# Patient Record
Sex: Male | Born: 1950 | Hispanic: No | Marital: Single | Smoking: Former smoker
Health system: Southern US, Community
[De-identification: ages and names within clinical notes are randomized; demographics above are authoritative.]

## PROBLEM LIST (undated history)

## (undated) DIAGNOSIS — R112 Nausea with vomiting, unspecified: Secondary | ICD-10-CM

## (undated) DIAGNOSIS — K219 Gastro-esophageal reflux disease without esophagitis: Secondary | ICD-10-CM

## (undated) DIAGNOSIS — K759 Inflammatory liver disease, unspecified: Secondary | ICD-10-CM

## (undated) DIAGNOSIS — R55 Syncope and collapse: Secondary | ICD-10-CM

## (undated) DIAGNOSIS — E785 Hyperlipidemia, unspecified: Secondary | ICD-10-CM

## (undated) DIAGNOSIS — R0602 Shortness of breath: Secondary | ICD-10-CM

## (undated) DIAGNOSIS — Z9889 Other specified postprocedural states: Secondary | ICD-10-CM

## (undated) DIAGNOSIS — J189 Pneumonia, unspecified organism: Secondary | ICD-10-CM

## (undated) DIAGNOSIS — T4145XA Adverse effect of unspecified anesthetic, initial encounter: Secondary | ICD-10-CM

## (undated) DIAGNOSIS — Z950 Presence of cardiac pacemaker: Secondary | ICD-10-CM

## (undated) DIAGNOSIS — I442 Atrioventricular block, complete: Secondary | ICD-10-CM

## (undated) DIAGNOSIS — I10 Essential (primary) hypertension: Secondary | ICD-10-CM

## (undated) DIAGNOSIS — IMO0002 Reserved for concepts with insufficient information to code with codable children: Secondary | ICD-10-CM

## (undated) DIAGNOSIS — F419 Anxiety disorder, unspecified: Secondary | ICD-10-CM

## (undated) HISTORY — PX: PACEMAKER INSERTION: SHX728

## (undated) HISTORY — DX: Atrioventricular block, complete: I44.2

## (undated) HISTORY — PX: BACK SURGERY: SHX140

## (undated) HISTORY — PX: OTHER SURGICAL HISTORY: SHX169

## (undated) HISTORY — DX: Anxiety disorder, unspecified: F41.9

## (undated) HISTORY — DX: Hyperlipidemia, unspecified: E78.5

## (undated) HISTORY — PX: ULNAR TUNNEL RELEASE: SHX820

## (undated) HISTORY — DX: Syncope and collapse: R55

## (undated) HISTORY — DX: Essential (primary) hypertension: I10

## (undated) HISTORY — DX: Reserved for concepts with insufficient information to code with codable children: IMO0002

## (undated) HISTORY — DX: Presence of cardiac pacemaker: Z95.0

---

## 1977-07-01 HISTORY — PX: CARPAL TUNNEL RELEASE: SHX101

## 1999-07-02 HISTORY — PX: CARDIAC CATHETERIZATION: SHX172

## 2006-01-08 ENCOUNTER — Ambulatory Visit: Payer: Self-pay | Admitting: Cardiology

## 2006-04-17 ENCOUNTER — Ambulatory Visit: Payer: Self-pay | Admitting: Cardiology

## 2006-05-15 ENCOUNTER — Ambulatory Visit: Payer: Self-pay | Admitting: Cardiology

## 2006-08-03 ENCOUNTER — Encounter: Payer: Self-pay | Admitting: Cardiology

## 2007-01-20 ENCOUNTER — Ambulatory Visit: Payer: Self-pay | Admitting: Cardiology

## 2007-05-22 ENCOUNTER — Ambulatory Visit: Payer: Self-pay | Admitting: Internal Medicine

## 2007-07-02 HISTORY — PX: SHOULDER ARTHROSCOPY DISTAL CLAVICLE EXCISION AND OPEN ROTATOR CUFF REPAIR: SHX2396

## 2007-11-04 ENCOUNTER — Ambulatory Visit: Payer: Self-pay

## 2007-12-11 ENCOUNTER — Encounter: Payer: Self-pay | Admitting: Cardiology

## 2007-12-11 ENCOUNTER — Ambulatory Visit: Payer: Self-pay | Admitting: Cardiology

## 2007-12-14 ENCOUNTER — Encounter: Admission: RE | Admit: 2007-12-14 | Discharge: 2007-12-14 | Payer: Self-pay | Admitting: Orthopedic Surgery

## 2008-02-24 ENCOUNTER — Encounter: Payer: Self-pay | Admitting: Cardiology

## 2008-03-03 ENCOUNTER — Ambulatory Visit: Payer: Self-pay | Admitting: Cardiology

## 2008-04-29 ENCOUNTER — Ambulatory Visit: Payer: Self-pay | Admitting: Internal Medicine

## 2008-07-01 HISTORY — PX: SHOULDER ARTHROSCOPY DISTAL CLAVICLE EXCISION AND OPEN ROTATOR CUFF REPAIR: SHX2396

## 2008-07-13 DIAGNOSIS — R55 Syncope and collapse: Secondary | ICD-10-CM

## 2008-07-13 DIAGNOSIS — Z950 Presence of cardiac pacemaker: Secondary | ICD-10-CM

## 2008-07-13 DIAGNOSIS — I447 Left bundle-branch block, unspecified: Secondary | ICD-10-CM

## 2008-07-13 DIAGNOSIS — E781 Pure hyperglyceridemia: Secondary | ICD-10-CM

## 2008-07-13 DIAGNOSIS — I10 Essential (primary) hypertension: Secondary | ICD-10-CM | POA: Insufficient documentation

## 2008-10-14 ENCOUNTER — Encounter (INDEPENDENT_AMBULATORY_CARE_PROVIDER_SITE_OTHER): Payer: Self-pay

## 2008-12-13 ENCOUNTER — Encounter: Payer: Self-pay | Admitting: Cardiology

## 2009-02-20 ENCOUNTER — Encounter (INDEPENDENT_AMBULATORY_CARE_PROVIDER_SITE_OTHER): Payer: Self-pay | Admitting: *Deleted

## 2009-02-24 ENCOUNTER — Ambulatory Visit: Payer: Self-pay | Admitting: Internal Medicine

## 2009-03-24 ENCOUNTER — Ambulatory Visit (HOSPITAL_COMMUNITY): Admission: RE | Admit: 2009-03-24 | Discharge: 2009-03-24 | Payer: Self-pay | Admitting: Neurosurgery

## 2009-05-02 ENCOUNTER — Inpatient Hospital Stay (HOSPITAL_COMMUNITY): Admission: RE | Admit: 2009-05-02 | Discharge: 2009-05-06 | Payer: Self-pay | Admitting: Neurosurgery

## 2009-06-29 ENCOUNTER — Encounter: Admission: RE | Admit: 2009-06-29 | Discharge: 2009-06-29 | Payer: Self-pay | Admitting: Orthopedic Surgery

## 2009-07-12 ENCOUNTER — Telehealth (INDEPENDENT_AMBULATORY_CARE_PROVIDER_SITE_OTHER): Payer: Self-pay | Admitting: *Deleted

## 2009-07-19 ENCOUNTER — Emergency Department (HOSPITAL_COMMUNITY): Admission: EM | Admit: 2009-07-19 | Discharge: 2009-07-19 | Payer: Self-pay | Admitting: Emergency Medicine

## 2009-09-22 ENCOUNTER — Ambulatory Visit: Payer: Self-pay | Admitting: Internal Medicine

## 2009-10-03 ENCOUNTER — Telehealth: Payer: Self-pay | Admitting: Internal Medicine

## 2010-03-23 ENCOUNTER — Ambulatory Visit: Payer: Self-pay | Admitting: Internal Medicine

## 2010-04-27 ENCOUNTER — Encounter (INDEPENDENT_AMBULATORY_CARE_PROVIDER_SITE_OTHER): Payer: Self-pay | Admitting: *Deleted

## 2010-07-21 ENCOUNTER — Encounter: Payer: Self-pay | Admitting: Cardiology

## 2010-07-22 ENCOUNTER — Encounter: Payer: Self-pay | Admitting: Orthopedic Surgery

## 2010-07-22 ENCOUNTER — Encounter: Payer: Self-pay | Admitting: Cardiology

## 2010-07-25 ENCOUNTER — Telehealth (INDEPENDENT_AMBULATORY_CARE_PROVIDER_SITE_OTHER): Payer: Self-pay | Admitting: *Deleted

## 2010-07-31 NOTE — Miscellaneous (Signed)
Summary: dx correction  Clinical Lists Changes  Problems: Changed problem from PACEMAKER (ICD-V45.Marland Kitchen01) to PACEMAKER, PERMANENT (ICD-V45.01) changed the incorrect dx code to correct dx code Genella Mech  April 27, 2010 12:27 PM

## 2010-07-31 NOTE — Cardiovascular Report (Signed)
Summary: [facility]/ INTERROGATION REPORT  Card Device Clinic/ INTERROGATION REPORT   Imported By: Dorise Hiss 09/25/2009 12:17:00  _____________________________________________________________________  External Attachment:    Type:   Image     Comment:   External Document

## 2010-07-31 NOTE — Progress Notes (Signed)
  Phone Note Other Incoming   Caller: Rosalita Chessman Reason for Call: Get patient information Action Taken: Information Sent Initial call taken by: Denny Peon    Faxed all Recent Cardiac Records [facility] fax [phone] [facility]  July 12, 2009 11:44 AM

## 2010-07-31 NOTE — Progress Notes (Signed)
Summary: problem with bp med  Phone Note Call from Patient   Summary of Call: BP reading after increase of Metoprolol done on 3/25 was 78/41 after one week of increased dose.  Did make him feel lightheaded when running this low.  Stopped taking extra half and BP reading running 133/90 to 115/75.  Currently BP 131/90.  HR 87    Initial call taken by: Hoover Brunette, LPN,  October 03, 2009 12:31 PM  Follow-up for Phone Call        Pt should resume 25mg  daily and follow-up with PCP. Follow-up by: Hillis Range, MD,  October 06, 2009 9:16 AM     Appended Document: problem with bp med Left message to return call.   Appended Document: problem with bp med Patient notified.   Pt. now states that the tablet was 100mg  tablet, thought is was a 50mg  tablet.  Now states his bp is 120/88.  HR 96.  Advised to cont. as currently doing and follow up with PMD as stated before.

## 2010-07-31 NOTE — Assessment & Plan Note (Signed)
Summary: PC2   Visit Type:  Pacemaker check Primary Provider:  Ernestine Conrad  CC:  pacer check.  History of Present Illness: The patient presents today for routine electrophysiology followup. He reports doing very well since last being seen in our clinic. His blood pressure has been elevated recently.  The patient denies symptoms of palpitations, chest pain, shortness of breath, orthopnea, PND, lower extremity edema, dizziness, presyncope, syncope, or neurologic sequela. The patient is tolerating medications without difficulties and is otherwise without complaint today.   Preventive Screening-Counseling & Management  Alcohol-Tobacco     Smoking Status: quit     Year Quit: 1979  Current Medications (verified): 1)  Lovaza 1 Gm Caps (Omega-3-Acid Ethyl Esters) .... Take 1 Capsule By Mouth Twice A Day 2)  Centrum Silver  Tabs (Multiple Vitamins-Minerals) .... Once Daily 3)  Welchol 625 Mg Tabs (Colesevelam Hcl) .... Three Tablets Two Times A Day 4)  Diazepam 10 Mg Tabs (Diazepam) .... Two Times A Day 5)  Prilosec 20 Mg Cpdr (Omeprazole) .... Once Daily 6)  Simvastatin 80 Mg Tabs (Simvastatin) .... Take One Tablet By Mouth Daily At Bedtime 7)  Lisinopril-Hydrochlorothiazide 20-25 Mg Tabs (Lisinopril-Hydrochlorothiazide) .... Once Daily 8)  Metoprolol Succinate 50 Mg Xr24h-Tab (Metoprolol Succinate) .... Take One-Half Tablet By Mouth Daily 9)  Albuterol Sulfate (2.5 Mg/78ml) 0.083% Nebu (Albuterol Sulfate) .... As Needed 10)  Ibuprofen 200 Mg Tabs (Ibuprofen) .... As Needed 11)  Paxil 20 Mg Tabs (Paroxetine Hcl) .... Take 1/2 Tablet By Mouth Once A Day 12)  Vitamin C 1000 Mg Tabs (Ascorbic Acid) .... Take 1 Tablet By Mouth Once A Day  Allergies (verified): 1)  ! Prednisone 2)  ! Codeine  Comments:  Nurse/Medical Assistant: The patient's medications and allergies were reviewed with the patient and were updated in the Medication and Allergy Lists. List reviewed.  Past History:  Past  Medical History: Complete heart block with prior syncope HYPERTENSION, BENIGN (ICD-401.1) PACEMAKER (ICD-V45.Marland Kitchen01)Medtronic Kappa 7/04 [place] Normal cath 2001 Anxiety Hyperlipidemia Remote tobacco DDD with multiple prior back surgeries  Past Surgical History: Reviewed history from 07/13/2008 and no changes required. cervical neck surgery  Social History: Reviewed history from 07/13/2008 and no changes required. Regular Exercise - yes Divorced  Tobacco Use - Former.  Alcohol Use - no  Review of Systems       All systems are reviewed and negative except as listed in the HPI.   Vital Signs:  Patient profile:   60 year old male Height:      65 inches Weight:      176 pounds BMI:     29.39 Pulse rate:   80 / minute BP sitting:   178 / 104  (left arm) Cuff size:   regular  Vitals Entered By: Carlye Grippe (September 22, 2009 2:25 PM)  Nutrition Counseling: Patient's BMI is greater than 25 and therefore counseled on weight management options. CC: pacer check   Physical Exam  General:  Well developed, well nourished, in no acute distress. Head:  normocephalic and atraumatic Mouth:  Teeth, gums and palate normal. Oral mucosa normal. Neck:  supple Chest Wall:  pacemaker site is well healed Lungs:  clear Heart:  RRR (paced) Abdomen:  Bowel sounds positive; abdomen soft and non-tender without masses, organomegaly, or hernias noted. No hepatosplenomegaly. Pulses:  pulses normal in all 4 extremities Extremities:  No clubbing or cyanosis. Neurologic:  nonfocal   PPM Specifications Following MD:  Sherryl Manges, MD     PPM Vendor:  Medtronic     PPM Model Number:  [id number]     PPM Serial Number:  [id number] PPM DOI:  01/07/2003     PPM Implanting MD:  Sherryl Manges, MD  Lead 1    Location: RA     DOI: 01/07/2003     Model #: [id number]     Serial #: [id number]     Status: active Lead 2    Location: RV     DOI: 01/07/2003     Model #: [id number]     Serial #: [id number]     Status:  active   Indications:  Syncope   PPM Follow Up Remote Check?  No Battery Voltage:  2.69 V     Battery Est. Longevity:  2 years     Pacer Dependent:  Yes       PPM Device Measurements Atrium  Amplitude: 2.0 mV, Impedance: 473 ohms, Threshold: 0.5 V at 0.4 msec Right Ventricle  Impedance: 580 ohms, Threshold: 1.0 V at 0.4 msec  Episodes MS Episodes:  0     Percent Mode Switch:  0     Coumadin:  No Ventricular High Rate:  0     Atrial Pacing:  6.4     Ventricular Pacing:  100%  Parameters Mode:  DDD     Lower Rate Limit:  60     Upper Rate Limit:  150 Paced AV Delay:  250     Sensed AV Delay:  140 Next Cardiology Appt Due:  03/01/2010 Tech Comments:  No parameter changes.  ROV 6 months in the [facility]. Altha Harm, LPN  September 22, 2009 3:02 PM  MD Comments:  agree  Impression & Recommendations:  Problem # 1:  AV BLOCK, COMPLETE (ICD-426.0) normal pacemaker function no changes  Problem # 2:  HYPERTENSION, BENIGN (ICD-401.1) above goal increase metoprolol to 50mg  daily follow-up with PCP  Problem # 3:  HYPERTRIGLYCERIDEMIA (ICD-272.1) stable His updated medication list for this problem includes:    Lovaza 1 Gm Caps (Omega-3-acid ethyl esters) .Marland Kitchen... Take 1 capsule by mouth twice a day    Welchol 625 Mg Tabs (Colesevelam hcl) .Marland Kitchen... Three tablets two times a day    Simvastatin 80 Mg Tabs (Simvastatin) .Marland Kitchen... Take one tablet by mouth daily at bedtime  Patient Instructions: 1)  return in 6 months

## 2010-07-31 NOTE — Cardiovascular Report (Signed)
Summary: Office Visit   Office Visit   Imported By: Roderic Ovens 03/30/2010 12:40:13  _____________________________________________________________________  External Attachment:    Type:   Image     Comment:   External Document

## 2010-07-31 NOTE — Assessment & Plan Note (Signed)
Summary: pacer check   Current Medications (verified): 1)  Centrum Silver  Tabs (Multiple Vitamins-Minerals) .... Once Daily 2)  Welchol 625 Mg Tabs (Colesevelam Hcl) .... Three Tablets Two Times A Day 3)  Diazepam 10 Mg Tabs (Diazepam) .... Two Times A Day 4)  Prilosec 20 Mg Cpdr (Omeprazole) .... Once Daily 5)  Simvastatin 80 Mg Tabs (Simvastatin) .... Take One Tablet By Mouth Daily At Bedtime 6)  Metoprolol Succinate 50 Mg Xr24h-Tab (Metoprolol Succinate) .... Take One-Half Tablet By Mouth Two Times A Day 7)  Albuterol Sulfate (2.5 Mg/71ml) 0.083% Nebu (Albuterol Sulfate) .... As Needed 8)  Paxil 20 Mg Tabs (Paroxetine Hcl) .... Take 1/2 Tablet By Mouth Once A Day 9)  Vitamin C 1000 Mg Tabs (Ascorbic Acid) .... Take 1 Tablet By Mouth Once A Day 10)  Clonidine Hcl 0.2 Mg Tabs (Clonidine Hcl) .... Take 1 Tablet By Mouth Three Times A Day (Or As Needed) 11)  Glucosamine Forte  Caps (Nutritional Supplements) .... Take 1 Capsule By Mouth Once Daily 12)  Fish Oil 1000 Mg Caps (Omega-3 Fatty Acids) .... Take 3 Capsules By Mouth Once Daily  Allergies (verified): 1)  ! Prednisone 2)  ! Codeine   PPM Specifications Following MD:  Sherryl Manges, MD     PPM Vendor:  Medtronic     PPM Model Number:  [id number]     PPM Serial Number:  [id number] PPM DOI:  01/07/2003     PPM Implanting MD:  Sherryl Manges, MD  Lead 1    Location: RA     DOI: 01/07/2003     Model #: [id number]     Serial #: [id number]     Status: active Lead 2    Location: RV     DOI: 01/07/2003     Model #: [id number]     Serial #: [id number]     Status: active   Indications:  Syncope   PPM Follow Up Battery Voltage:  2.69 V     Battery Est. Longevity:  22 MTHS     Pacer Dependent:  Yes       PPM Device Measurements Atrium  Amplitude: 4.00 mV, Impedance: 520 ohms, Threshold: 0.50 V at 0.40 msec Right Ventricle  Amplitude: PACED mV, Impedance: 576 ohms, Threshold: 0.750 V at 0.40 msec  Episodes MS Episodes:  1     Percent Mode Switch:  0%      Coumadin:  No Ventricular High Rate:  0     Atrial Pacing:  20.2%     Ventricular Pacing:  99.9%  Parameters Mode:  DDD     Lower Rate Limit:  60     Upper Rate Limit:  150 Paced AV Delay:  250     Sensed AV Delay:  140 Next Cardiology Appt Due:  09/03/2010 Tech Comments:  1 AHR EPISODE LASTING 9 SECONDS.  NORMAL DEVICE FUNCTION.  PACER DEPENDENT ON TODAYS CHECK.  NO CHANGES MADE.  ROV IN 6 MTHS W/JA IN [place]. Vella Kohler  March 23, 2010 3:55 PM

## 2010-08-02 NOTE — Progress Notes (Signed)
Summary: elevated bp  Phone Note Call from Patient   Summary of Call: Wanted to be seen by MD - elevated blood pressure.  States he saw his PMD and he wanted f/u by [place].  Advised pt that he would have to be seen as we have not seen him since 2009.  OV scheduled for 08/29/2010 at 9:15.  Advised him that if PMD feels that he needs to be seen sooner that this, he will need to contact office himself to make request.  Patient verbalized understanding.  Initial call taken by: Hoover Brunette, LPN,  July 25, 2010 4:41 PM

## 2010-08-29 ENCOUNTER — Ambulatory Visit (INDEPENDENT_AMBULATORY_CARE_PROVIDER_SITE_OTHER): Payer: Medicaid Other | Admitting: Cardiology

## 2010-08-29 ENCOUNTER — Encounter: Payer: Self-pay | Admitting: Cardiology

## 2010-08-29 ENCOUNTER — Encounter (INDEPENDENT_AMBULATORY_CARE_PROVIDER_SITE_OTHER): Payer: Self-pay | Admitting: *Deleted

## 2010-08-29 DIAGNOSIS — R0609 Other forms of dyspnea: Secondary | ICD-10-CM | POA: Insufficient documentation

## 2010-08-29 DIAGNOSIS — E782 Mixed hyperlipidemia: Secondary | ICD-10-CM

## 2010-08-29 DIAGNOSIS — R072 Precordial pain: Secondary | ICD-10-CM

## 2010-08-29 DIAGNOSIS — R0989 Other specified symptoms and signs involving the circulatory and respiratory systems: Secondary | ICD-10-CM | POA: Insufficient documentation

## 2010-09-03 ENCOUNTER — Encounter: Payer: Self-pay | Admitting: Cardiology

## 2010-09-03 DIAGNOSIS — R072 Precordial pain: Secondary | ICD-10-CM

## 2010-09-06 NOTE — Letter (Signed)
Summary: Lexiscan or Dobutamine Pharmacist, community at [facility]  [address]   [place], [place] [address]   Phone: [phone]  Fax: [phone]      [facility] Cardiovascular Services  Lexiscan or Dobutamine Cardiolite Strss Test    [facility] [place]  Appointment Date:_  Appointment Time:_  Your doctor has ordered a CARDIOLITE STRESS TEST using a medication to stimulate exercise so that you will not have to walk on the treadmill to determine the condition of your heart during stress. If you take blood pressure medication, ask your doctor if you should take it the day of your test. You should not have anything to eat or drink at least 4 hours before your test is scheduled, and no caffeine, including decaffeinated tea and coffee, chocolate, and soft drinks for 24 hours before your test.  You will need to register at the Outpatient/Main Entrance at the hospital 15 minutes before your appointment time. It is a good idea to bring a copy of your order with you. They will direct you to the Diagnostic Imaging (Radiology) Department.  You will be asked to undress from the waist up and given a hospital gown to wear, so dress comfortably from the waist down for example: Sweat pants, shorts, or skirt Rubber soled lace up shoes (tennis shoes)  Plan on about three hours from registration to release from the hospital

## 2010-09-06 NOTE — Assessment & Plan Note (Signed)
Summary: elevated bp, not seen since 2009  --agh   Primary Provider:  Ernestine Conrad   History of Present Illness: The patient is seen for an elevated blood pressure.  He did initially see his primary care physician.  We however not seen the patient since 2009 but the patient requested an office visit. Recent blood work demonstrates a creatinine of .84 and a potassium of 4.2.  Other electrolytes electrolytes were within normal limits.  CBC was also within normal limits.  Blood work was done approximate month ago.  The patient also had a chest x-ray done which showed no active disease.  A BNP level was done which was 110 and within normal range. The patient does have a pacemaker and has been followed by Dr. Johney Frame.  He has a dual-chamber Medtronic pacemaker.  The patient has a prior history of complete heart block with syncope. He catheterization in 2001 which was normal.  The patient does have a history of hypertension and dyslipidemia.  He also has very elevated triglycerides noted in the past. The patient had several recordings of his blood pressure at home.  It has now been running within normal limits.  He appears to have white coat hypertension.  He stated he took clonidine for awhile but is making very dizzy and nauseous significant drop his blood pressure to the point where he had to take his blood pressure 3 times a day.  He reports occasional sharp chest pains particularly when under stress they last only a few seconds to minutes.  There is no shortness of breath or diaphoresis.  However the patient has developed significant decrease in exercise tolerance shortness of breath on minimal exertion associated with some chest tightness. EKG with and without magnet was reviewed.  Normal AV sequential pacing with a heart rate of 85 beats/min with magnet.  Without magnet there is normal sinus activity with ventricular pacing at 64 beats/min.  Preventive Screening-Counseling &  Management  Alcohol-Tobacco     Smoking Status: quit     Year Quit: June 1979  Comments: Does state that he smokes marijuanna every evening.    Current Medications (verified): 1)  Welchol 625 Mg Tabs (Colesevelam Hcl) .... Three Tablets Two Times A Day 2)  Diazepam 10 Mg Tabs (Diazepam) .... Two Times A Day As Needed 3)  Prilosec 20 Mg Cpdr (Omeprazole) .... Once Daily 4)  Metoprolol Succinate 100 Mg Xr24h-Tab (Metoprolol Succinate) .... 1/2 Tab Every Morning 5)  Albuterol Sulfate (2.5 Mg/33ml) 0.083% Nebu (Albuterol Sulfate) .... As Needed 6)  Paxil 20 Mg Tabs (Paroxetine Hcl) .... Take 1/2 Tablet By Mouth Once A Day 7)  Vitamin C 1000 Mg Tabs (Ascorbic Acid) .... Take 1 Tablet By Mouth Once A Day 8)  Glucosamine Forte  Caps (Nutritional Supplements) .... Take 2 Tabs Every Morning & 1 Tab At Bedtime 9)  Fish Oil 1000 Mg Caps (Omega-3 Fatty Acids) .... Take 3 Capsules By Mouth Once Daily 10)  Multivitamins  Caps (Multiple Vitamin) .... Take 1 Tablet By Mouth Once A Day 11)  Flax Seed Oil 1000 Mg Caps (Flaxseed (Linseed)) .... Take 1 Tablet By Mouth Once A Day 12)  Ibuprofen 200 Mg Tabs (Ibuprofen) .... As Needed  Allergies: 1)  ! Prednisone 2)  ! Codeine 3)  ! * Nicotinic Acid  Past History:  Past Medical History: Last updated: 09/22/2009 Complete heart block with prior syncope HYPERTENSION, BENIGN (ICD-401.1) PACEMAKER (ICD-V45.Marland Kitchen01)Medtronic Kappa 7/04 [place] Normal cath 2001 Anxiety Hyperlipidemia Remote tobacco DDD with  multiple prior back surgeries  Past Surgical History: Last updated: 07/13/2008 cervical neck surgery  Family History: Last updated: 07/13/2008 Negative FH of Diabetes, Hypertension, or Coronary Artery Disease  Social History: Last updated: 07/13/2008 Regular Exercise - yes Divorced  Tobacco Use - Former.  Alcohol Use - no  Risk Factors: Smoking Status: quit (08/29/2010)  Review of Systems       The patient complains of shortness of  breath.  The patient denies fatigue, malaise, fever, weight gain/loss, vision loss, decreased hearing, hoarseness, chest pain, palpitations, prolonged cough, wheezing, sleep apnea, coughing up blood, abdominal pain, blood in stool, nausea, vomiting, diarrhea, heartburn, incontinence, blood in urine, muscle weakness, joint pain, leg swelling, rash, skin lesions, headache, fainting, dizziness, depression, anxiety, enlarged lymph nodes, easy bruising or bleeding, and environmental allergies.    Vital Signs:  Patient profile:   60 year old male Height:      65 inches Weight:      188.75 pounds BMI:     31.52 Pulse rate:   68 / minute BP sitting:   160 / 90  (left arm) Cuff size:   regular  Vitals Entered By: Hoover Brunette, LPN (August 29, 2010 9:36 AM) Is Patient Diabetic? No   Physical Exam  Additional Exam:  General: Well-developed, well-nourished in no distress head: Normocephalic and atraumatic eyes PERRLA/EOMI intact, conjunctiva and lids normal nose: No deformity or lesions mouth normal dentition, normal posterior pharynx neck: Supple, no JVD.  No masses, thyromegaly or abnormal cervical nodes lungs: Normal breath sounds bilaterally without wheezing.  Normal percussion heart: regular rate and rhythm with normal S1 and S2, no S3 or S4.  PMI is normal.  No pathological murmurs abdomen: Normal bowel sounds, abdomen is soft and nontender without masses, organomegaly or hernias noted.  No hepatosplenomegaly musculoskeletal: Back normal, normal gait muscle strength and tone normal pulsus: Pulse is normal in all 4 extremities Extremities: No peripheral pitting edema neurologic: Alert and oriented x 3 skin: Intact without lesions or rashes cervical nodes: No significant adenopathy psychologic: Normal affect    EKG  Procedure date:  08/29/2010  Findings:      EKG with magnet was reviewed.Normal AV sequential pacing with a heart rate of 85 beats/min with magnet.  Without magnet there  is normal sinus activity with ventricular pacing at 64 beats/min.  PPM Specifications Following MD:  Sherryl Manges, MD     PPM Vendor:  Medtronic     PPM Model Number:  [id number]     PPM Serial Number:  [id number] PPM DOI:  01/07/2003     PPM Implanting MD:  Sherryl Manges, MD  Lead 1    Location: RA     DOI: 01/07/2003     Model #: [id number]     Serial #: [id number]     Status: active Lead 2    Location: RV     DOI: 01/07/2003     Model #: [id number]     Serial #: [id number]     Status: active   Indications:  Syncope   PPM Follow Up Pacer Dependent:  Yes      Episodes Coumadin:  No  Parameters Mode:  DDD     Lower Rate Limit:  60     Upper Rate Limit:  150 Paced AV Delay:  250     Sensed AV Delay:  140  Impression & Recommendations:  Problem # 1:  AV BLOCK, COMPLETE (ICD-426.0) complete heart block status post pacemaker implantation.  The patient  needs an electrocardiogram today. His updated medication list for this problem includes:    Metoprolol Succinate 100 Mg Xr24h-tab (Metoprolol succinate) .Marland Kitchen... 1/2 tab every morning  Problem # 2:  HYPERTENSION, BENIGN (ICD-401.1) hypertension: The patient requested an office visit for further evaluation of his blood pressure.  The patient blood pressure however is now controlled.  It appears to have whitecoat hypertension.  His blood pressure stable on beta-blocker.  The following medications were removed from the medication list:    Clonidine Hcl 0.2 Mg Tabs (Clonidine hcl) .Marland Kitchen... Take 1 tablet by mouth three times a day (or as needed) His updated medication list for this problem includes:    Metoprolol Succinate 100 Mg Xr24h-tab (Metoprolol succinate) .Marland Kitchen... 1/2 tab every morning  Problem # 3:  HYPERTRIGLYCERIDEMIA (ICD-272.1) hyperlipidemia hypertriglyceridemia: Severe, will check a lipid panel and liver function tests. normal cardiac catheterization 2001. The following medications were removed from the medication list:    Simvastatin 80 Mg Tabs  (Simvastatin) .Marland Kitchen... Take one tablet by mouth daily at bedtime His updated medication list for this problem includes:    Welchol 625 Mg Tabs (Colesevelam hcl) .Marland Kitchen... Three tablets two times a day  Orders: T-Lipid Profile (16109-60454) T-Hepatic Function (09811-91478)  Problem # 4:  DYSPNEA ON EXERTION (ICD-786.09) Dyspnea and exertion: The patient has multiple cardiac risk factors.  Although his catheterization was normal in 2001.  He will be referred for Lexiscan. Marijuana use, no tobacco use. His updated medication list for this problem includes:    Metoprolol Succinate 100 Mg Xr24h-tab (Metoprolol succinate) .Marland Kitchen... 1/2 tab every morning  Other Orders: EKG w/ Interpretation (93000) Nuclear Med (Nuc Med)  Patient Instructions: 1)  Lexiscan on meds 2)  Labs:  fasing lipid panel & liver function 3)  Follow up in  6 months

## 2010-09-07 ENCOUNTER — Encounter (INDEPENDENT_AMBULATORY_CARE_PROVIDER_SITE_OTHER): Payer: Self-pay | Admitting: *Deleted

## 2010-09-11 NOTE — Letter (Signed)
Summary: Engineer, materials at [facility]  [address]   [place], [place] [address]   Phone: [phone]  Fax: [phone]        September 07, 2010 MRN: [id number]   Jason Yu [address] [place], [place]  [address]   Dear Mr. [name],  Your test ordered by Selena Batten has been reviewed by your physician (or physician assistant) and was found to be normal or stable. Your physician (or physician assistant) felt no changes were needed at this time.  ____ Echocardiogram  __X__ Cardiac Stress Test  ____ Lab Work  ____ Peripheral vascular study of arms, legs or neck  ____ CT scan or X-ray  ____ Lung or Breathing test  ____ Other:   Thank you.   Hoover Brunette, LPN    Duane Boston, M.D., F.A.C.C. Thressa Sheller, M.D., F.A.C.C. Oneal Grout, M.D., F.A.C.C. Cheree Ditto, M.D., F.A.C.C. Daiva Nakayama, M.D., F.A.C.C. Kenney Houseman, M.D., F.A.C.C. Jeanne Ivan, PA-C

## 2010-09-16 LAB — CBC
HCT: 40.2 % (ref 39.0–52.0)
MCV: 94.2 fL (ref 78.0–100.0)
Platelets: 223 10*3/uL (ref 150–400)
RBC: 4.26 MIL/uL (ref 4.22–5.81)
WBC: 11.6 10*3/uL — ABNORMAL HIGH (ref 4.0–10.5)

## 2010-09-16 LAB — BASIC METABOLIC PANEL
CO2: 28 mEq/L (ref 19–32)
Chloride: 101 mEq/L (ref 96–112)
Creatinine, Ser: 0.74 mg/dL (ref 0.4–1.5)
Glucose, Bld: 130 mg/dL — ABNORMAL HIGH (ref 70–99)

## 2010-09-16 LAB — DIFFERENTIAL
Eosinophils Absolute: 0 10*3/uL (ref 0.0–0.7)
Eosinophils Relative: 0 % (ref 0–5)
Lymphs Abs: 1.2 10*3/uL (ref 0.7–4.0)
Monocytes Relative: 5 % (ref 3–12)

## 2010-10-04 LAB — BASIC METABOLIC PANEL
BUN: 11 mg/dL (ref 6–23)
Calcium: 10.1 mg/dL (ref 8.4–10.5)
GFR calc non Af Amer: >60 mL/min (ref >60)
Glucose, Bld: 103 mg/dL — ABNORMAL HIGH (ref 70–99)

## 2010-10-04 LAB — CBC
MCHC: 34.7 g/dL (ref 30.0–36.0)
RDW: 13.4 % (ref 11.5–15.5)

## 2010-10-04 LAB — TYPE AND SCREEN
ABO/RH(D): O POS
Antibody Screen: NEGATIVE

## 2010-11-01 ENCOUNTER — Other Ambulatory Visit (HOSPITAL_COMMUNITY): Payer: Self-pay | Admitting: Neurosurgery

## 2010-11-01 DIAGNOSIS — M549 Dorsalgia, unspecified: Secondary | ICD-10-CM

## 2010-11-13 NOTE — Assessment & Plan Note (Signed)
Jason Yu                          [facility]   Jason Yu, Jason Yu                    MRN:          [id number]  DATE:12/11/2007                            DOB:          1951-06-09    PRIMARY CARE PHYSICIAN:  Dierdre Highman. Loney Hering, MD   PRIMARY CARDIOLOGIST:  Learta Codding, MD, [facility]   REASON FOR VISIT:  Followup of hypertriglyceridemia.   HISTORY OF PRESENT ILLNESS:  Jason Yu had recent blood work done by  Dr. Loney Hering, demonstrating a total cholesterol of 237, triglycerides up to  975 from July 2005 and an HDL of 29.  He has been on reasonable doses of  both WelChol and simvastatin.  He was concerned about the results and  referred in to discuss this with [place].  We talked about diet today and  avoiding simple carbohydrates, preferably focusing more on fiber and  complex carbohydrates.  He states that he has been trying to do this  anyway.  He reports compliance with his medications including taking  WelChol 6 tablets daily.  He has done this for the last 2 months.  He  has never tried omega-3 supplements and we talked about this today.  Otherwise, he has been stable.  His electrocardiogram shows an AV  sequential paced rhythm.  He saw Dr. Graciela Husbands for pacemaker followup in  November 2008.   ALLERGIES:  CODEINE, PREDNISONE, and NICOTINIC ACID.   PRESENT MEDICATIONS:  1. Vitamin C 1000 mg p.o. daily.  2. Centrum Silver daily.  3. Ibuprofen.  4. WelChol 625 mg 3 tablets p.o. b.i.d.  5. Diazepam 10 mg p.o. b.i.d.  6. Felodipine 5 mg p.o. daily.  7. Omeprazole 20 mg p.o. daily.  8. Diovan HCT 320/25 mg p.o. daily.  9. Simvastatin 80 mg p.o. nightly.  10.Metoprolol 100 mg p.o. daily.  11.Tramadol 50 mg p.o. b.i.d. to t.i.d.  12.Albuterol p.r.n.   REVIEW OF SYSTEMS:  As described in the history of present illness.  No  tendon pain or other eruptions.  No abdominal pain.  Otherwise negative.   PHYSICAL EXAMINATION:  Blood pressure is  126/81, heart rate is 100, and  weight is 190 pounds.  The patient is in no acute distress.  HEENT:  Conjunctivae normal.  Oropharynx is clear.  NECK:  Supple.  No elevated jugular venous pressure.  No audible bruits.  LUNGS:  Clear without labored breathing.  CARDIAC:  Regular rate and rhythm.  No S3 or gallop.  Pacer pocket site  in the left upper thorax is stable.  ABDOMEN:  Soft and nontender.  No guarding.  No rebound.  Bowel sounds  present.  EXTREMITIES:  No frank pitting edema.  Distal pulses are 1-2+.  SKIN:  Warm and dry.  MUSCULOSKELETAL:  No kyphosis noted.  NEUROPSYCHIATRIC:  The patient is alert and oriented x3.  Affect is  appropriate.   IMPRESSION AND RECOMMENDATIONS:  Progressive hypertriglyceridemia  despite adequate therapy with WelChol and simvastatin.  The patient  reports compliance with his medications.  I reminded him about  appropriate dietary measures and avoiding simple carbohydrates.  We  talked about adding an omega-3 supplement to his regimen, and he  preferred prescription Lovaza as a trial.  We will begin this at 1 g  twice daily with a goal of increasing to a total of 4 g a day.  He will  have followup liver function and lipids over the next 8 weeks and then  see Dr. Andee Lineman back at that time.     Jason Sidle, MD  Electronically Signed    SGM/MedQ  DD: 12/11/2007  DT: 12/11/2007  Job #: [id number]   cc:   Learta Codding, MD,FACC  Dierdre Highman. Loney Hering, MD

## 2010-11-13 NOTE — Letter (Signed)
May 22, 2007    Xaje Hasanaj  [address]  [place], [place] [address]   RE:  Jason, Yu  MRN:  [id number]  /  DOB:  12-Aug-1950   Dear Dr. Olena Leatherwood,   Jason Yu is new to me.  He is a Administrator, [employer].  He has a history of left bundle  branch block and intermittent syncope.  He underwent pacemaker  implantation in 2004 in [place] receiving a Medtronic Kappa 701 pulse  generator.  He has had no recurrent syncope although he has had some  presyncope and for some reason rate drop response was activated to  counter that.   The patient's history is also  notable for some cervical neck surgery  which has resulted in bilateral hand dysfunction, thenar wasting, etc.   The patient is still really quite active.  He goes ATVing and he goes  off-roading, etc.   His blood pressure has been an issue.  His metoprolol was discontinued  the other day by Dr. Olena Leatherwood with abrupt decrease from 100 mg to 0.  His  blood pressure since then has been elevated at 164/101.  His lungs were  clear.  Heart sounds were regular.  Extremities were without edema.  His  neurological exam was grossly abnormal in his upper extremities, as  noted.  There is also upper extremity swelling bilaterally.   On interrogation of his Medtronic Kappa 701 pulse generator he has P  waves of 5.6 with impedance of 556, threshold of 0.5 at 0.4.  He was  ventricularly paced at 30.  The impedance was 616 and threshold was 1  volt at 0.4.  He has no intrinsic rhythm.  At this point his battery  voltage is 2.75.   IMPRESSION:  1. 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  2. Status post pacer for the above.  3. Syncope secondary to the above, now without recurrence.  4. Hypertension with recent abrupt discontinuation of his beta blocker      and the initiation of Diovan.   Jason Yu, Dr. Olena Leatherwood, is doing well from an arrhythmia point of  view.  His pacemaker is functioning normally.   I wonder whether his hypertensive  response over the last couple of days,  and some of his other somatic complaints, are related to the abrupt  discontinuation of his metoprolol and I have taken the liberty of  putting him back on metoprolol at 50 mg a day.   He is to follow up with you about his blood pressure and we will see him  again in six month's time for his pacemaker followup.    Sincerely,      Duke Salvia, MD, [facility]  Electronically Signed    SCK/MedQ  DD: 05/22/2007  DT: 05/23/2007  Job #: [id number]

## 2010-11-13 NOTE — Assessment & Plan Note (Signed)
[facility]                          [facility]ICE NOTE   SHONDALE, QUINLEY                    MRN:          [id number]  DATE:03/03/2008                            DOB:          1950/09/25    CARDIOLOGIST:  Learta Codding, MD, [facility]   PRIMARY CARE PHYSICIAN:  Ernestine Conrad, MD   REASON FOR VISIT:  Three-month followup.   HISTORY OF PRESENT ILLNESS:  Mr. Kronenberger is a 60 year old male patient  with a history of syncope in the setting complete heart block, status  post permanent pacemaker implantation, who has a history of a normal  cardiac catheterization in 2001 in [place].  He also has a history of  hypertension and dyslipidemia.  The patient saw Dr. Diona Browner in Dr.  Margarita Mail absence back in June 2009.  His triglycerides were noted to be  quite elevated and Dr. Diona Browner placed him on Lovaza, in addition to his  WelChol and simvastatin.  He had follow up blood work done recently that  revealed improved triglycerides at 200, down from 975, total cholesterol  179, HDL 33, and LDL 106.  He notes in the office that he is doing well.  He denies chest pain, shortness of breath, syncope, or near syncope.  He  has been tracking his blood pressure well.  He noted that his blood  pressure was somewhat low recently.  He recorded some readings in the 80  range systolically.  He was feeling near syncopal with this.  He stopped  metoprolol on his own and his blood pressure recovered and his symptoms  abated.   MEDICATIONS:  Vitamin C, Centrum Silver, WelChol 625 mg 3 tablets  b.i.d., diazepam 10 mg 1 tablet b.i.d., omeprazole 20 mg daily,  simvastatin 80 mg nightly, Lovaza 1 gram 2 tablets daily, lisinopril and  hydrochlorothiazide 10/?  mg half tablet daily.   PHYSICAL EXAMINATION:  GENERAL:  He is a well-nourished well-developed  male, in no acute distress.  VITAL SIGNS:  Blood pressure 144/98, repeat blood pressure by me is  140/90 on the left,  pulse 103, recheck on his pulse by me was 64 by  manual palpation, weight 182.2 pounds.  HEENT:  Normal.  NECK:  Without JVD.  Lymph node without lymphadenopathy.  CARDIAC:  Normal S1 and S2.  Regular rate and rhythm.  LUNGS:  Clear to auscultation bilaterally soft and nontender.  EXTREMITIES:  No edema.  NEUROLOGIC:  He is alert and oriented x3.  Cranial nerves II through XII  are grossly intact.  VASCULAR:  Sounds of her carotid bruits bilaterally.   ASSESSMENT AND PLAN:  1. Hypertriglyceridemia on triple therapy with WelChol, simvastatin,      and Lovaza.  His recent blood work shows marked improvement in his      numbers.  We had a long discussion today regarding further      treatment.  Initially, we were going to increase his Lovaza, but      instead I have decided to keep him on his current dose and he will      work on changing his diet.  I have recommended the [employer]      to him today.  We talked about good carbs versus bad carbs and also      increasing his activity.  He denies any heavy alcohol use.  2. Hypertension.  He recently noted some hypotension and stopped his      metoprolol on his own.  He took some ibuprofen today before coming      to our office.  This probably would explain his borderline high      blood pressure today.  I have asked him to keep a close eye on his      blood pressures over the next several weeks, and he should contact      [place] if his pressures are remaining above 140/90.  3. Status post pacemaker, in the setting of syncope and complete heart      block.  He will continue to follow up with Dr. Graciela Husbands as indicated.   Disposition:  The patient will be brought back in followup in the next 6  months or sooner p.r.n.  We will obtain lipids and LFTs at 3 months from  now and review those as followup visit.      Tereso Newcomer, PA-C  Electronically Signed      Learta Codding, MD,FACC  Electronically Signed   SW/MedQ  DD: 03/03/2008   DT: 03/04/2008  Job #: [id number]   cc:   Ernestine Conrad, MD

## 2010-11-13 NOTE — Cardiovascular Report (Signed)
[facility]                   [facility]   MARKAS, ALDREDGE                    MRN:          [id number]  DATE:05/22/2007                            DOB:          [birth year]    Jason Yu is new to me.  He is a Administrator, [employer].  He has a history of left bundle  branch block and intermittent syncope.  He underwent pacemaker  implantation in 2004 in [place] receiving a Medtronic Kappa 701 pulse  generator.  He has had no recurrent syncope although he has had some  presyncope and for some reason rate drop response was activated to  counter that.   The patient's history is also notable for some cervical neck surgery  which has resulted in bilateral hand dysfunction, thenar wasting, etc.   The patient is still really quite active.  He goes  ATVing.  He goes off-  roading, etc.   His blood pressure has been an issue.  His metoprolol was discontinued  the other day by Dr. Olena Leatherwood with abrupt decrease from 100 mg to 0.  His  blood pressure since then has been elevated at 164/101.  His lungs were  clear.  Heart sounds were regular. Extremities were without edema.  His  neurological exam was grossly abnormal in his upper extremities, as  noted.  There is also upper extremity swelling bilaterally.   On interrogation of is Medtronic Kappa 701 pulse generator this  demonstrated P waves of 5.6 with impedance of 556 with threshold of 0.5  at 0.4.  He was ventricularly paced at 30.  The impedance was 616 and  threshold was 1 volt at 0.4.  He has no intrinsic rhythm.  At this point  his battery voltage is 2.75.   IMPRESSION:  1. Left bundle branch block, now complete heart block.  2. Status post pacer for the above.  3. Syncope secondary to the above, now without recurrence.  4. Hypertension with recent abrupt discontinuation of his beta blocker      and the initiation of Diovan.   Mr. Mandley, Dr. Olena Leatherwood, is doing well from an  arrhythmia point of  view.  His pacemaker is functioning normally.   I wonder whether his hypertensive response over the last couple of days  and some of his other somatic complaints are related to the abrupt  discontinuation of his metoprolol and I have taken the liberty of  putting him back on metoprolol at 50 mg a day.   He is to follow up with you about his blood pressure and we will see him  again in six month's time for his pacemaker followup.     Duke Salvia, MD, [facility]  Electronically Signed    SCK/MedQ  DD: 05/22/2007  DT: 05/23/2007  Job #: [id number]   cc:   Lia Hopping

## 2010-11-13 NOTE — Assessment & Plan Note (Signed)
[facility]                          [facility]   Jason Yu, Jason Yu                    MRN:          [id number]  DATE:01/20/2007                            DOB:          22-Feb-1951    REFERRING PHYSICIAN:  Annette Stable Hasanaj   HISTORY OF PRESENT ILLNESS:  Patient is a 60 year old male with a  history of Medtronic dual-chamber pacemaker implantation.  Patient had a  normal catheterization in February, 2001.  The patient presented  previously with syncope.  He has now been doing well.  He said he had an  adjustment to his pacemaker done, and when he was riding his four-wheel  drive, he became presyncopal.  He then went back to our [place] office  in [place], and pacemaker resettings were brought back to the  original settings.  He has had no recurrent problems.   The patient states he is under a significant amount of stress, as his ex-  wife took off with the children after he thought they were getting back  together.  The patient denies any other cardiovascular complications.  He does report smoking pot for the last 38 years.   MEDICATIONS:  Vitamin C 1000 mg daily, Centrum Silver, ibuprofen 600 mg  p.o. daily, Welchol 625 b.i.d., diazepam 10 mg p.o. b.i.d., felodipine 5  mg p.o. daily, metoprolol 100 mg p.o. daily, tramadol 25 mg p.o. b.i.d.,  omeprazole 20 mg p.o. daily, simvastatin 80 mg p.o. nightly.   PHYSICAL EXAMINATION:  VITAL SIGNS:  Blood pressure 156/99, heart rate  85, weight 174 pounds.  NECK:  Normal carotid upstrokes.  No carotid bruits.  LUNGS:  Clear breath sounds bilaterally.  HEART:  Regular rate and rhythm.  Normal S1 and S2.  ABDOMEN:  Soft.  EXTREMITIES:  No clubbing, cyanosis or edema.  NEURO:  Patient is alert and oriented, grossly nonfocal.   PROBLEM LIST:  1. History of syncope.      a.     Status post Medtronic dual-chamber pacemaker implantation.      b.     Normal sinus rhythm, ventricular  pacing.      c.     History of normal catheterization in 2001.      d.     History of left bundle branch block.  2. Hypertension.  3. Dyslipidemia.  4. Remote tobacco use.  5. Anxiety disorder.   PLAN:  1. Patient is doing quite well from a cardiovascular perspective.  No      further adjustments are needed in his medication, although I am      concerned that he might be hypertensive.  2. Patient will have home readings done a couple of times a week and      will bring those to our practice in the next couple of weeks.  If      his blood pressure is elevated, we will start him on      hydrochlorothiazide 25 mg p.o. daily in addition to his felodipine,      which can be further up-titrate as needed.  3. The patient can follow up with  [place] in one year.     Learta Codding, MD,FACC  Electronically Signed    GED/MedQ  DD: 01/20/2007  DT: 01/21/2007  Job #: [id number]   cc:   Lia Hopping

## 2010-11-15 ENCOUNTER — Ambulatory Visit (HOSPITAL_COMMUNITY)
Admission: RE | Admit: 2010-11-15 | Discharge: 2010-11-15 | Disposition: A | Discharge status: Home / Self Care | Payer: Medicaid Other | Source: Ambulatory Visit | Attending: Neurosurgery | Admitting: Neurosurgery

## 2010-11-15 DIAGNOSIS — M542 Cervicalgia: Secondary | ICD-10-CM | POA: Insufficient documentation

## 2010-11-15 DIAGNOSIS — M549 Dorsalgia, unspecified: Secondary | ICD-10-CM

## 2010-11-15 DIAGNOSIS — M47814 Spondylosis without myelopathy or radiculopathy, thoracic region: Secondary | ICD-10-CM | POA: Insufficient documentation

## 2010-11-15 DIAGNOSIS — M546 Pain in thoracic spine: Secondary | ICD-10-CM | POA: Insufficient documentation

## 2010-11-15 DIAGNOSIS — M545 Low back pain, unspecified: Secondary | ICD-10-CM | POA: Insufficient documentation

## 2010-11-15 DIAGNOSIS — Z981 Arthrodesis status: Secondary | ICD-10-CM | POA: Insufficient documentation

## 2010-11-15 DIAGNOSIS — M4120 Other idiopathic scoliosis, site unspecified: Secondary | ICD-10-CM | POA: Insufficient documentation

## 2010-11-15 MED ORDER — IOHEXOL 300 MG/ML  SOLN
10.0000 mL | Freq: Once | INTRAMUSCULAR | Status: AC | PRN
  Administered 2010-11-15: 10 mL via INTRATHECAL

## 2010-11-16 NOTE — Assessment & Plan Note (Signed)
[facility]                            [facility] NOTE   Jason, Yu                        MRN:          [id number]  DATE:01/08/2006                            DOB:          1950-12-16    Jason Yu is a 60 year old male, prior resident of [place], who has now  relocated back here in [place] and is now presenting to establish with a  cardiologist.   The patient's cardiac history is notable for placement of a Medtronic dual  chamber pacemaker in July 2004 (records are currently pending) for apparent  treatment of recurrent syncope and finding of a new left bundle branch  block.   The patient has undergone extensive workup and review of his records  indicate an echocardiogram in 2001, revealing moderate concentric left  ventricular hypertrophy with mild mitral regurgitation, negative carotid  Doppler studies, and a cardiac catheterization in February 2001, revealing  normal coronary arteries with preserved left ventricular function (55%) with  mild inferior hypokinesis.   The patient also has hypertension and hyperlipidemia, currently on  medication for both, and followed closely by Dr. Olena Leatherwood.   The patient reports to me today that he has not had any recurrent syncope  since placement of his pacemaker in July 2004.  It was last interrogated a  little over a year ago in [place].   In summary, [name] presents to establish with a new cardiologist here  at our [facility] and is reporting no complaints of exertional chest  discomfort, dyspnea, or tachy palpations.  He has no presyncope/syncope, and  is able to lead a very active life with all sorts of various physical  activities.   Electrocardiogram today reveals atrial pacing with ventricular sensing at 60  BMP.   CURRENT MEDICATIONS:  1.  Ibuprofen 600 every day.  2.  Welchol 625 b.i.d.  3.  Diazepam 10 b.i.d.  4.  Felodipine 5 every day.  5.  Metoprolol 100 every  day.  6.  Tramadol 25 b.i.d.  7.  Omeprazole 20 every day.  8.  Simvastatin 80 every day.   PAST MEDICAL HISTORY:  1.  Cardiac history as outlined above.  2.  Status post cervical neck fusion (C5-C7).  3.  Bilateral carpal tunnel syndrome.  4.  Anxiety disorder/panic attacks.  5.  Asthma.  6.  Surgery of the left hand and elbow.  7.  The patient has also been on permanent disability for approximately the      last 2 years.   ALLERGIES:  1.  CODEINE.  2.  PREDNISONE.   SOCIAL HISTORY:  The patient has relocated back here to [facility], since December  2006.  He lives alone.  He was previously married and had subsequently been  living with a girlfriend for approximately 8 years before she died secondary  to heart complications, approximately 2 years ago.  He has no children.  He  has not smoked tobacco since 1979.  He denies alcohol use.   FAMILY HISTORY:  Noncontributory.   REVIEW OF SYSTEMS:  As noted per HPI, otherwise negative.  PHYSICAL EXAMINATION:  VITAL SIGNS:  Blood pressure 140/100, pulse 60  regular, weight 173.  GENERAL:  This is a 60 year old male in no apparent distress.  HEENT:  Normocephalic atraumatic.  NECK:  Palpable carotid pulses without bruits.  No JVD.  LUNGS:  Clear to auscultation all fields.  HEART:  Regular rate and rhythm (S1 S2).  No murmurs, rubs, or gallops.  ABDOMEN:  Soft, nontender with intact bowel sounds.  EXTREMITIES:  Palpable femoral pulses without bruits.  Intact distal pulses  with no significant edema.  NEUROLOGIC:  No focal deficit.   IMPRESSION:  1.  History of recurrent syncope.      1.  Status post Medtronic dual chamber pacemaker implantation, July          2004.  2.  History of normal cardiac catheterization, February 2001.  3.  Left bundle branch block.  4.  Hypertension.  5.  Hyperlipidemia.  6.  Remote tobacco.  7.  Anxiety disorder.  8.  Disability.   PLAN:  The patient is stable from a cardiac perspective with no  current  signs of symptoms suggestive of unstable angina pectoris or congestive heart  failure.  He has had no further syncopal episodes since undergoing  successful placement of a permanent pacemaker in 2004.  He does, however,  need to establish with one of our electrophysiologists for continued  monitoring of his pacemaker.  We will, therefore, schedule him to return to  our [facility] to  establish with Dr. Sherryl Manges for future monitoring of his pacemaker.  From a general cardiology perspective, he will establish with Dr. Andee Lineman and  will follow up with him in 1 year.                                   Gene Serpe, PA-C                                Learta Codding, MD, [facility]   GS/MedQ  DD:  01/08/2006  DT:  01/08/2006  Job #:  [id number]   cc:   Lia Hopping

## 2010-11-16 NOTE — Assessment & Plan Note (Signed)
[facility]                           ELECTROPHYSIOLOGY OFFICE NOTE   LANDY, DUNNAVANT                    MRN:          [id number]  DATE:04/17/2006                            DOB:          [birth year]    Mr. Longsworth is seen in device clinic today, April 17, 2006, in the [place]  office, to establish followup of his Medtronic Kappa 700 dual-chamber  pacemaker implanted July 2004 for syncope in [place].  His device has not  been checked for approximately a year and a half.  Upon interrogation,  battery voltage is 2.76 volts.  He paces approximately 5% of the time in the  ventricle.  In the atrium, amplitude is 2.6 mV, with a threshold of 0.75 V  at 0.4 msec, and in the right ventricle intrinsic amplitude is greater than  31.36 mV, with a threshold of 1 V at 0.4 msec.  Adaptic capture was turned  on in the ventricle, and amplitudes were changed, decreased to 2 in the  atrium and 2.5 in the ventricle per protocol.  Sensed AV delay was also  increased to 250 msec to allow intrinsic conduction maximally.  He will be  seen in 6 months' time for followup and subsequently every 6 months in the  office after that, as he does not have phone service at this time.      ______________________________  Cleatrice Burke, RN    ______________________________  Duke Salvia, MD, [facility]    CF/MedQ  DD:  04/17/2006  DT:  04/18/2006  Job #:  [id number]

## 2010-12-28 ENCOUNTER — Encounter: Payer: Self-pay | Admitting: *Deleted

## 2011-01-07 ENCOUNTER — Encounter: Payer: Self-pay | Admitting: Cardiology

## 2011-01-24 ENCOUNTER — Encounter: Payer: Self-pay | Admitting: Internal Medicine

## 2011-01-24 ENCOUNTER — Ambulatory Visit (INDEPENDENT_AMBULATORY_CARE_PROVIDER_SITE_OTHER): Payer: Medicaid Other | Admitting: Internal Medicine

## 2011-01-24 DIAGNOSIS — I442 Atrioventricular block, complete: Secondary | ICD-10-CM

## 2011-01-24 DIAGNOSIS — I10 Essential (primary) hypertension: Secondary | ICD-10-CM

## 2011-01-24 NOTE — Assessment & Plan Note (Signed)
Normal pacemaker function See Arita Miss Art report No changes today  He is approaching ERI He has a Kappa device and therefore needs close follow-up He will return to the device clinic in 3 months.

## 2011-01-24 NOTE — Assessment & Plan Note (Signed)
He reports good BP control at home He is reluctant to make changes today.

## 2011-01-24 NOTE — Progress Notes (Signed)
The patient presents today for routine electrophysiology followup.  Since last being seen in our clinic, the patient reports doing very well.  Today, he denies symptoms of palpitations, chest pain, shortness of breath, orthopnea, PND, lower extremity edema, dizziness, presyncope, syncope, or neurologic sequela.  The patient feels that he is tolerating medications without difficulties and is otherwise without complaint today.   Past Medical History  Diagnosis Date  . Complete heart block     with prior syncope s/p PPM 2004 in [place]  . HTN (hypertension), benign   . Pacemaker     medtronic kappa. 7/04 [place]   . Hx of cardiac catheterization     normal. 2001  . Anxiety   . HLD (hyperlipidemia)   . DDD (degenerative disc disease)     with multiple prior back surgeries    Past Surgical History  Procedure Date  . Cervical neck surgeries   . Pacemaker insertion     Implanted 2004 in [place]    Current Outpatient Prescriptions  Medication Sig Dispense Refill  . albuterol (PROVENTIL) (2.5 MG/3ML) 0.083% nebulizer solution Take 2.5 mg by nebulization every 6 (six) hours as needed.        . Ascorbic Acid (VITAMIN C) 1000 MG tablet Take 1,000 mg by mouth daily.        . colesevelam (WELCHOL) 625 MG tablet Take 1,875 mg by mouth 2 (two) times daily.        . diazepam (VALIUM) 10 MG tablet Take 10 mg by mouth 2 (two) times daily.        . Flaxseed, Linseed, (FLAX SEED OIL) 1000 MG CAPS Take 1 capsule by mouth daily.        Marland Kitchen ibuprofen (ADVIL,MOTRIN) 200 MG tablet Take 200 mg by mouth every 6 (six) hours as needed.        . metoprolol (TOPROL-XL) 100 MG 24 hr tablet Take 50 mg by mouth every morning.        . Multiple Vitamin (MULTIVITAMIN) tablet Take 1 tablet by mouth daily.        . Nutritional Supplements (GLUCOSAMINE FORTE) CAPS Take by mouth 2 (two) times daily. Take 2 capsules in the am and 1 at bedtime       . Omega-3 Fatty Acids (FISH OIL) 1000 MG CAPS Take 3 capsules by mouth daily.         Marland Kitchen omeprazole (PRILOSEC) 20 MG capsule Take 20 mg by mouth daily.        Marland Kitchen PARoxetine (PAXIL) 20 MG tablet Take 10 mg by mouth daily.          Allergies  Allergen Reactions  . Codeine     REACTION: rash,itch  . Prednisone     REACTION: destroy bones per patient    History   Social History  . Marital Status: Divorced    Spouse Name: N/A    Number of Children: N/A  . Years of Education: N/A   Occupational History  . Not on file.   Social History Main Topics  . Smoking status: Former Games developer  . Smokeless tobacco: Not on file  . Alcohol Use: No  . Drug Use: Yes     smokes occasional marijuana, no ready to quit  . Sexually Active: Not on file   Other Topics Concern  . Not on file   Social History Narrative   Lives alone    Family History  Problem Relation Age of Onset  . Diabetes Neg Hx   .  Hypertension Neg Hx   . Coronary artery disease Neg Hx     ROS-  All systems are reviewed and are negative except as outlined in the HPI above    Physical Exam: Filed Vitals:   01/24/11 1041  BP: 163/89  Pulse: 81  Resp: 18  Height: 5\' 5"  (1.651 m)  Weight: 173 lb 6.4 oz (78.654 kg)  SpO2: 94%    GEN- The patient is well appearing, alert and oriented x 3 today.   Head- normocephalic, atraumatic Eyes-  Sclera clear, conjunctiva pink Ears- hearing intact Oropharynx- clear Neck- supple, no JVP Lymph- no cervical lymphadenopathy Lungs- Clear to ausculation bilaterally, normal work of breathing Chest- pacemaker pocket is well healed Heart- Regular rate and rhythm, no murmurs, rubs or gallops, PMI not laterally displaced GI- soft, NT, ND, + BS Extremities- no clubbing, cyanosis, or edema MS- no significant deformity or atrophy Skin- no rash or lesion Psych- euthymic mood, full affect Neuro- strength and sensation are intact  Pacemaker interrogation- reviewed in detail today,  See PACEART report  Assessment and Plan:

## 2011-02-26 ENCOUNTER — Encounter: Payer: Self-pay | Admitting: *Deleted

## 2011-02-26 ENCOUNTER — Ambulatory Visit (INDEPENDENT_AMBULATORY_CARE_PROVIDER_SITE_OTHER): Payer: Medicaid Other | Admitting: Cardiology

## 2011-02-26 VITALS — BP 166/90 | HR 69 | Ht 65.0 in | Wt 170.0 lb

## 2011-02-26 DIAGNOSIS — E785 Hyperlipidemia, unspecified: Secondary | ICD-10-CM

## 2011-02-26 DIAGNOSIS — Z950 Presence of cardiac pacemaker: Secondary | ICD-10-CM

## 2011-02-26 DIAGNOSIS — R072 Precordial pain: Secondary | ICD-10-CM

## 2011-02-26 DIAGNOSIS — I10 Essential (primary) hypertension: Secondary | ICD-10-CM

## 2011-02-26 MED ORDER — CHLORTHALIDONE 25 MG PO TABS: 25.0000 mg | ORAL_TABLET | Freq: Every day | ORAL | Status: DC

## 2011-02-26 MED ORDER — ATORVASTATIN CALCIUM 20 MG PO TABS: 20.0000 mg | ORAL_TABLET | Freq: Every day | ORAL | Status: DC

## 2011-02-26 NOTE — Assessment & Plan Note (Signed)
We will stop WelChol and add Lipitor 20 mg by mouth daily to his medical regimen. I asked him to followup with his primary care physician regarding lipid panels and liver function test.

## 2011-02-26 NOTE — Progress Notes (Signed)
HPI The patient is a 60 year old male with history of pacemaker implantation for complete heart block, multiple cardiac risk factors but no significant coronary artery disease by catheterization in 2001. Earlier this year the patient had a stress test done which showed normal perfusion and ejection fraction of 58%. The patient reports chronic back pain. He is getting steroid injections. He has dyslipidemia Is currently ony on WelChol and multiple statinl. His LDL has been markedly elevated. The patient was somewhat upset today because he struck with crank and was rushing to get over here. The patient denies any substernal chest pain shortness of breath orthopnea PND he has no palpitations or syncope.    Allergies  Allergen Reactions  . Codeine     REACTION: rash,itch  . Prednisone     REACTION: destroy bones per patient    Current Outpatient Prescriptions on File Prior to Visit  Medication Sig Dispense Refill  . albuterol (PROVENTIL) (2.5 MG/3ML) 0.083% nebulizer solution Take 2.5 mg by nebulization every 6 (six) hours as needed.        . Ascorbic Acid (VITAMIN C) 1000 MG tablet Take 1,000 mg by mouth daily.        . diazepam (VALIUM) 10 MG tablet Take 10 mg by mouth every 8 (eight) hours as needed.       . Flaxseed, Linseed, (FLAX SEED OIL) 1000 MG CAPS Take 1 capsule by mouth daily. 1300  mg      . ibuprofen (ADVIL,MOTRIN) 200 MG tablet Take 200 mg by mouth every 6 (six) hours as needed.        . metoprolol (TOPROL-XL) 100 MG 24 hr tablet Take 50 mg by mouth every morning.       . Multiple Vitamin (MULTIVITAMIN) tablet Take 1 tablet by mouth daily.        . Omega-3 Fatty Acids (FISH OIL) 1000 MG CAPS Take 3 capsules by mouth daily.        Marland Kitchen omeprazole (PRILOSEC) 20 MG capsule Take 20 mg by mouth daily.        Marland Kitchen PARoxetine (PAXIL) 20 MG tablet Take 10 mg by mouth daily.          Past Medical History  Diagnosis Date  . Complete heart block     with prior syncope s/p PPM 2004 in [place]    . HTN (hypertension), benign   . Pacemaker     medtronic kappa. 7/04 [place]   . Hx of cardiac catheterization     normal. 2001  . Anxiety   . HLD (hyperlipidemia)   . DDD (degenerative disc disease)     with multiple prior back surgeries     Past Surgical History  Procedure Date  . Cervical neck surgeries   . Pacemaker insertion     Implanted 2004 in [place]    Family History  Problem Relation Age of Onset  . Diabetes Neg Hx   . Hypertension Neg Hx   . Coronary artery disease Neg Hx     History   Social History  . Marital Status: Divorced    Spouse Name: N/A    Number of Children: N/A  . Years of Education: N/A   Occupational History  . Not on file.   Social History Main Topics  . Smoking status: Former Smoker -- 2.0 packs/day for 3 years    Types: Cigarettes    Quit date: 07/01/1977  . Smokeless tobacco: Never Used  . Alcohol Use: No  . Drug Use: Yes  smokes occasional marijuana, no ready to quit  . Sexually Active: Not on file   Other Topics Concern  . Not on file   Social History Narrative   Lives alone   EXB:[id number] positives as outlined above. The remainder of the 18  point review of systems is negative  PHYSICAL EXAM BP 166/90  Pulse 69  Ht 5\' 5"  (1.651 m)  Wt 170 lb (77.111 kg)  BMI 28.29 kg/m2  General: Well-developed, well-nourished in no distress Head: Normocephalic and atraumatic Eyes:PERRLA/EOMI intact, conjunctiva and lids normal Ears: No deformity or lesions Mouth:normal dentition, normal posterior pharynx Neck: Supple, no JVD.  No masses, thyromegaly or abnormal cervical nodes Lungs: Normal breath sounds bilaterally without wheezing.  Normal percussion Cardiac: regular rate and rhythm with normal S1 and S2, no S3 or S4.  PMI is normal.  No pathological murmurs Abdomen: Normal bowel sounds, abdomen is soft and nontender without masses, organomegaly or hernias noted.  No hepatosplenomegaly MSK: Back normal, normal gait muscle  strength and tone normal Vascular: Pulse is normal in all 4 extremities Extremities: No peripheral pitting edema Neurologic: Alert and oriented x 3 Skin: Intact without lesions or rashes Lymphatics: No significant adenopathy Psychologic: Normal affect not available   ECG:  ASSESSMENT AND PLAN

## 2011-02-26 NOTE — Assessment & Plan Note (Signed)
Followed by Dr. Johney Frame in the pacemaker clinic

## 2011-02-26 NOTE — Assessment & Plan Note (Signed)
Blood pressure is poorly controlled and we will add chlorthalidone 25 mg by mouth daily to his medical regimen

## 2011-02-26 NOTE — Assessment & Plan Note (Signed)
The patient reports no recurrent chest pain. No further ischemia workup needed

## 2011-02-26 NOTE — Patient Instructions (Addendum)
Your physician you to follow up in 1 year. You will receive a reminder letter in the mail one-two months in advance. If you don't receive a letter, please call our office to schedule the follow-up appointment. Start Chlorthalidone 25 mg daily. Stop Welchol. Start Lipitor (atorvastatin) 20 mg every night. Have Dr. Loney Hering check your cholesterol and liver function lab work at next office visit. Have him fax a copy of this lab work to our office.

## 2011-03-20 ENCOUNTER — Telehealth: Payer: Self-pay | Admitting: *Deleted

## 2011-03-20 NOTE — Telephone Encounter (Signed)
Spoke with patient r/e statin change. Medicaid denied request for lipitor. Nurse called and explained it to patient that lipitor changed to pravastatin 40mg  per MD. Patient very upset that insurance wouldn't cover atorvastatin and didn't understand this saying it was "that damn Obama law that caused this". Nurse explained to patient that he would have to discuss this with his insurance. Patient told nurse that he already got his atorvastatin filled and didn't know why he couldn't get it again. Nurse called [facility] to inquire status of lipitor refill. Diane informed nurse that patient had already picked up atorvastatin and it didn't need authorization any longer. Nurse informed patient that medication was taken care of and he could continue his current regimen. No changes made to his statin drug. MD informed.

## 2011-04-26 ENCOUNTER — Encounter: Payer: Self-pay | Admitting: Internal Medicine

## 2011-04-26 ENCOUNTER — Ambulatory Visit (INDEPENDENT_AMBULATORY_CARE_PROVIDER_SITE_OTHER): Payer: Medicaid Other | Admitting: *Deleted

## 2011-04-26 DIAGNOSIS — I442 Atrioventricular block, complete: Secondary | ICD-10-CM

## 2011-04-26 DIAGNOSIS — Z950 Presence of cardiac pacemaker: Secondary | ICD-10-CM

## 2011-04-26 LAB — PACEMAKER DEVICE OBSERVATION
AL AMPLITUDE: 2.8 mv
BATTERY VOLTAGE: 2.64 V
BRDY-0002RV: 60 {beats}/min
BRDY-0003RV: 150 {beats}/min
RV LEAD THRESHOLD: 1.125 V
VENTRICULAR PACING PM: 100

## 2011-04-26 NOTE — Progress Notes (Signed)
Pacer check in clinic  

## 2011-05-02 DIAGNOSIS — T8859XA Other complications of anesthesia, initial encounter: Secondary | ICD-10-CM

## 2011-05-02 HISTORY — DX: Other complications of anesthesia, initial encounter: T88.59XA

## 2011-06-05 ENCOUNTER — Other Ambulatory Visit (HOSPITAL_COMMUNITY): Payer: Self-pay | Admitting: Neurosurgery

## 2011-06-05 ENCOUNTER — Other Ambulatory Visit: Payer: Self-pay | Admitting: Neurosurgery

## 2011-06-05 DIAGNOSIS — M545 Low back pain: Secondary | ICD-10-CM

## 2011-06-20 ENCOUNTER — Encounter: Payer: Self-pay | Admitting: Internal Medicine

## 2011-06-20 ENCOUNTER — Ambulatory Visit (INDEPENDENT_AMBULATORY_CARE_PROVIDER_SITE_OTHER): Payer: Medicaid Other | Admitting: *Deleted

## 2011-06-20 DIAGNOSIS — I442 Atrioventricular block, complete: Secondary | ICD-10-CM

## 2011-06-20 LAB — PACEMAKER DEVICE OBSERVATION
AL IMPEDENCE PM: 510 Ohm
ATRIAL PACING PM: 18
BATTERY VOLTAGE: 2.62 V
RV LEAD IMPEDENCE PM: 531 Ohm

## 2011-06-20 NOTE — Progress Notes (Signed)
Battery check only 

## 2011-07-04 ENCOUNTER — Other Ambulatory Visit: Payer: Self-pay | Admitting: Neurosurgery

## 2011-07-05 ENCOUNTER — Ambulatory Visit (HOSPITAL_COMMUNITY)
Admission: RE | Admit: 2011-07-05 | Discharge: 2011-07-05 | Disposition: A | Discharge status: Home / Self Care | Payer: Medicaid Other | Source: Ambulatory Visit | Attending: Neurosurgery | Admitting: Neurosurgery

## 2011-07-05 DIAGNOSIS — M545 Low back pain: Secondary | ICD-10-CM

## 2011-07-05 DIAGNOSIS — M5126 Other intervertebral disc displacement, lumbar region: Secondary | ICD-10-CM | POA: Insufficient documentation

## 2011-07-05 MED ORDER — IOHEXOL 180 MG/ML  SOLN
20.0000 mL | Freq: Once | INTRAMUSCULAR | Status: AC | PRN
  Administered 2011-07-05: 20 mL via INTRAVENOUS

## 2011-07-05 MED ORDER — ONDANSETRON HCL 4 MG/2ML IJ SOLN: 4.0000 mg | Freq: Four times a day (QID) | INTRAMUSCULAR | Status: DC | PRN

## 2011-07-05 MED ORDER — DIAZEPAM 5 MG PO TABS: 10.0000 mg | ORAL_TABLET | Freq: Once | ORAL | Status: DC

## 2011-07-05 NOTE — Progress Notes (Signed)
Notified Dr. Venetia Maxon and informed that pt refused IV start. No new orders received.

## 2011-07-05 NOTE — Progress Notes (Addendum)
Dr Venetia Maxon phoned by this RN. Discharge order obtained for 1400, but per Dr. Venetia Maxon stated pt may stay until 1600 if needed depending on condition. Dr. Venetia Maxon informed that pt has requested that he drive himself home due to having not received any sedation during his procedure or any premed. Per Dr. Venetia Maxon, pt may drive himself home.

## 2011-07-05 NOTE — Procedures (Signed)
L 23 puncture; Omnipaque 180 

## 2011-07-05 NOTE — Progress Notes (Signed)
Pt informed that MD has ordered an IV to be started. Pt refused to allow IV to be started.

## 2011-07-19 ENCOUNTER — Encounter: Payer: Self-pay | Admitting: Internal Medicine

## 2011-07-19 ENCOUNTER — Ambulatory Visit (INDEPENDENT_AMBULATORY_CARE_PROVIDER_SITE_OTHER): Payer: Medicaid Other | Admitting: *Deleted

## 2011-07-19 DIAGNOSIS — Z950 Presence of cardiac pacemaker: Secondary | ICD-10-CM

## 2011-07-19 DIAGNOSIS — I442 Atrioventricular block, complete: Secondary | ICD-10-CM

## 2011-07-19 LAB — PACEMAKER DEVICE OBSERVATION
AL AMPLITUDE: 2.8 mv
BRDY-0002RV: 60 {beats}/min
BRDY-0003RV: 150 {beats}/min
RV LEAD THRESHOLD: 1.125 V

## 2011-07-19 NOTE — Progress Notes (Signed)
Pacer interrogation only for battery check  

## 2011-07-23 ENCOUNTER — Other Ambulatory Visit: Payer: Self-pay | Admitting: Neurosurgery

## 2011-07-23 ENCOUNTER — Telehealth: Payer: Self-pay | Admitting: *Deleted

## 2011-07-23 NOTE — Telephone Encounter (Signed)
Pt left a message on voicemail stating Dr. Venetia Maxon has scheduled a lumbar fusion surgery. He states at his last device check he was told his PTVP only has 1-4 months left on the battery. He states Dr. Venetia Maxon would like to know if he can proceed w/surgery now or if he needs change out before surgery done.

## 2011-07-23 NOTE — Telephone Encounter (Signed)
Spoke with pt who confirms message left. He states surgery is scheduled for 2/5, if this is okay with cardiologist. Pt notified note will be sent to Dr. Johney Frame for further review.

## 2011-07-23 NOTE — Telephone Encounter (Signed)
Spoke with pt in regards to pacemaker battery. Longevity 1-9 mths per check on 07-19-11. Pt aware does not need gen change until reaches ERI. Pt voices understanding about pacemaker battery.

## 2011-07-26 ENCOUNTER — Encounter (HOSPITAL_COMMUNITY): Payer: Self-pay | Admitting: Respiratory Therapy

## 2011-07-30 ENCOUNTER — Telehealth: Payer: Self-pay | Admitting: *Deleted

## 2011-07-30 NOTE — Telephone Encounter (Signed)
Left message on voice mail scheduled for lumbar fusion on 2/5.  Having fluctuation in blood pressure.  98/70, 100/65, 89/65.  Decided to cut pill in half on his own.    Returned call to patient.  Scheduled for back surgery with Dr. Venetia Maxon on 2/5.  States he cut his Chlorthalidone in half on his own.  States he does have dizziness with low BP.  Stated that Baxter Hire said his device was okay for surgery.  Informed him that did not give him clearance from the MD.  His device may be okay, but from cardiac standpoint would still need to be evaluated (including recent issues with blood pressure).  Scheduled OV with Dr. Kirke Corin for Friday, 2/8.

## 2011-07-31 ENCOUNTER — Inpatient Hospital Stay (HOSPITAL_COMMUNITY): Admission: RE | Admit: 2011-07-31 | Discharge: 2011-07-31 | Payer: Medicaid Other | Source: Ambulatory Visit

## 2011-07-31 NOTE — Pre-Procedure Instructions (Signed)
20 Jason Yu  07/31/2011   Your procedure is scheduled on: 2.5.13  Report to [facility] at 530* AM.  Call this number if you have problems the morning of surgery: [phone]   Remember:   Do not eat food:After Midnight.  May have clear liquids: up to 4 Hours before arrival.  Clear liquids include soda, tea, black coffee, apple or grape juice, broth.  Take these medicines the morning of surgery with A SIP OF WATER: albuterol inhaler ,amlodipine,valium,hydrocodone,metoprolol,omeprazole,paxil               STOP flaxseed,glucosomine,omega 3, multi vit TODAY   Do not wear jewelry, make-up or nail polish.  Do not wear lotions, powders, or perfumes. You may wear deodorant.  Do not shave 48 hours prior to surgery.  Do not bring valuables to the hospital.  Contacts, dentures or bridgework may not be worn into surgery.  Leave suitcase in the car. After surgery it may be brought to your room.  For patients admitted to the hospital, checkout time is 11:00 AM the day of discharge.   Patients discharged the day of surgery will not be allowed to drive home.  Name and phone number of your driver:   Special Instructions: CHG Shower Use Special Wash: 1/2 bottle night before surgery and 1/2 bottle morning of surgery.   Please read over the following fact sheets that you were given: Pain Booklet, Coughing and Deep Breathing, Blood Transfusion Information, MRSA Information and Surgical Site Infection Prevention

## 2011-08-02 HISTORY — PX: INSERT / REPLACE / REMOVE PACEMAKER: SUR710

## 2011-08-06 ENCOUNTER — Ambulatory Visit (HOSPITAL_COMMUNITY): Admission: RE | Admit: 2011-08-06 | Payer: Medicaid Other | Source: Ambulatory Visit | Admitting: Neurosurgery

## 2011-08-06 ENCOUNTER — Encounter (HOSPITAL_COMMUNITY): Admission: RE | Payer: Self-pay | Source: Ambulatory Visit

## 2011-08-06 SURGERY — POSTERIOR LUMBAR FUSION 2 LEVEL
Anesthesia: General | Site: Back

## 2011-08-09 ENCOUNTER — Encounter: Payer: Self-pay | Admitting: *Deleted

## 2011-08-09 ENCOUNTER — Ambulatory Visit (INDEPENDENT_AMBULATORY_CARE_PROVIDER_SITE_OTHER): Payer: Medicaid Other | Admitting: Cardiovascular Disease

## 2011-08-09 ENCOUNTER — Encounter: Payer: Self-pay | Admitting: Internal Medicine

## 2011-08-09 ENCOUNTER — Encounter: Payer: Self-pay | Admitting: Cardiovascular Disease

## 2011-08-09 ENCOUNTER — Ambulatory Visit (INDEPENDENT_AMBULATORY_CARE_PROVIDER_SITE_OTHER): Payer: Medicaid Other | Admitting: *Deleted

## 2011-08-09 ENCOUNTER — Telehealth: Payer: Self-pay | Admitting: *Deleted

## 2011-08-09 VITALS — BP 114/80 | HR 65 | Ht 65.0 in | Wt 165.0 lb

## 2011-08-09 DIAGNOSIS — R0602 Shortness of breath: Secondary | ICD-10-CM

## 2011-08-09 DIAGNOSIS — Z950 Presence of cardiac pacemaker: Secondary | ICD-10-CM

## 2011-08-09 DIAGNOSIS — I442 Atrioventricular block, complete: Secondary | ICD-10-CM

## 2011-08-09 DIAGNOSIS — I10 Essential (primary) hypertension: Secondary | ICD-10-CM

## 2011-08-09 DIAGNOSIS — Z01810 Encounter for preprocedural cardiovascular examination: Secondary | ICD-10-CM

## 2011-08-09 LAB — PROTIME-INR

## 2011-08-09 LAB — PACEMAKER DEVICE OBSERVATION

## 2011-08-09 NOTE — Patient Instructions (Signed)
Your physician wants you to follow-up in: 6 months. You will receive a reminder letter in the mail one-two months in advance. If you don't receive a letter, please call our office to schedule the follow-up appointment. Stop Chlorthalidone.

## 2011-08-09 NOTE — Progress Notes (Signed)
Pacemaker check in clinic.  Device at elective replacement indicator and has reverted to VVI 65 which has resulted in fatigue and hypotension.  Discussed with Dr Johney Frame, plan gen change for 08-16-2011.  Dr Johney Frame will discuss with patient in hospital.    Patient also seen by Dr Kirke Corin today.   Gypsy Balsam, RN, BSN 08/09/2011 4:31 PM

## 2011-08-09 NOTE — Telephone Encounter (Signed)
Change out 2-15- Dr. [facility] -short stay

## 2011-08-12 ENCOUNTER — Encounter (HOSPITAL_COMMUNITY): Payer: Self-pay | Admitting: Pharmacy Technician

## 2011-08-12 ENCOUNTER — Encounter: Payer: Self-pay | Admitting: Cardiovascular Disease

## 2011-08-12 DIAGNOSIS — Z01810 Encounter for preprocedural cardiovascular examination: Secondary | ICD-10-CM

## 2011-08-12 HISTORY — DX: Encounter for preprocedural cardiovascular examination: Z01.810

## 2011-08-12 NOTE — Assessment & Plan Note (Signed)
The patient has no known history of ischemic heart disease. He had a nuclear stress test done last year which was normal. Thus, no further ischemic cardiac evaluation is warranted before his planned back surgery. However, his pacemaker battery life is at end-of-life and currently being paced at a VVI mode which is likely contributing to his symptoms of dyspnea, fatigue and hypotension. I recommend replacing his pacemaker before his back surgery. This was arranged by Dr. Johney Frame who is planning on doing the procedure next week. Once he is recovered from the pacemaker implantation, the patient can undergo back surgery.

## 2011-08-12 NOTE — Assessment & Plan Note (Signed)
His blood pressure has been running low likely due to AV asynchrony. I recommend holding chlorthalidone altogether until after his pacemaker implantation. I suspect, that he would need this medication again to nitroglycerin his blood pressure in the future.

## 2011-08-12 NOTE — Assessment & Plan Note (Signed)
The patient is scheduled for generator replacement next week.

## 2011-08-12 NOTE — Progress Notes (Signed)
HPI  This is a 61 year old male who is here today for a followup visit and they're preoperative cardiovascular evaluation for back surgery. The patient has known history of complete heart block status post dual-chamber pacemaker placement. He had cardiac catheterization in 2001 which showed no significant coronary artery disease. He had a nuclear stress test done in 2012 which was normal with ejection fraction of 58%. His pacemaker is known to be approaching ERI. He recently started complaining of increased fatigue and dyspnea as well as episodes of hypotension. He actually had to cut the dose of his medications by half. Device interrogation showed that the pacemaker battery is at end-of-life and thus it switch her dramatically to a VVI mode. 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 He denies any chest pain or syncope.  Allergies  Allergen Reactions  . Prednisone Other (See Comments)    REACTION: pain in joints  . Codeine Rash    REACTION: rash,itch     Current Outpatient Prescriptions on File Prior to Visit  Medication Sig Dispense Refill  . albuterol (PROVENTIL) (2.5 MG/3ML) 0.083% nebulizer solution Take 2.5 mg by nebulization every 6 (six) hours as needed. For shortness of breath      . amLODipine (NORVASC) 10 MG tablet Take 5 mg by mouth daily.       . Ascorbic Acid (VITAMIN C) 1000 MG tablet Take 1,000 mg by mouth daily.        Marland Kitchen atorvastatin (LIPITOR) 20 MG tablet Take 20 mg by mouth at bedtime.      . diazepam (VALIUM) 10 MG tablet Take 10 mg by mouth every 8 (eight) hours as needed.       . Flaxseed, Linseed, (FLAX SEED OIL) 1000 MG CAPS Take 1 capsule by mouth daily. 1300 mg      . Glucosamine HCl 1000 MG TABS Take 2 tablets by mouth daily.        Marland Kitchen HYDROcodone-acetaminophen (NORCO) 10-325 MG per tablet Take 1 tablet by mouth every 8 (eight) hours as needed.        Marland Kitchen ketoconazole (NIZORAL) 2 % cream Apply 1 application topically daily. For rash      . metoprolol (TOPROL-XL) 100 MG 24 hr  tablet Take 50 mg by mouth every morning.       . Multiple Vitamin (MULTIVITAMIN) tablet Take 1 tablet by mouth daily.        . Omega-3 Fatty Acids (FISH OIL) 1000 MG CAPS Take 3 capsules by mouth daily.        Marland Kitchen omeprazole (PRILOSEC) 20 MG capsule Take 20 mg by mouth daily.        . potassium chloride (K-DUR,KLOR-CON) 10 MEQ tablet Take 10 mEq by mouth 2 (two) times daily.         Past Medical History  Diagnosis Date  . Complete heart block     with prior syncope s/p PPM 2004 in [place]  . HTN (hypertension), benign   . Pacemaker     medtronic kappa. 7/04 [place]   . Hx of cardiac catheterization     normal. 2001  . Anxiety   . HLD (hyperlipidemia)   . DDD (degenerative disc disease)     with multiple prior back surgeries      Past Surgical History  Procedure Date  . Cervical neck surgeries   . Pacemaker insertion     Implanted 2004 in [place]  . Cardiac catheterization 2001    no significant CAD     Family History  Problem Relation Age of Onset  . Diabetes Neg Hx   . Hypertension Neg Hx   . Coronary artery disease Neg Hx      History   Social History  . Marital Status: Widowed    Spouse Name: N/A    Number of Children: N/A  . Years of Education: N/A   Occupational History  . Not on file.   Social History Main Topics  . Smoking status: Former Smoker -- 2.0 packs/day for 3 years    Types: Cigarettes    Quit date: 11/29/1977  . Smokeless tobacco: Never Used  . Alcohol Use: No  . Drug Use: Yes     smokes occasional marijuana, no ready to quit  . Sexually Active: Not on file   Other Topics Concern  . Not on file   Social History Narrative   Lives alone     PHYSICAL EXAM   BP 114/80  Pulse 65  Ht 5\' 5"  (1.651 m)  Wt 165 lb (74.844 kg)  BMI 27.46 kg/m2  SpO2 97% Constitutional: He is oriented to person, place, and time. He appears well-developed and well-nourished. No distress.  HENT: No nasal discharge.  Head: Normocephalic and atraumatic.   Eyes: Pupils are equal and round. Right eye exhibits no discharge. Left eye exhibits no discharge.  Neck: Normal range of motion. Neck supple. No JVD present. No thyromegaly present.  Cardiovascular: Normal rate, regular rhythm, normal heart sounds and. Exam reveals no gallop and no friction rub. No murmur heard.  Pulmonary/Chest: Effort normal and breath sounds normal. No stridor. No respiratory distress. He has no wheezes. He has no rales. He exhibits no tenderness.  Abdominal: Soft. Bowel sounds are normal. He exhibits no distension. There is no tenderness. There is no rebound and no guarding.  Musculoskeletal: Normal range of motion. He exhibits no edema and no tenderness.  Neurological: He is alert and oriented to person, place, and time. Coordination normal.  Skin: Skin is warm and dry. No rash noted. He is not diaphoretic. No erythema. No pallor.  Psychiatric: He has a normal mood and affect. His behavior is normal. Judgment and thought content normal.       EKG: Ventricular paced rhythm with AV asynchrony. VVI pacemaker mode.   ASSESSMENT AND PLAN

## 2011-08-12 NOTE — Telephone Encounter (Signed)
No precert required 

## 2011-08-14 ENCOUNTER — Encounter: Payer: Medicaid Other | Admitting: *Deleted

## 2011-08-15 MED ORDER — CEFAZOLIN SODIUM-DEXTROSE 2-3 GM-% IV SOLR
2.0000 g | INTRAVENOUS | Status: DC
  Filled 2011-08-15: qty 50

## 2011-08-16 ENCOUNTER — Ambulatory Visit (HOSPITAL_COMMUNITY)
Admission: RE | Admit: 2011-08-16 | Discharge: 2011-08-16 | Disposition: A | Discharge status: Home / Self Care | Payer: Medicaid Other | Source: Ambulatory Visit | Attending: Internal Medicine | Admitting: Internal Medicine

## 2011-08-16 ENCOUNTER — Other Ambulatory Visit: Payer: Self-pay | Admitting: *Deleted

## 2011-08-16 ENCOUNTER — Encounter (HOSPITAL_COMMUNITY)
Admission: RE | Disposition: A | Discharge status: Home / Self Care | Payer: Self-pay | Source: Ambulatory Visit | Attending: Internal Medicine

## 2011-08-16 ENCOUNTER — Ambulatory Visit: Payer: Medicaid Other | Admitting: Cardiovascular Disease

## 2011-08-16 DIAGNOSIS — F411 Generalized anxiety disorder: Secondary | ICD-10-CM | POA: Insufficient documentation

## 2011-08-16 DIAGNOSIS — I442 Atrioventricular block, complete: Secondary | ICD-10-CM

## 2011-08-16 DIAGNOSIS — Z45018 Encounter for adjustment and management of other part of cardiac pacemaker: Secondary | ICD-10-CM | POA: Insufficient documentation

## 2011-08-16 DIAGNOSIS — IMO0002 Reserved for concepts with insufficient information to code with codable children: Secondary | ICD-10-CM | POA: Insufficient documentation

## 2011-08-16 HISTORY — PX: OTHER SURGICAL HISTORY: SHX169

## 2011-08-16 HISTORY — PX: PACEMAKER GENERATOR CHANGE: SHX5481

## 2011-08-16 LAB — SURGICAL PCR SCREEN
MRSA, PCR: NEGATIVE
Staphylococcus aureus: POSITIVE — AB

## 2011-08-16 SURGERY — PACEMAKER GENERATOR CHANGE
Anesthesia: LOCAL | Laterality: Left

## 2011-08-16 MED ORDER — SODIUM CHLORIDE 0.9 % IV SOLN: INTRAVENOUS | Status: DC

## 2011-08-16 MED ORDER — FENTANYL CITRATE 0.05 MG/ML IJ SOLN
INTRAMUSCULAR | Status: AC
  Filled 2011-08-16: qty 2

## 2011-08-16 MED ORDER — HYDROCODONE-ACETAMINOPHEN 5-325 MG PO TABS: 1.0000 | ORAL_TABLET | ORAL | Status: DC | PRN

## 2011-08-16 MED ORDER — MIDAZOLAM HCL 2 MG/2ML IJ SOLN
INTRAMUSCULAR | Status: AC
  Filled 2011-08-16: qty 2

## 2011-08-16 MED ORDER — CEFAZOLIN SODIUM 1-5 GM-% IV SOLN
INTRAVENOUS | Status: DC
Start: 2011-08-16 — End: 2011-08-16
  Filled 2011-08-16: qty 50

## 2011-08-16 MED ORDER — CEFAZOLIN SODIUM 1-5 GM-% IV SOLN
1.0000 g | Freq: Once | INTRAVENOUS | Status: AC
  Administered 2011-08-16: 1 g via INTRAVENOUS
  Filled 2011-08-16: qty 50

## 2011-08-16 MED ORDER — HEPARIN (PORCINE) IN NACL 2-0.9 UNIT/ML-% IJ SOLN
INTRAMUSCULAR | Status: AC
  Filled 2011-08-16: qty 1000

## 2011-08-16 MED ORDER — ACETAMINOPHEN 325 MG PO TABS
325.0000 mg | ORAL_TABLET | ORAL | Status: DC | PRN
  Filled 2011-08-16: qty 2

## 2011-08-16 MED ORDER — ONDANSETRON HCL 4 MG/2ML IJ SOLN: 4.0000 mg | Freq: Four times a day (QID) | INTRAMUSCULAR | Status: DC | PRN

## 2011-08-16 MED ORDER — CHLORHEXIDINE GLUCONATE 4 % EX LIQD
60.0000 mL | CUTANEOUS | Status: DC
  Filled 2011-08-16: qty 60

## 2011-08-16 MED ORDER — CEFAZOLIN SODIUM 1-5 GM-% IV SOLN: 1.0000 g | INTRAVENOUS | Status: DC

## 2011-08-16 MED ORDER — SODIUM CHLORIDE 0.9 % IR SOLN
80.0000 mg | Status: DC
  Filled 2011-08-16: qty 2

## 2011-08-16 MED ORDER — MUPIROCIN 2 % EX OINT
TOPICAL_OINTMENT | CUTANEOUS | Status: AC
  Filled 2011-08-16: qty 22

## 2011-08-16 MED ORDER — LIDOCAINE HCL (PF) 1 % IJ SOLN
INTRAMUSCULAR | Status: AC
  Filled 2011-08-16: qty 60

## 2011-08-16 NOTE — Op Note (Signed)
SURGEON:  Hillis Range, MD     PREPROCEDURE DIAGNOSES:   1. Complete heart block    POSTPROCEDURE DIAGNOSES:   1. Complete heart block    PROCEDURES:   1. Pacemaker pulse generator replacement.   2. Skin pocket revision.     INTRODUCTION:  Jason Yu is a 61 y.o. male with a history of complete heart block. He has done well since his pacemaker was implanted.  He has recently reached ERI battery status.  He presents today for pacemaker pulse generator replacement.       DESCRIPTION OF THE PROCEDURE:  Informed written consent was obtained, and the patient was brought to the electrophysiology lab in the fasting state.  The patient's pacemaker was interrogated today and found to be at elective replacement indicator battery status.  The patient was adequately sedated with intravenous Versed as outlined in the nursing report.  The patient's left chest was prepped and draped in the usual sterile fashion by the EP lab staff.  The skin overlying the existing pacemaker was infiltrated with lidocaine for local analgesia.  A 4-cm incision was made over the pacemaker pocket.  Using a combination of sharp and blunt dissection, the pacemaker was exposed and removed from the body.  The device was disconnected from the leads.There was no foreign matter or debris within the pocket.  The atrial lead was confirmed to be a Medtronic model [id number] (serial number [id number]) lead implanted on 01/07/2003.  The right ventricular lead was confirmed to be a Medtronic model [id number] (serial number [id number]) lead implanted on the same date as the atrial lead (above).  Both leads were examined and their integrity was confirmed to be intact.  Atrial lead P-waves measured 3.4 mV with impedance of 543 ohms and a threshold of 0.6 V at 0.5 msec.  Right ventricular lead R-waves could not be measured due to complete heart block with no ventricular escape.  The RV lead impedance was 672 ohms and a threshold of 1.3 V at 0.5 msec.  Both  leads were connected to a Medtronic adapt L model ADDDR 1 (serial number [id number]) pacemaker.  The pocket was revised to accommodate this new device.  Electrocautery was required to assure hemostasis.  The pocket was irrigated with copious gentamicin solution. The pacemaker was then placed into the pocket.  The pocket was then closed in 2 layers with 2-0 Vicryl suture over the subcutaneous and subcuticular layers.  Steri-Strips and a sterile dressing were then applied.  There were no early apparent complications.     CONCLUSIONS:   1. Successful pacemaker pulse generator replacement for elective replacement indicator battery status   2. No early apparent complications.     Hillis Range, MD 08/16/2011 2:53 PM

## 2011-08-16 NOTE — Discharge Instructions (Signed)
Pacemaker Battery Change A pacemaker battery usually lasts 4 to 12 years. Once or twice per year, you will be asked to visit your caregiver to have a full evaluation of your pacemaker. When a battery needs to be replaced, the entire pacemaker is actually replaced so that you can benefit from new circuitry and any new features that have recently been added to pacemakers. Most often, this procedure is very simple because the leads are already in place. After giving medicine to numb the skin, your health care provider makes a cut to reopen the pocket holding the pacemaker and disconnects the old device from its leads. The leads are routinely tested at this time. If they are working okay, the new pacemaker may simply be connected to the existing leads. If there is any problem with the old lead system, it may be wise to replace the lead system while inserting the new pacemaker. There are many things that affect how long a pacemaker battery will last:  Age of the pacemaker.   Number of leads (1, 2 or 3).   Pacemaker work load. If the pacemaker is helping the heart more often, then the battery will not last as long as if the pacemaker does not need to help the heart.   Resistance of the leads. The greater the resistance, the greater the drain on the battery. This can increase as the leads get older or if one or more of the leads does not have the best contact with the heart.   Power (voltage) settings.   The health of the person's heart. If the health of the heart gets worse, then the pacemaker may have to work more often and the setting changed to accommodate these changes.  Your health care provider will be alerted to the fact that it is time to replace the battery during follow-up exams. He or she will check your pacemaker using a small table-top computer, called a programmer, and a wand. The wand is about the same size as a remote control. Your provider puts the wand on your body in the area where the  pacemaker is located. Information from the pacemaker is received about how well your heart is working and the status of the battery. It is not painful, and it usually takes just a few minutes. You will have plenty of time before the battery is fully used up to plan for replacement.  LET YOUR CAREGIVER KNOW ABOUT:   Symptoms of chest pain, trouble breathing, palpitations, lightheadedness, or feelings of an abnormal or irregular heart beat.   Allergies.   Medications taken including herbs, eye drops, over the counter medications, and creams   Use of steroids (by mouth or creams).   Possible pregnancy, if applicable.   Previous problems with anesthetics or Novocaine.   History of blood clots (thrombophlebitis).   History of bleeding or blood problems.   Surgery since your last pacemaker placement.   Other health problems.  RISKS AND COMPLICATIONS These are very uncommon but include:  Bleeding.   Bruising of the skin around where the incision was made.   Pain at the site of the incision.   Pulling apart of the skin at the incision site.   Infection.   Allergic reaction to anesthetics or medicines used during the procedure.  Diabetics may have a temporary increase in their blood sugar after any surgical procedure.  BEFORE THE PROCEDURE  Wash all of the skin around the area of the chest where the pacemaker is located.   Try to remove any loose, scaling skin. Unless advised otherwise, avoid using aspirin, ibuprofen, or naproxen for 3-4 days before the procedure. Ask your caregiver for help with any other medication adjustments before the pacemaker is replaced. Unless advised otherwise, do not eat or drink after midnight on the night before the procedure EXCEPT for drinking water and taking your medications as you normally would. AFTER THE PROCEDURE   A heart monitor and the pacemaker programmer will be used to make sure that the new pacemaker is working properly.   You can go home  after the procedure.   Your caregiver will advise you if you need to have any stitches. They will be removed 5-7 days after the procedure.  HOME CARE INSTRUCTIONS   Keep the incision clean and dry.   Unless advised otherwise, you may shower after carefully covering the incision with plastic wrap that is taped to your chest.   For the first week after the replacement, avoid stretching motions that pull at the incision site and avoid heavy exercise with the arm on the same side as the incision.   Only take over-the-counter or prescription medicines for pain, discomfort, or fever as directed by your caregiver.   Your caregiver will tell you when you will need to next test your pacemaker by telephone or when to return to the office for re-exam and/or removal of stitches, if necessary.  SEEK MEDICAL CARE IF:   You have unusual pain at the incision site that is not adequately helped by over-the-counter or prescription medicine.   There is drainage or pus from the incision site.   You develop red streaking that extends above or below the incision site.   You feel brief intermittent palpitations, lightheadedness or any symptoms that you feel might be related to your heart.  SEEK IMMEDIATE MEDICAL CARE IF:   You experience chest pain that is different than the pain at the incision site.   You experience:   Shortness of breath.   Palpitations.   Irregular heart beat.   Lightheadedness that does not go away quickly.   Fainting.   You develop a fever.   You have pain that gets worse even though you are taking pain medicine.  MAKE SURE YOU:   Understand these instructions.   Will watch your condition.   Will get help right away if you are not doing well or get worse.  Document Released: 09/25/2006 Document Revised: 02/27/2011 Document Reviewed: 12/29/2006 [employer] Patient Information 2012 [employer]. 

## 2011-08-16 NOTE — H&P (View-Only) (Signed)
HPI  This is a 61-year-old male who is here today for a followup visit and they're preoperative cardiovascular evaluation for back surgery. The patient has known history of complete heart block status post dual-chamber pacemaker placement. He had cardiac catheterization in 2001 which showed no significant coronary artery disease. He had a nuclear stress test done in 2012 which was normal with ejection fraction of 58%. His pacemaker is known to be approaching ERI. He recently started complaining of increased fatigue and dyspnea as well as episodes of hypotension. He actually had to cut the dose of his medications by half. Device interrogation showed that the pacemaker battery is at end-of-life and thus it switch her dramatically to a VVI mode. 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 He denies any chest pain or syncope.  Allergies  Allergen Reactions  . Prednisone Other (See Comments)    REACTION: pain in joints  . Codeine Rash    REACTION: rash,itch     Current Outpatient Prescriptions on File Prior to Visit  Medication Sig Dispense Refill  . albuterol (PROVENTIL) (2.5 MG/3ML) 0.083% nebulizer solution Take 2.5 mg by nebulization every 6 (six) hours as needed. For shortness of breath      . amLODipine (NORVASC) 10 MG tablet Take 5 mg by mouth daily.       . Ascorbic Acid (VITAMIN C) 1000 MG tablet Take 1,000 mg by mouth daily.        . atorvastatin (LIPITOR) 20 MG tablet Take 20 mg by mouth at bedtime.      . diazepam (VALIUM) 10 MG tablet Take 10 mg by mouth every 8 (eight) hours as needed.       . Flaxseed, Linseed, (FLAX SEED OIL) 1000 MG CAPS Take 1 capsule by mouth daily. 1300 mg      . Glucosamine HCl 1000 MG TABS Take 2 tablets by mouth daily.        . HYDROcodone-acetaminophen (NORCO) 10-325 MG per tablet Take 1 tablet by mouth every 8 (eight) hours as needed.        . ketoconazole (NIZORAL) 2 % cream Apply 1 application topically daily. For rash      . metoprolol (TOPROL-XL) 100 MG 24 hr  tablet Take 50 mg by mouth every morning.       . Multiple Vitamin (MULTIVITAMIN) tablet Take 1 tablet by mouth daily.        . Omega-3 Fatty Acids (FISH OIL) 1000 MG CAPS Take 3 capsules by mouth daily.        . omeprazole (PRILOSEC) 20 MG capsule Take 20 mg by mouth daily.        . potassium chloride (K-DUR,KLOR-CON) 10 MEQ tablet Take 10 mEq by mouth 2 (two) times daily.         Past Medical History  Diagnosis Date  . Complete heart block     with prior syncope s/p PPM 2004 in [place]  . HTN (hypertension), benign   . Pacemaker     medtronic kappa. 7/04 [place]   . Hx of cardiac catheterization     normal. 2001  . Anxiety   . HLD (hyperlipidemia)   . DDD (degenerative disc disease)     with multiple prior back surgeries      Past Surgical History  Procedure Date  . Cervical neck surgeries   . Pacemaker insertion     Implanted 2004 in [place]  . Cardiac catheterization 2001    no significant CAD     Family History    Problem Relation Age of Onset  . Diabetes Neg Hx   . Hypertension Neg Hx   . Coronary artery disease Neg Hx      History   Social History  . Marital Status: Widowed    Spouse Name: N/A    Number of Children: N/A  . Years of Education: N/A   Occupational History  . Not on file.   Social History Main Topics  . Smoking status: Former Smoker -- 2.0 packs/day for 3 years    Types: Cigarettes    Quit date: 11/29/1977  . Smokeless tobacco: Never Used  . Alcohol Use: No  . Drug Use: Yes     smokes occasional marijuana, no ready to quit  . Sexually Active: Not on file   Other Topics Concern  . Not on file   Social History Narrative   Lives alone     PHYSICAL EXAM   BP 114/80  Pulse 65  Ht 5' 5" (1.651 m)  Wt 165 lb (74.844 kg)  BMI 27.46 kg/m2  SpO2 97% Constitutional: He is oriented to person, place, and time. He appears well-developed and well-nourished. No distress.  HENT: No nasal discharge.  Head: Normocephalic and atraumatic.   Eyes: Pupils are equal and round. Right eye exhibits no discharge. Left eye exhibits no discharge.  Neck: Normal range of motion. Neck supple. No JVD present. No thyromegaly present.  Cardiovascular: Normal rate, regular rhythm, normal heart sounds and. Exam reveals no gallop and no friction rub. No murmur heard.  Pulmonary/Chest: Effort normal and breath sounds normal. No stridor. No respiratory distress. He has no wheezes. He has no rales. He exhibits no tenderness.  Abdominal: Soft. Bowel sounds are normal. He exhibits no distension. There is no tenderness. There is no rebound and no guarding.  Musculoskeletal: Normal range of motion. He exhibits no edema and no tenderness.  Neurological: He is alert and oriented to person, place, and time. Coordination normal.  Skin: Skin is warm and dry. No rash noted. He is not diaphoretic. No erythema. No pallor.  Psychiatric: He has a normal mood and affect. His behavior is normal. Judgment and thought content normal.       EKG: Ventricular paced rhythm with AV asynchrony. VVI pacemaker mode.   ASSESSMENT AND PLAN   

## 2011-08-16 NOTE — Interval H&P Note (Signed)
History and Physical Interval Note:  08/16/2011 2:17 PM  Jason Yu  has presented today for surgery, with the diagnosis of 3 degree Heart Block, pacemaker at [facility].  The various methods of treatment have been discussed with the patient and family. After consideration of risks, benefits and other options for treatment, the patient has consented to  Procedure(s) (LRB): PACEMAKER GENERATOR CHANGE (Left) as a surgical intervention .  The patients' history has been reviewed, patient examined, no change in status, stable for surgery.  I have reviewed the patients' chart and labs.  Questions were answered to the patient's satisfaction.    Risks, benefits,and alternatives to PPM gen change were discussed at length with patient by me.  He accepts the risks and wishes to proceed.  Hillis Range

## 2011-08-26 ENCOUNTER — Ambulatory Visit (INDEPENDENT_AMBULATORY_CARE_PROVIDER_SITE_OTHER): Payer: Medicaid Other | Admitting: *Deleted

## 2011-08-26 ENCOUNTER — Encounter: Payer: Self-pay | Admitting: Internal Medicine

## 2011-08-26 DIAGNOSIS — I442 Atrioventricular block, complete: Secondary | ICD-10-CM

## 2011-08-26 LAB — PACEMAKER DEVICE OBSERVATION
AL AMPLITUDE: 2.8 mv
AL THRESHOLD: 0.625 V
RV LEAD THRESHOLD: 1 V

## 2011-08-26 NOTE — Progress Notes (Signed)
Wound check-PPM 

## 2011-08-29 ENCOUNTER — Telehealth: Payer: Self-pay | Admitting: *Deleted

## 2011-08-29 NOTE — Telephone Encounter (Signed)
Patient will be having a  lumbar fusion procedure. Because of his low BP since having pacemaker placed, he wants to know if his medications can be adjusted so that when he is under the anesthesia, it won't get even lower. Please advise.

## 2011-08-30 NOTE — Telephone Encounter (Signed)
Patient informed and will be coming for nurse BP check on Monday.

## 2011-08-30 NOTE — Telephone Encounter (Signed)
Patient's blood pressures have always been running high according to my prior notes. If indeed his blood pressure is low and we need documentation of this. He neither send [place] readings or he can call for nurse visits to document this. If his blood pressure does run low i.e. below 100 mmHg systolic then we can hold amlodipine.

## 2011-09-02 ENCOUNTER — Ambulatory Visit (INDEPENDENT_AMBULATORY_CARE_PROVIDER_SITE_OTHER): Payer: Medicaid Other | Admitting: *Deleted

## 2011-09-02 ENCOUNTER — Encounter: Payer: Self-pay | Admitting: *Deleted

## 2011-09-02 VITALS — BP 150/72 | HR 60 | Ht 65.0 in | Wt 174.0 lb

## 2011-09-02 DIAGNOSIS — I10 Essential (primary) hypertension: Secondary | ICD-10-CM

## 2011-09-02 NOTE — Progress Notes (Signed)
Patient in office to have BP taken due to having low readings at home. Patient has taken medications and hasn't missed any doses. Denies having dizziness,chest pain or sob.

## 2011-09-02 NOTE — Progress Notes (Signed)
BP reviewed- patient should be able to proceed with procedure. Readings OK at home.

## 2011-09-30 ENCOUNTER — Other Ambulatory Visit: Payer: Self-pay | Admitting: *Deleted

## 2011-09-30 MED ORDER — ATORVASTATIN CALCIUM 20 MG PO TABS: 20.0000 mg | ORAL_TABLET | Freq: Every day | ORAL | Status: DC

## 2011-10-15 ENCOUNTER — Encounter (HOSPITAL_COMMUNITY): Payer: Self-pay | Admitting: Pharmacy Technician

## 2011-10-15 ENCOUNTER — Other Ambulatory Visit: Payer: Self-pay | Admitting: Neurosurgery

## 2011-10-17 NOTE — Pre-Procedure Instructions (Signed)
20 LANDIN TALLON  10/17/2011   Your procedure is scheduled on:  April 26  Report to [facility] at 0530 AM.  Call this number if you have problems the morning of surgery: [phone]   Remember:   Do not eat food:After Midnight.  May have clear liquids: up to 4 Hours before arrival.  Clear liquids include soda, tea, black coffee, apple or grape juice, broth.  Take these medicines the morning of surgery with A SIP OF WATER: Albuterol, Amlodipine, Valium (if needed), Norco, Metoprolol, Prilosec, Paxil   STOP Vitamin C, Flaxseed, Glucosamine, Multiple Vitamins, Fish Oil today (4/19)  Do not wear jewelry, make-up or nail polish.  Do not wear lotions, powders, or perfumes. You may wear deodorant.  Do not shave 48 hours prior to surgery.  Do not bring valuables to the hospital.  Contacts, dentures or bridgework may not be worn into surgery.  Leave suitcase in the car. After surgery it may be brought to your room.  For patients admitted to the hospital, checkout time is 11:00 AM the day of discharge.   Patients discharged the day of surgery will not be allowed to drive home.    Special Instructions: CHG Shower Use Special Wash: 1/2 bottle night before surgery and 1/2 bottle morning of surgery.   Please read over the following fact sheets that you were given: Pain Booklet, Coughing and Deep Breathing, Blood Transfusion Information and Surgical Site Infection Prevention

## 2011-10-18 ENCOUNTER — Ambulatory Visit (HOSPITAL_COMMUNITY)
Admission: RE | Admit: 2011-10-18 | Discharge: 2011-10-18 | Disposition: A | Discharge status: Home / Self Care | Payer: Medicaid Other | Source: Ambulatory Visit | Attending: Anesthesiology | Admitting: Anesthesiology

## 2011-10-18 ENCOUNTER — Ambulatory Visit (INDEPENDENT_AMBULATORY_CARE_PROVIDER_SITE_OTHER): Payer: Medicaid Other | Admitting: Nurse Practitioner

## 2011-10-18 ENCOUNTER — Encounter (HOSPITAL_COMMUNITY): Payer: Self-pay

## 2011-10-18 ENCOUNTER — Encounter: Payer: Self-pay | Admitting: Nurse Practitioner

## 2011-10-18 ENCOUNTER — Encounter (HOSPITAL_COMMUNITY)
Admission: RE | Admit: 2011-10-18 | Discharge: 2011-10-18 | Disposition: A | Discharge status: Home / Self Care | Payer: Medicaid Other | Source: Ambulatory Visit | Attending: Neurosurgery | Admitting: Neurosurgery

## 2011-10-18 VITALS — BP 154/83 | HR 60 | Ht 65.0 in | Wt 169.0 lb

## 2011-10-18 DIAGNOSIS — R55 Syncope and collapse: Secondary | ICD-10-CM | POA: Insufficient documentation

## 2011-10-18 DIAGNOSIS — Z01818 Encounter for other preprocedural examination: Secondary | ICD-10-CM | POA: Insufficient documentation

## 2011-10-18 DIAGNOSIS — I442 Atrioventricular block, complete: Secondary | ICD-10-CM

## 2011-10-18 DIAGNOSIS — Z01812 Encounter for preprocedural laboratory examination: Secondary | ICD-10-CM | POA: Insufficient documentation

## 2011-10-18 DIAGNOSIS — R079 Chest pain, unspecified: Secondary | ICD-10-CM | POA: Insufficient documentation

## 2011-10-18 HISTORY — DX: Shortness of breath: R06.02

## 2011-10-18 HISTORY — DX: Inflammatory liver disease, unspecified: K75.9

## 2011-10-18 HISTORY — DX: Gastro-esophageal reflux disease without esophagitis: K21.9

## 2011-10-18 HISTORY — DX: Adverse effect of unspecified anesthetic, initial encounter: T41.45XA

## 2011-10-18 HISTORY — DX: Pneumonia, unspecified organism: J18.9

## 2011-10-18 LAB — COMPREHENSIVE METABOLIC PANEL
ALT: 21 U/L (ref 0–53)
AST: 14 U/L (ref 0–37)
Alkaline Phosphatase: 84 U/L (ref 39–117)
CO2: 24 mEq/L (ref 19–32)
Calcium: 10 mg/dL (ref 8.4–10.5)
Potassium: 4.2 mEq/L (ref 3.5–5.1)
Sodium: 136 mEq/L (ref 135–145)
Total Protein: 7.7 g/dL (ref 6.0–8.3)

## 2011-10-18 LAB — CBC
HCT: 45.7 % (ref 39.0–52.0)
Hemoglobin: 16.1 g/dL (ref 13.0–17.0)
MCH: 32.4 pg (ref 26.0–34.0)
MCHC: 35.2 g/dL (ref 30.0–36.0)

## 2011-10-18 LAB — SURGICAL PCR SCREEN: Staphylococcus aureus: NEGATIVE

## 2011-10-18 NOTE — Progress Notes (Signed)
Pt reported that he will see Cardiologist for heart clearance after appt.//L. Yaremi Stahlman,RN

## 2011-10-18 NOTE — Pre-Procedure Instructions (Signed)
20 Jason Yu  10/18/2011   Your procedure is scheduled on:  April 26  Report to [facility] at 0530 AM.  Call this number if you have problems the morning of surgery: [phone]   Remember:   Do not eat food:After Midnight.  May have clear liquids: up to 4 Hours before arrival(nothing after 3:15AM).  Clear liquids include soda, tea, black coffee, apple or grape juice, broth.  Take these medicines the morning of surgery with A SIP OF WATER: Albuterol, Amlodipine, Valium (if needed), Norco, Metoprolol, Prilosec, Paxil   STOP Vitamin C, Flaxseed, Glucosamine, Multiple Vitamins, Fish Oil today (4/19)  Do not wear jewelry, make-up or nail polish.  Do not wear lotions, powders, or perfumes. You may wear deodorant.  Do not shave 48 hours prior to surgery.  Do not bring valuables to the hospital.  Contacts, dentures or bridgework may not be worn into surgery.  Leave suitcase in the car. After surgery it may be brought to your room.  For patients admitted to the hospital, checkout time is 11:00 AM the day of discharge.   Patients discharged the day of surgery will not be allowed to drive home.  Name and phone number of your driver:[phone]).  Special Instructions: CHG Shower Use Special Wash: 1/2 bottle night before surgery and 1/2 bottle morning of surgery.   Please read over the following fact sheets that you were given: Pain Booklet, Coughing and Deep Breathing, Blood Transfusion Information and Surgical Site Infection Prevention

## 2011-10-18 NOTE — Progress Notes (Signed)
[employer] called.  Representative Leta Jungling, notified of impending surgery & time.//L. Willona Phariss,RN

## 2011-10-18 NOTE — Pre-Procedure Instructions (Signed)
Jason Yu  10/18/2011   Your procedure is scheduled on:  Friday, 10/25/11 @10 :20AM  Report to [facility] at &:15 AM.  Call this number if you have problems the morning of surgery: [phone]   Remember:   Do not eat food:After Midnight.  May have clear liquids: up to 4 Hours before arrival( nothing after 3:15AM).  Clear liquids include soda, tea, black coffee, apple or grape juice, broth.  Take these medicines the morning of surgery with A SIP OF WATER: Amlodipine, Paxil, Hydrocodone-Acetaminophen, Albuterol, Valuim, Metoprolol, Prilosec.   Do not wear jewelry, make-up or nail polish.  Do not wear lotions, powders, or perfumes. You may wear deodorant.  Do not shave 48 hours prior to surgery.  Do not bring valuables to the hospital.  Contacts, dentures or bridgework may not be worn into surgery.  Leave suitcase in the car. After surgery it may be brought to your room.  For patients admitted to the hospital, checkout time is 11:00 AM the day of discharge.   Patients discharged the day of surgery will not be allowed to drive home.  Name and phone number of your driver: [employer] #[phone]  Special Instructions: CHG Shower Use Special Wash: 1/2 bottle night before surgery and 1/2 bottle morning of surgery.   Please read over the following fact sheets that you were given: Pain Booklet, Coughing and Deep Breathing, Blood Transfusion Information and Surgical Site Infection Prevention

## 2011-10-18 NOTE — Progress Notes (Signed)
Jason Yu from [employer] requested that if ICD orders received, to call [employer]. Note left on outside of chart.//L. Mathew Storck,RN

## 2011-10-18 NOTE — Progress Notes (Signed)
Noted that cardiac clearance note from [facility] in Epic. Copy placed in chart.//L. Marelyn Rouser,RN

## 2011-10-18 NOTE — Progress Notes (Signed)
Patient Name: Jason Yu Date of Encounter: 10/18/2011  Primary Care Provider:  Ernestine Conrad, MD, MD Primary Cardiologist:  Reece Agar. Degent MD/J. Allred, MD  Patient Profile  61 year old male with history of complete heart block status post recent generator change out who presents for preoperative clearance.  Problem List   Past Medical History  Diagnosis Date  . Complete heart block     with prior syncope s/p PPM 2004 in [place] (MDT Kappa);  08/16/2011 PPM Gen. Change: MDT Adapta L ADDDR1, [id number] H.  . HTN (hypertension), benign   . Syncope     a.  2001 - normal cath;  b. 2012 Normal Myoview, EF 58%.  Marland Kitchen Anxiety   . HLD (hyperlipidemia)   . DDD (degenerative disc disease)     with multiple prior back surgeries   . Complication of anesthesia 05/2011    had difficulty breathing after surgery=C8 nerve root compression  . Asthma     hasn't used inhaler at least 4-5 months.  . Shortness of breath     Hx of during childhood-asthma.  . Pneumonia last 1998    has had 8x  . Hepatitis     Hx Hepatitis A & B  . GERD (gastroesophageal reflux disease)    Past Surgical History  Procedure Date  . Cervical neck surgeries   . Pacemaker insertion     Implanted 2004 in [place]  . Cardiac catheterization 2001    no significant CAD  . Insert / replace / remove pacemaker 08/2011    initially inserted 12/2002  . Back surgery 08/2009  . Shoulder arthroscopy distal clavicle excision and open rotator cuff repair 2009    right  . Shoulder arthroscopy distal clavicle excision and open rotator cuff repair 2010    Left  . Carpal tunnel release 1979  . Ulnar tunnel release 1980's(late) or early 1990's    Allergies  Allergies  Allergen Reactions  . Other Other (See Comments)    Nuclear Stress Medicine- made him feel" out of touch" w/ reality.  . Prednisone Other (See Comments)    REACTION: pain in joints  . Codeine Rash    REACTION: rash,itch    HPI  61 year old male with the above  problem list.  He is recently status post generator change out secondary to end-of-life status on his old [employer] pacemaker.  Since his pacemaker change in February he is done well from a cardiac standpoint without chest pain or dyspnea.  He tries to remain active around the home however he is limited by lower back pain and is pending lumbar fusion.  He presents today for surgical clearance.  Home Medications  Prior to Admission medications   Medication Sig Start Date End Date Taking? Authorizing Provider  albuterol (PROVENTIL) (2.5 MG/3ML) 0.083% nebulizer solution Take 2.5 mg by nebulization every 6 (six) hours as needed. For shortness of breath   Yes Historical Provider, MD  amLODipine (NORVASC) 10 MG tablet Take 5 mg by mouth daily.    Yes Historical Provider, MD  Ascorbic Acid (VITAMIN C) 1000 MG tablet Take 1,000 mg by mouth daily.     Yes Historical Provider, MD  atorvastatin (LIPITOR) 20 MG tablet Take 20 mg by mouth at bedtime. 09/30/11  Yes June Leap, MD  diazepam (VALIUM) 10 MG tablet Take 10 mg by mouth every 8 (eight) hours as needed. For anxiety   Yes Historical Provider, MD  Flaxseed, Linseed, (FLAX SEED OIL) 1000 MG CAPS Take 1 capsule by mouth  daily. 1300 mg   Yes Historical Provider, MD  Glucosamine HCl 1000 MG TABS Take 2 tablets by mouth daily.     Yes Historical Provider, MD  HYDROcodone-acetaminophen (NORCO) 10-325 MG per tablet Take 1 tablet by mouth every 8 (eight) hours as needed. For pain   Yes Historical Provider, MD  ketoconazole (NIZORAL) 2 % cream Apply 1 application topically daily. For mustache rash   Yes Historical Provider, MD  metoprolol (TOPROL-XL) 100 MG 24 hr tablet Take 50 mg by mouth every morning.    Yes Historical Provider, MD  Multiple Vitamin (MULTIVITAMIN) tablet Take 1 tablet by mouth daily.     Yes Historical Provider, MD  Omega-3 Fatty Acids (FISH OIL) 1000 MG CAPS Take 3 capsules by mouth daily.     Yes Historical Provider, MD  omeprazole  (PRILOSEC) 20 MG capsule Take 20 mg by mouth daily.     Yes Historical Provider, MD  PARoxetine (PAXIL) 10 MG tablet Take 10 mg by mouth daily.    Yes Historical Provider, MD  potassium chloride (K-DUR,KLOR-CON) 10 MEQ tablet Take 10 mEq by mouth 2 (two) times daily.   Yes Historical Provider, MD    Review of Systems Low back pain.  No chest pain, sob, n, v, dizziness, syncope, edema, early satiety, dysuria, dark stools, blood in stools, diarrhea, rash/skin changes, fevers, chills, wt loss/gain.  Otherwise all systems reviewed and negative.  Physical Exam  Blood pressure 154/83, pulse 60, height 5\' 5"  (1.651 m), weight 169 lb (76.658 kg).  General: Pleasant, NAD Psych: Normal affect. Neuro: Alert and oriented X 3. Moves all extremities spontaneously. HEENT: Normal  Neck: Supple without bruits or JVD. Lungs:  Resp regular and unlabored, CTA. Heart: RRR no s3, s4, or murmurs. Abdomen: Soft, non-tender, non-distended, BS + x 4.  Extremities: No clubbing, cyanosis or edema. DP/PT/Radials 2+ and equal bilaterally.  Accessory Clinical Findings  ECG - 100% A-V paced and captured.  Assessment & Plan  1.  CHB w/ h/o syncope:  S/p generator change out in February.  Site has healed well and he has f/u with Dr. Johney Frame in May.  2.  Spinal Stenosis/Back Pain:  Pending surgery.  Surgical clearance previously provided by Dr. Andee Lineman (telephone note 09/02/2011).  From cardiac standpoint, pt is quite fxnl w/o chest pain or dyspnea.  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  Ok to proceed with surgery.  Cont bb perioperatively.   Nicolasa Ducking, NP 10/18/2011, 3:09 PM

## 2011-10-22 NOTE — Progress Notes (Signed)
Received ICD form from  and placed on chart. Beckey Downing with [employer] to notify her about patient's surgery on 4/26. Note on front of chart.

## 2011-10-24 MED ORDER — CEFAZOLIN SODIUM-DEXTROSE 2-3 GM-% IV SOLR
2.0000 g | INTRAVENOUS | Status: AC
  Administered 2011-10-25: 2 g via INTRAVENOUS
  Filled 2011-10-24: qty 50

## 2011-10-25 ENCOUNTER — Ambulatory Visit (HOSPITAL_COMMUNITY): Payer: Medicaid Other | Admitting: *Deleted

## 2011-10-25 ENCOUNTER — Encounter (HOSPITAL_COMMUNITY): Payer: Self-pay | Admitting: Surgery

## 2011-10-25 ENCOUNTER — Inpatient Hospital Stay (HOSPITAL_COMMUNITY)
Admission: RE | Admit: 2011-10-25 | Discharge: 2011-10-30 | DRG: 460 | Disposition: A | Discharge status: Home Health | Payer: Medicaid Other | Source: Ambulatory Visit | Attending: Neurosurgery | Admitting: Neurosurgery

## 2011-10-25 ENCOUNTER — Encounter (HOSPITAL_COMMUNITY)
Admission: RE | Disposition: A | Discharge status: Home Health | Payer: Self-pay | Source: Ambulatory Visit | Attending: Neurosurgery

## 2011-10-25 ENCOUNTER — Ambulatory Visit (HOSPITAL_COMMUNITY): Payer: Medicaid Other

## 2011-10-25 ENCOUNTER — Encounter (HOSPITAL_COMMUNITY): Payer: Self-pay | Admitting: *Deleted

## 2011-10-25 DIAGNOSIS — M5126 Other intervertebral disc displacement, lumbar region: Secondary | ICD-10-CM | POA: Diagnosis present

## 2011-10-25 DIAGNOSIS — M5137 Other intervertebral disc degeneration, lumbosacral region: Secondary | ICD-10-CM | POA: Diagnosis present

## 2011-10-25 DIAGNOSIS — R209 Unspecified disturbances of skin sensation: Secondary | ICD-10-CM | POA: Diagnosis present

## 2011-10-25 DIAGNOSIS — J45909 Unspecified asthma, uncomplicated: Secondary | ICD-10-CM | POA: Diagnosis present

## 2011-10-25 DIAGNOSIS — K219 Gastro-esophageal reflux disease without esophagitis: Secondary | ICD-10-CM | POA: Diagnosis present

## 2011-10-25 DIAGNOSIS — I10 Essential (primary) hypertension: Secondary | ICD-10-CM | POA: Diagnosis present

## 2011-10-25 DIAGNOSIS — M47817 Spondylosis without myelopathy or radiculopathy, lumbosacral region: Principal | ICD-10-CM | POA: Diagnosis present

## 2011-10-25 DIAGNOSIS — M961 Postlaminectomy syndrome, not elsewhere classified: Secondary | ICD-10-CM | POA: Diagnosis present

## 2011-10-25 DIAGNOSIS — Z950 Presence of cardiac pacemaker: Secondary | ICD-10-CM

## 2011-10-25 DIAGNOSIS — M51379 Other intervertebral disc degeneration, lumbosacral region without mention of lumbar back pain or lower extremity pain: Secondary | ICD-10-CM | POA: Diagnosis present

## 2011-10-25 SURGERY — POSTERIOR LUMBAR FUSION 2 LEVEL
Anesthesia: General | Site: Spine Lumbar | Wound class: Clean

## 2011-10-25 MED ORDER — LACTATED RINGERS IV SOLN
INTRAVENOUS | Status: DC | PRN
  Administered 2011-10-25 (×2): via INTRAVENOUS

## 2011-10-25 MED ORDER — MENTHOL 3 MG MT LOZG
1.0000 | LOZENGE | OROMUCOSAL | Status: DC | PRN
  Administered 2011-10-27: 3 mg via ORAL
  Filled 2011-10-25: qty 9

## 2011-10-25 MED ORDER — NEOSTIGMINE METHYLSULFATE 1 MG/ML IJ SOLN
INTRAMUSCULAR | Status: DC | PRN
  Administered 2011-10-25: 4 mg via INTRAVENOUS

## 2011-10-25 MED ORDER — DIAZEPAM 5 MG PO TABS: 10.0000 mg | ORAL_TABLET | Freq: Three times a day (TID) | ORAL | Status: DC | PRN

## 2011-10-25 MED ORDER — PROPOFOL 10 MG/ML IV EMUL
INTRAVENOUS | Status: DC | PRN
  Administered 2011-10-25: 200 mg via INTRAVENOUS

## 2011-10-25 MED ORDER — HYDROMORPHONE HCL PF 1 MG/ML IJ SOLN
0.5000 mg | INTRAMUSCULAR | Status: DC | PRN
  Administered 2011-10-25 (×2): 0.5 mg via INTRAVENOUS

## 2011-10-25 MED ORDER — PANTOPRAZOLE SODIUM 40 MG PO TBEC
40.0000 mg | DELAYED_RELEASE_TABLET | Freq: Every day | ORAL | Status: DC
  Administered 2011-10-26 – 2011-10-27 (×2): 40 mg via ORAL
  Filled 2011-10-25 (×2): qty 1

## 2011-10-25 MED ORDER — FENTANYL CITRATE 0.05 MG/ML IJ SOLN
INTRAMUSCULAR | Status: DC | PRN
  Administered 2011-10-25: 100 ug via INTRAVENOUS
  Administered 2011-10-25 (×5): 50 ug via INTRAVENOUS
  Administered 2011-10-25: 100 ug via INTRAVENOUS
  Administered 2011-10-25 (×2): 50 ug via INTRAVENOUS
  Administered 2011-10-25: 100 ug via INTRAVENOUS

## 2011-10-25 MED ORDER — KCL IN DEXTROSE-NACL 20-5-0.45 MEQ/L-%-% IV SOLN
INTRAVENOUS | Status: AC
  Filled 2011-10-25: qty 1000

## 2011-10-25 MED ORDER — HYDROCODONE-ACETAMINOPHEN 10-325 MG PO TABS: 1.0000 | ORAL_TABLET | ORAL | Status: DC | PRN

## 2011-10-25 MED ORDER — SODIUM CHLORIDE 0.9 % IJ SOLN: 3.0000 mL | INTRAMUSCULAR | Status: DC | PRN

## 2011-10-25 MED ORDER — DIAZEPAM 5 MG PO TABS: 5.0000 mg | ORAL_TABLET | Freq: Four times a day (QID) | ORAL | Status: DC | PRN

## 2011-10-25 MED ORDER — DIAZEPAM 5 MG PO TABS
10.0000 mg | ORAL_TABLET | Freq: Three times a day (TID) | ORAL | Status: DC | PRN
  Administered 2011-10-25: 10 mg via ORAL
  Administered 2011-10-26 (×2): 5 mg via ORAL
  Administered 2011-10-28 – 2011-10-29 (×2): 10 mg via ORAL
  Filled 2011-10-25 (×5): qty 2

## 2011-10-25 MED ORDER — ONDANSETRON HCL 4 MG/2ML IJ SOLN
4.0000 mg | Freq: Four times a day (QID) | INTRAMUSCULAR | Status: DC | PRN
  Administered 2011-10-26: 4 mg via INTRAVENOUS
  Filled 2011-10-25: qty 2

## 2011-10-25 MED ORDER — MIDAZOLAM HCL 5 MG/5ML IJ SOLN
INTRAMUSCULAR | Status: DC | PRN
  Administered 2011-10-25 (×2): 2 mg via INTRAVENOUS

## 2011-10-25 MED ORDER — ALBUTEROL SULFATE (5 MG/ML) 0.5% IN NEBU
2.5000 mg | INHALATION_SOLUTION | Freq: Four times a day (QID) | RESPIRATORY_TRACT | Status: DC
  Administered 2011-10-25 – 2011-10-26 (×3): 2.5 mg via RESPIRATORY_TRACT
  Filled 2011-10-25 (×3): qty 0.5

## 2011-10-25 MED ORDER — LIDOCAINE-EPINEPHRINE 1 %-1:100000 IJ SOLN
INTRAMUSCULAR | Status: DC | PRN
  Administered 2011-10-25: 5 mL

## 2011-10-25 MED ORDER — HYDROMORPHONE HCL PF 1 MG/ML IJ SOLN: 0.2500 mg | INTRAMUSCULAR | Status: DC | PRN

## 2011-10-25 MED ORDER — GLYCOPYRROLATE 0.2 MG/ML IJ SOLN
INTRAMUSCULAR | Status: DC | PRN
  Administered 2011-10-25: .7 mg via INTRAVENOUS

## 2011-10-25 MED ORDER — METOPROLOL SUCCINATE ER 50 MG PO TB24
50.0000 mg | ORAL_TABLET | Freq: Every day | ORAL | Status: DC
  Administered 2011-10-28 – 2011-10-29 (×2): 50 mg via ORAL
  Filled 2011-10-25 (×5): qty 1

## 2011-10-25 MED ORDER — SODIUM CHLORIDE 0.9 % IV SOLN: 250.0000 mL | INTRAVENOUS | Status: DC

## 2011-10-25 MED ORDER — AMLODIPINE BESYLATE 5 MG PO TABS
5.0000 mg | ORAL_TABLET | Freq: Every day | ORAL | Status: DC
  Administered 2011-10-28 – 2011-10-29 (×2): 5 mg via ORAL
  Filled 2011-10-25 (×6): qty 1

## 2011-10-25 MED ORDER — ONE-DAILY MULTI VITAMINS PO TABS: 1.0000 | ORAL_TABLET | Freq: Every day | ORAL | Status: DC

## 2011-10-25 MED ORDER — HEMOSTATIC AGENTS (NO CHARGE) OPTIME
TOPICAL | Status: DC | PRN
  Administered 2011-10-25: 1 via TOPICAL

## 2011-10-25 MED ORDER — ONDANSETRON HCL 4 MG/2ML IJ SOLN
INTRAMUSCULAR | Status: DC | PRN
  Administered 2011-10-25 (×2): 4 mg via INTRAVENOUS

## 2011-10-25 MED ORDER — CEFAZOLIN SODIUM 1-5 GM-% IV SOLN
1.0000 g | Freq: Three times a day (TID) | INTRAVENOUS | Status: AC
  Administered 2011-10-25 – 2011-10-26 (×2): 1 g via INTRAVENOUS
  Filled 2011-10-25 (×2): qty 50

## 2011-10-25 MED ORDER — KETOCONAZOLE 2 % EX CREA
1.0000 "application " | TOPICAL_CREAM | Freq: Every day | CUTANEOUS | Status: DC
  Administered 2011-10-28 – 2011-10-29 (×2): 1 via TOPICAL
  Filled 2011-10-25 (×2): qty 15

## 2011-10-25 MED ORDER — HYDROMORPHONE HCL PF 1 MG/ML IJ SOLN
INTRAMUSCULAR | Status: AC
  Filled 2011-10-25: qty 1

## 2011-10-25 MED ORDER — POTASSIUM CHLORIDE CRYS ER 10 MEQ PO TBCR
10.0000 meq | EXTENDED_RELEASE_TABLET | Freq: Two times a day (BID) | ORAL | Status: DC
  Administered 2011-10-25 – 2011-10-29 (×7): 10 meq via ORAL
  Filled 2011-10-25 (×11): qty 1

## 2011-10-25 MED ORDER — 0.9 % SODIUM CHLORIDE (POUR BTL) OPTIME
TOPICAL | Status: DC | PRN
  Administered 2011-10-25: 1000 mL

## 2011-10-25 MED ORDER — PHENOL 1.4 % MT LIQD: 1.0000 | OROMUCOSAL | Status: DC | PRN

## 2011-10-25 MED ORDER — SODIUM CHLORIDE 0.9 % IJ SOLN: 3.0000 mL | Freq: Two times a day (BID) | INTRAMUSCULAR | Status: DC

## 2011-10-25 MED ORDER — KCL IN DEXTROSE-NACL 20-5-0.45 MEQ/L-%-% IV SOLN
INTRAVENOUS | Status: DC
  Administered 2011-10-26: 22:00:00 via INTRAVENOUS
  Filled 2011-10-25 (×5): qty 1000

## 2011-10-25 MED ORDER — DIPHENHYDRAMINE HCL 50 MG/ML IJ SOLN: 12.5000 mg | Freq: Four times a day (QID) | INTRAMUSCULAR | Status: DC | PRN

## 2011-10-25 MED ORDER — ROCURONIUM BROMIDE 100 MG/10ML IV SOLN
INTRAVENOUS | Status: DC | PRN
  Administered 2011-10-25: 5 mg via INTRAVENOUS
  Administered 2011-10-25: 10 mg via INTRAVENOUS
  Administered 2011-10-25: 5 mg via INTRAVENOUS
  Administered 2011-10-25: 50 mg via INTRAVENOUS

## 2011-10-25 MED ORDER — HYDROMORPHONE 0.3 MG/ML IV SOLN
INTRAVENOUS | Status: DC
  Administered 2011-10-25: 3.9 mg via INTRAVENOUS
  Administered 2011-10-25: 2.1 mg via INTRAVENOUS
  Administered 2011-10-25: 7.5 mg via INTRAVENOUS
  Administered 2011-10-26: 1.5 mg via INTRAVENOUS
  Administered 2011-10-26: 5.4 mg via INTRAVENOUS
  Administered 2011-10-26: 1.2 mg via INTRAVENOUS
  Administered 2011-10-26: 16:00:00 via INTRAVENOUS
  Administered 2011-10-26: 1.8 mg via INTRAVENOUS
  Administered 2011-10-27: 1.5 mg via INTRAVENOUS
  Administered 2011-10-27: 1.2 mg via INTRAVENOUS
  Administered 2011-10-27: 4 mg via INTRAVENOUS
  Filled 2011-10-25 (×2): qty 25

## 2011-10-25 MED ORDER — DIPHENHYDRAMINE HCL 12.5 MG/5ML PO ELIX: 12.5000 mg | ORAL_SOLUTION | Freq: Four times a day (QID) | ORAL | Status: DC | PRN

## 2011-10-25 MED ORDER — ACETAMINOPHEN 325 MG PO TABS: 650.0000 mg | ORAL_TABLET | ORAL | Status: DC | PRN

## 2011-10-25 MED ORDER — SIMVASTATIN 20 MG PO TABS
20.0000 mg | ORAL_TABLET | Freq: Every day | ORAL | Status: DC
  Administered 2011-10-28 – 2011-10-29 (×2): 20 mg via ORAL
  Filled 2011-10-25 (×6): qty 1

## 2011-10-25 MED ORDER — PAROXETINE HCL 10 MG PO TABS
10.0000 mg | ORAL_TABLET | Freq: Every day | ORAL | Status: DC
  Administered 2011-10-26 – 2011-10-29 (×4): 10 mg via ORAL
  Filled 2011-10-25 (×6): qty 1

## 2011-10-25 MED ORDER — ACETAMINOPHEN 650 MG RE SUPP: 650.0000 mg | RECTAL | Status: DC | PRN

## 2011-10-25 MED ORDER — SODIUM CHLORIDE 0.9 % IR SOLN
Status: DC | PRN
  Administered 2011-10-25: 12:00:00

## 2011-10-25 MED ORDER — HETASTARCH-ELECTROLYTES 6 % IV SOLN
INTRAVENOUS | Status: DC | PRN
  Administered 2011-10-25: 13:00:00 via INTRAVENOUS

## 2011-10-25 MED ORDER — HYDROMORPHONE 0.3 MG/ML IV SOLN
INTRAVENOUS | Status: AC
  Administered 2011-10-25: 15:00:00
  Filled 2011-10-25: qty 25

## 2011-10-25 MED ORDER — OXYCODONE-ACETAMINOPHEN 5-325 MG PO TABS
1.0000 | ORAL_TABLET | ORAL | Status: DC | PRN
  Administered 2011-10-26: 2 via ORAL
  Filled 2011-10-25 (×3): qty 2

## 2011-10-25 MED ORDER — ONDANSETRON HCL 4 MG/2ML IJ SOLN: 4.0000 mg | INTRAMUSCULAR | Status: DC | PRN

## 2011-10-25 MED ORDER — BACITRACIN 50000 UNITS IM SOLR
INTRAMUSCULAR | Status: AC
  Filled 2011-10-25: qty 1

## 2011-10-25 MED ORDER — SODIUM CHLORIDE 0.9 % IV SOLN
INTRAVENOUS | Status: AC
  Filled 2011-10-25: qty 500

## 2011-10-25 MED ORDER — MORPHINE SULFATE 2 MG/ML IJ SOLN: 0.0500 mg/kg | INTRAMUSCULAR | Status: DC | PRN

## 2011-10-25 MED ORDER — ZOLPIDEM TARTRATE 10 MG PO TABS: 10.0000 mg | ORAL_TABLET | Freq: Every evening | ORAL | Status: DC | PRN

## 2011-10-25 MED ORDER — ADULT MULTIVITAMIN W/MINERALS CH
1.0000 | ORAL_TABLET | Freq: Every day | ORAL | Status: DC
  Administered 2011-10-25 – 2011-10-29 (×3): 1 via ORAL
  Filled 2011-10-25 (×6): qty 1

## 2011-10-25 MED ORDER — HYDROCODONE-ACETAMINOPHEN 5-325 MG PO TABS: 1.0000 | ORAL_TABLET | ORAL | Status: DC | PRN

## 2011-10-25 MED ORDER — THROMBIN 20000 UNITS EX KIT
PACK | CUTANEOUS | Status: DC | PRN
  Administered 2011-10-25: 20000 [IU] via TOPICAL

## 2011-10-25 MED ORDER — SODIUM CHLORIDE 0.9 % IJ SOLN: 9.0000 mL | INTRAMUSCULAR | Status: DC | PRN

## 2011-10-25 MED ORDER — VITAMIN C 500 MG PO TABS
1000.0000 mg | ORAL_TABLET | Freq: Every day | ORAL | Status: DC
  Administered 2011-10-25 – 2011-10-29 (×3): 1000 mg via ORAL
  Filled 2011-10-25 (×6): qty 2

## 2011-10-25 MED ORDER — BUPIVACAINE HCL (PF) 0.5 % IJ SOLN
INTRAMUSCULAR | Status: DC | PRN
  Administered 2011-10-25: 5 mL

## 2011-10-25 MED ORDER — ONDANSETRON HCL 4 MG/2ML IJ SOLN: 4.0000 mg | Freq: Once | INTRAMUSCULAR | Status: DC | PRN

## 2011-10-25 MED ORDER — NALOXONE HCL 0.4 MG/ML IJ SOLN: 0.4000 mg | INTRAMUSCULAR | Status: DC | PRN

## 2011-10-25 SURGICAL SUPPLY — 71 items
BAG DECANTER FOR FLEXI CONT (MISCELLANEOUS) ×2 IMPLANT
BENZOIN TINCTURE PRP APPL 2/3 (GAUZE/BANDAGES/DRESSINGS) ×2 IMPLANT
BLADE SURG ROTATE 9660 (MISCELLANEOUS) ×2 IMPLANT
BONE VOID FILLER STRIP 10CC (Bone Implant) ×4 IMPLANT
BUR MATCHSTICK NEURO 3.0 LAGG (BURR) ×2 IMPLANT
BUR PRECISION FLUTE 5.0 (BURR) ×2 IMPLANT
CAGE 9MM (Cage) ×8 IMPLANT
CANISTER SUCTION 2500CC (MISCELLANEOUS) IMPLANT
CLOTH BEACON ORANGE TIMEOUT ST (SAFETY) ×2 IMPLANT
CONT SPEC 4OZ CLIKSEAL STRL BL (MISCELLANEOUS) ×4 IMPLANT
COVER BACK TABLE 24X17X13 BIG (DRAPES) ×2 IMPLANT
COVER TABLE BACK 60X90 (DRAPES) IMPLANT
DERMABOND ADVANCED (GAUZE/BANDAGES/DRESSINGS) ×1
DERMABOND ADVANCED .7 DNX12 (GAUZE/BANDAGES/DRESSINGS) ×1 IMPLANT
DRAPE C-ARM 42X72 X-RAY (DRAPES) ×4 IMPLANT
DRAPE LAPAROTOMY 100X72X124 (DRAPES) ×2 IMPLANT
DRAPE POUCH INSTRU U-SHP 10X18 (DRAPES) ×2 IMPLANT
DRAPE SURG 17X23 STRL (DRAPES) ×2 IMPLANT
DRESSING TELFA 8X3 (GAUZE/BANDAGES/DRESSINGS) ×2 IMPLANT
DURAPREP 26ML APPLICATOR (WOUND CARE) ×2 IMPLANT
ELECT REM PT RETURN 9FT ADLT (ELECTROSURGICAL) ×2
ELECTRODE REM PT RTRN 9FT ADLT (ELECTROSURGICAL) ×1 IMPLANT
GAUZE SPONGE 4X4 16PLY XRAY LF (GAUZE/BANDAGES/DRESSINGS) IMPLANT
GLOVE BIO SURGEON STRL SZ8 (GLOVE) ×4 IMPLANT
GLOVE BIOGEL PI IND STRL 8 (GLOVE) ×2 IMPLANT
GLOVE BIOGEL PI IND STRL 8.5 (GLOVE) ×2 IMPLANT
GLOVE BIOGEL PI INDICATOR 8 (GLOVE) ×2
GLOVE BIOGEL PI INDICATOR 8.5 (GLOVE) ×2
GLOVE ECLIPSE 8.0 STRL XLNG CF (GLOVE) ×4 IMPLANT
GLOVE EXAM NITRILE LRG STRL (GLOVE) IMPLANT
GLOVE EXAM NITRILE MD LF STRL (GLOVE) IMPLANT
GLOVE EXAM NITRILE XL STR (GLOVE) IMPLANT
GLOVE EXAM NITRILE XS STR PU (GLOVE) IMPLANT
GOWN BRE IMP SLV AUR LG STRL (GOWN DISPOSABLE) IMPLANT
GOWN BRE IMP SLV AUR XL STRL (GOWN DISPOSABLE) ×6 IMPLANT
GOWN STRL REIN 2XL LVL4 (GOWN DISPOSABLE) IMPLANT
KIT BASIN OR (CUSTOM PROCEDURE TRAY) ×2 IMPLANT
KIT INFUSE MEDIUM (Orthopedic Implant) ×2 IMPLANT
KIT POSITION SURG JACKSON T1 (MISCELLANEOUS) IMPLANT
KIT ROOM TURNOVER OR (KITS) ×2 IMPLANT
MILL MEDIUM DISP (BLADE) ×2 IMPLANT
NEEDLE HYPO 25X1 1.5 SAFETY (NEEDLE) ×2 IMPLANT
NEEDLE SPNL 18GX3.5 QUINCKE PK (NEEDLE) ×2 IMPLANT
NS IRRIG 1000ML POUR BTL (IV SOLUTION) ×2 IMPLANT
PACK LAMINECTOMY NEURO (CUSTOM PROCEDURE TRAY) ×2 IMPLANT
PAD ARMBOARD 7.5X6 YLW CONV (MISCELLANEOUS) ×6 IMPLANT
PATTIES SURGICAL .5 X.5 (GAUZE/BANDAGES/DRESSINGS) IMPLANT
PATTIES SURGICAL .5 X1 (DISPOSABLE) IMPLANT
PATTIES SURGICAL 1X1 (DISPOSABLE) IMPLANT
RASP 3.0MM (RASP) ×2 IMPLANT
ROD 80MM (Rod) ×4 IMPLANT
SCREW 50MM (Screw) ×12 IMPLANT
SCREW SET SPINAL STD HEXALOBE (Screw) ×12 IMPLANT
SPONGE GAUZE 4X4 12PLY (GAUZE/BANDAGES/DRESSINGS) ×2 IMPLANT
SPONGE LAP 4X18 X RAY DECT (DISPOSABLE) IMPLANT
SPONGE SURGIFOAM ABS GEL 100 (HEMOSTASIS) ×2 IMPLANT
STAPLER SKIN PROX WIDE 3.9 (STAPLE) IMPLANT
STRIP CLOSURE SKIN 1/2X4 (GAUZE/BANDAGES/DRESSINGS) ×2 IMPLANT
SUT VIC AB 1 CT1 18XBRD ANBCTR (SUTURE) ×2 IMPLANT
SUT VIC AB 1 CT1 8-18 (SUTURE) ×2
SUT VIC AB 2-0 CT1 18 (SUTURE) ×4 IMPLANT
SUT VIC AB 3-0 SH 8-18 (SUTURE) ×4 IMPLANT
SYR 20ML ECCENTRIC (SYRINGE) ×2 IMPLANT
SYR 3ML LL SCALE MARK (SYRINGE) ×8 IMPLANT
SYR 5ML LL (SYRINGE) IMPLANT
TAPE CLOTH SURG 4X10 WHT LF (GAUZE/BANDAGES/DRESSINGS) ×2 IMPLANT
TOWEL OR 17X24 6PK STRL BLUE (TOWEL DISPOSABLE) ×2 IMPLANT
TOWEL OR 17X26 10 PK STRL BLUE (TOWEL DISPOSABLE) ×2 IMPLANT
TRAP SPECIMEN MUCOUS 40CC (MISCELLANEOUS) ×2 IMPLANT
TRAY FOLEY CATH 14FRSI W/METER (CATHETERS) ×2 IMPLANT
WATER STERILE IRR 1000ML POUR (IV SOLUTION) ×2 IMPLANT

## 2011-10-25 NOTE — Op Note (Signed)
10/25/2011  2:53 PM  PATIENT:  Jason Yu  61 y.o. male  PRE-OPERATIVE DIAGNOSIS:  lumbar stenosis post laminectomy syndrome lumbar radiculopathy, lumbar sponylosis  POST-OPERATIVE DIAGNOSIS:  lumbar stenosis, post laminectomy syndrome, lumbar radiculopathy, lumbar spondylosis  PROCEDURE:  Procedure(s) (LRB): POSTERIOR LUMBAR FUSION 2 LEVEL Decompression, PLIF L 3/4, L 4/5, pedicle screw fixation L 2- L 4, posterolateral arthrodesis L 2 - L 4 Bilaterally(N/A)  SURGEON:  Surgeon(s) and Role:    * Maeola Harman, MD - Primary  PHYSICIAN ASSISTANT: Elsner, MD  ASSISTANTS: Poteat, RN   ANESTHESIA:   general  EBL:  Total I/O In: 2600 [I.V.:2600] Out: 845 [Urine:295; Blood:550]  BLOOD ADMINISTERED:none  DRAINS: (Medium ) Hemovact drain(s) in the epidural space with  Suction Open   LOCAL MEDICATIONS USED:  LIDOCAINE   SPECIMEN:  No Specimen  DISPOSITION OF SPECIMEN:  N/A  COUNTS:  YES  TOURNIQUET:  * No tourniquets in log *  DICTATION: DICTATION: Patient is 61 year old man with  HNP, spondylosis, stenosis, DDD, radiculopathy L2-3 and L3-4. He has previously undergone lumbar laminectomy and fusion L 4 -S1 levels.He has a severe bilateral leg pain and weakness. It was elected to take him to surgery for decompression and fusion at LL2/3 and L 3/4 levels.    Procedure: Patient was placed in a prone position on the OR table after smooth and uncomplicated induction of general endotracheal anesthesia. His low back was prepped and draped in usual sterile fashion with DuraPrep. Area of incision was infiltrated with local lidocaine. Incision was made to the lumbodorsal fascia was incised and exposure was performed of the L2-4 spinous processes laminae facet joint and transverse processes. The rods below the previously placed L4 screws were cut and the L4 screws were removed. Intraoperative x-ray was obtained which confirmed correct orientation with markers at L2, L3, L4.  A total  laminectomy of L2 through L4  was performed with disarticulation of the facet joints at this level and thorough decompression was performed of both L2, L3 and L4 nerve roots along with the common dural tube. There was densely adherent spondylytic material compressing the thecal sac and both L3 and L4 nerve roots.  Decompression was more extensive than would be performed with typical PLIF, with total laminectomies and facetectomies at each level. A thorough discectomy and preparation of the endplates was performed at both the L2/3 and L3/4 levels.  The interspaces were packed with medium BMP,NexOss bone graft extender, and PEEK cages (9mm at L3/4 and 9mm at L2/3) . Bone autograft was packed within the interspace bilaterally along with medium BMP kit and NexOss bone graft extender. 8 cc of autograft was placed in the L2/3 interspace along with an 9mm PEEK PLIF spacer. After a thorough decompression with bilateral discectomy at L3/4, bilateral 9mm mm peek cages were packed with BMP and extender and was inserted the interspace and countersunk appropriately. An additional 8 cc of autograft was placed at this level as well. The posterolateral region was extensively decorticated and pedicle probes were placed at L2, L3 and L4 bilaterally. Intraoperative fluoroscopy confirmed correct orientationin the AP and lateral plane. 50 x 6.5 mm pedicle screws were placed at each level.  Final x-rays demonstrated well-positioned interbody grafts and pedicle screw fixation. 80 mm lordotic rods were placed and locked down in situ and the posterolateral region was packed with the remaining autograft and bone graft extender bilaterally. A medium Hemovac drain was placed in the epidural space. Fascia was closed with 1 Vicryl sutures  skin edges were reapproximated 2 and 3-0 Vicryl sutures. The wound is dressed with benzoin Steri-Strips Telfa gauze and tape the patient was extubated in the operating room and taken to recovery in stable  satisfactory condition having tolerated the operation well. Counts were correct at the end of the case.   PLAN OF CARE: Admit to inpatient   PATIENT DISPOSITION:  PACU - hemodynamically stable.   Delay start of Pharmacological VTE agent (>24hrs) due to surgical blood loss or risk of bleeding: yes

## 2011-10-25 NOTE — Anesthesia Preprocedure Evaluation (Addendum)
Anesthesia Evaluation  Patient identified by MRN, date of birth, ID band Patient awake    Reviewed: Allergy & Precautions, H&P , NPO status , Patient's Chart, lab work & pertinent test results  History of Anesthesia Complications (+) PONV  Airway Mallampati: I TM Distance: >3 FB Neck ROM: Limited    Dental  (+) Teeth Intact, Dental Advisory Given and Chipped   Pulmonary asthma ,  Asthma as a child PRN albuterol, has not needed for 8 months breath sounds clear to auscultation        Cardiovascular hypertension, Pt. on medications + dysrhythmias + pacemaker Rhythm:Regular Rate:Normal     Neuro/Psych    GI/Hepatic GERD-  ,(+) Hepatitis -  Endo/Other  negative endocrine ROS  Renal/GU negative Renal ROS  negative genitourinary   Musculoskeletal   Abdominal   Peds  Hematology negative hematology ROS (+)   Anesthesia Other Findings   Reproductive/Obstetrics                         Anesthesia Physical Anesthesia Plan  ASA: III  Anesthesia Plan: General   Post-op Pain Management:    Induction: Intravenous  Airway Management Planned: Oral ETT  Additional Equipment:   Intra-op Plan:   Post-operative Plan: Extubation in OR  Informed Consent: I have reviewed the patients History and Physical, chart, labs and discussed the procedure including the risks, benefits and alternatives for the proposed anesthesia with the patient or authorized representative who has indicated his/her understanding and acceptance.   Dental advisory given  Plan Discussed with: CRNA, Anesthesiologist and Surgeon  Anesthesia Plan Comments:         Anesthesia Quick Evaluation

## 2011-10-25 NOTE — H&P (Signed)
Jason Yu #[id number] DOB:  1951/01/20  Jason Yu returns today. He has had his pacemaker revised which necessitated his postponing his back surgery and he is doing well from that standpoint; however, he has been monitoring his blood pressure and it has been generally low.    At this point he wishes to go ahead with his back surgery as we had previously discussed, but he is going to speak with Dr. Andee Lineman, his cardiologist, as to whether or not he is safe to proceed with surgery.  Upon getting word on that, we will go ahead and set him up for the surgery, which had been previously scheduled for the 5th of February. I answered all his questions and addressed his concerns today.          Danae Orleans. Venetia Maxon, M.D./aft  cc: Dr. Ernestine Conrad   Jason Yu  #[id number] DOB:  [birth year] 07/22/2011:  Jason Yu returns today to review his myelogram and post-myelogram CT scan which shows that he has severe stenosis at L3-4 and moderately severe stenosis at L2-3.  He has had a solidly fused L4 through sacral levels without any complicating features at those levels.   Jason Yu says that he is having severe and intractable pain and that this is a big problem for him. He says that it migrates somewhat and that he has daily hip and leg pain, sometimes worse on the right, sometimes on the left.  His psoriasis on his back is better.    Based on my review of Jason Yu radiographs and his significant pain, I have recommended that we go ahead with surgical intervention and this would consist of decompression and fusion at the L2 through L4 levels with revision of previously placed hardware and exploration of previous fusion L4 through sacral levels.  We will plan on doing this on 08/06/2011.  He still has a back brace.  Risks and benefits were discussed in detail and he wishes to proceed.           Danae Orleans. Venetia Maxon, M.D./aft  cc: Dr. Ernestine Conrad     Remainder of H & P to be scanned to chart

## 2011-10-25 NOTE — Anesthesia Postprocedure Evaluation (Signed)
  Anesthesia Post-op Note  Patient: Jason Yu  Procedure(s) Performed: Procedure(s) (LRB): POSTERIOR LUMBAR FUSION 2 LEVEL (N/A)  Patient Location: PACU  Anesthesia Type: General  Level of Consciousness: awake and alert   Airway and Oxygen Therapy: Patient Spontanous Breathing and Patient connected to nasal cannula oxygen  Post-op Pain: mild  Post-op Assessment: Post-op Vital signs reviewed, Patient's Cardiovascular Status Stable, Respiratory Function Stable, Patent Airway, No signs of Nausea or vomiting and Pain level controlled  Post-op Vital Signs: Reviewed and stable  Complications: No apparent anesthesia complications

## 2011-10-25 NOTE — Transfer of Care (Signed)
Immediate Anesthesia Transfer of Care Note  Patient: Jason Yu  Procedure(s) Performed: Procedure(s) (LRB): POSTERIOR LUMBAR FUSION 2 LEVEL (N/A)  Patient Location: PACU  Anesthesia Type: General  Level of Consciousness: awake, alert  and oriented  Airway & Oxygen Therapy: Patient Spontanous Breathing and Patient connected to nasal cannula oxygen  Post-op Assessment: Report given to PACU RN, Post -op Vital signs reviewed and stable and Patient moving all extremities X 4  Post vital signs: Reviewed and stable  Complications: No apparent anesthesia complications

## 2011-10-25 NOTE — Preoperative (Signed)
Beta Blockers   Reason not to administer Beta Blockers:Not Applicable 

## 2011-10-25 NOTE — Interval H&P Note (Signed)
History and Physical Interval Note:  10/25/2011 10:15 AM  Jason Yu  has presented today for surgery, with the diagnosis of lumbar stenosis post laminectomy syndrome lumbar radiculopathy  The various methods of treatment have been discussed with the patient and family. After consideration of risks, benefits and other options for treatment, the patient has consented to  Procedure(s) (LRB): POSTERIOR LUMBAR FUSION 2 LEVEL (N/A) as a surgical intervention .  The patients' history has been reviewed, patient examined, no change in status, stable for surgery.  I have reviewed the patients' chart and labs.  Questions were answered to the patient's satisfaction.     [name] D  Date of Initial H&P: 10/25/2011  History reviewed, patient examined, no change in status, stable for surgery.

## 2011-10-25 NOTE — Progress Notes (Signed)
Pt.s pacer turned back on per [employer]

## 2011-10-26 MED ORDER — ALBUTEROL SULFATE (5 MG/ML) 0.5% IN NEBU: 2.5000 mg | INHALATION_SOLUTION | RESPIRATORY_TRACT | Status: DC | PRN

## 2011-10-26 NOTE — Plan of Care (Signed)
Patient states he does NOT want his foley removed today, he says he is not able to see how he could move to the bathroom today or sit high enough for a urinal. Patient stated understanding that the catheter should come out tomorrow morning if he refuses today.   Minor, Yvette Rack

## 2011-10-26 NOTE — Progress Notes (Signed)
Attempted to get patient OOB to chair this am and remove foley, however patient politely refused, stating he was hurting and did not sleep any last night. Will attempt again later on.  Will cont to monitor Minor, Jason Yu

## 2011-10-26 NOTE — Progress Notes (Signed)
CSW received consult for SNF. PT/OT recommendation for HH noted. No other CSW needs identified. CSW signing off. Please re-consult if SNF needed.  Unknown Schleyer, MSW, LCSWA [phone] (weekend) 

## 2011-10-26 NOTE — Progress Notes (Signed)
Subjective: Patient reports light headed after being up.  Ambulated with PT.  Objective: Vital signs in last 24 hours: Temp:  [97 F (36.1 C)-98.2 F (36.8 C)] 97.7 F (36.5 C) (04/27 0525) Pulse Rate:  [59-76] 63  (04/27 0525) Resp:  [10-35] 16  (04/27 0744) BP: (90-119)/(52-75) 92/53 mmHg (04/27 0525) SpO2:  [92 %-100 %] 99 % (04/27 0837) FiO2 (%):  [2 %] 2 % (04/26 1700)  Intake/Output from previous day: 04/26 0701 - 04/27 0700 In: 2900 [I.V.:2700] Out: 1345 [Urine:645; Drains:150; Blood:550] Intake/Output this shift:    Physical Exam: Legs full strength.  Has baseline numbness in feet.  Some dependent edema in hands.  Lab Results: No results found for this basename: WBC:2,HGB:2,HCT:2,PLT:2 in the last 72 hours BMET No results found for this basename: NA:2,K:2,CL:2,CO2:2,GLUCOSE:2,BUN:2,CREATININE:2,CALCIUM:2 in the last 72 hours  Studies/Results: Dg Lumbar Spine 2-3 Views  10/25/2011  *RADIOLOGY REPORT*  Clinical Data: Multilevel spinal stenosis and disc protrusions.  LUMBAR SPINE - 2-3 VIEW  Comparison: CT myelogram dated 07/05/2011  Findings: AP and lateral C-arm images demonstrate new pedicle screws inserted at L2 and L3 along with interbody fusion devices. Posterior rods have been removed at L4 on these images.  IMPRESSION: Hardware inserted for fusion at L2-3 and L3-4.  Original Report Authenticated By: Gwynn Burly, M.D.   Dg Lumbar Spine 1 View  10/25/2011  *RADIOLOGY REPORT*  Clinical Data: L2-3 and L3-4 PLIF  LUMBAR SPINE - 1 VIEW  Comparison: 07/05/2011  Findings: There has been previous discectomy and fusion from L4 to the sacrum.  Tissue spreaders are in place posteriorly with probes directed at the pedicle levels of L2 and L3.  IMPRESSION: Pedicle levels L2 and L3 localized.  Original Report Authenticated By: Thomasenia Sales, M.D.   Dg C-arm 1-60 Min  10/25/2011  *RADIOLOGY REPORT*  Clinical Data: Fusion.  C-ARM 1-60 MIN  Comparison: 10/25/2011 intraoperative  exam.  Findings: Two-view intraoperative views of the lumbar spine submitted for review after surgery.  As the lumbosacral junction is not completely included present exam, adequate level assignment is difficult.  Pedicle fusion and interbody spacers in place.  It appears there has been extension of remote fusion.  This can be evaluated on follow-up.  IMPRESSION: As the lumbosacral junction is not completely included present exam, adequate level assignment is difficult.  Pedicle fusion and interbody spacers in place.  It appears there has been extension of remote fusion.  This can be evaluated on follow-up.  Original Report Authenticated By: Fuller Canada, M.D.    Assessment/Plan: Mobilize with PT.  Change dressing. Continue PCA. Continue Hemovac today.    LOS: 1 day    Dorian Heckle, MD 10/26/2011, 10:23 AM

## 2011-10-26 NOTE — Evaluation (Signed)
Physical Therapy Evaluation Patient Details Name: Jason Yu MRN: [id number] DOB: 05-04-1951 Today's Date: 10/26/2011 Time: 7829-5621 PT Time Calculation (min): 46 min  PT Assessment / Plan / Recommendation Clinical Impression  61 year old s/p 2 level PLIF. Pt currently limited by decreased functional mobility, decreased recall of precautions, impaired balance, and decreased independence as compared to baseline. Pt will benefit from acute PT to address deficits. Pt lives alone and will have no assist at home at D/C, he is mobilizing well and feel he will be safe to D/C home at this time however need to make sure he is Modified independent prior to D/C.     PT Assessment  Patient needs continued PT services    Follow Up Recommendations  Home health PT    Equipment Recommendations  Rolling walker with 5" wheels;3 in 1 bedside comode    Frequency Min 5X/week    Precautions / Restrictions Precautions Precautions: Back Precaution Booklet Issued: Yes (comment) (handout provided) Required Braces or Orthoses: Spinal Brace Spinal Brace: Lumbar corset;Applied in sitting position Restrictions Weight Bearing Restrictions: No   Pertinent Vitals/Pain Pain 6/10 low back      Mobility  Bed Mobility Bed Mobility: Rolling Right;Right Sidelying to Sit Rolling Right: 5: Supervision Right Sidelying to Sit: 5: Supervision Details for Bed Mobility Assistance: Max encouragement for initiation. Max verbal cues for log roll, pt rolling his eyes at cues "I've been through this before." Tendency to twist Transfers Transfers: Sit to Stand;Stand to Sit Sit to Stand: 4: Min assist Stand to Sit: 4: Min guard Details for Transfer Assistance: Verbal cues for UE placement for safety, pt attempts to pull up on RW. Cues for decreased anxiety Ambulation/Gait Ambulation/Gait Assistance: 4: Min guard Ambulation Distance (Feet): 45 Feet (total with one seated rest) Assistive device: Rolling  walker Ambulation/Gait Assistance Details: Verbal cues for safe RW positioning and proximity. Pt with lightheadedness that required seated rest x 2.  Gait Pattern: Step-to pattern;Decreased step length - left;Decreased step length - right;Trunk flexed Stairs: No    Exercises General Exercises - Lower Extremity Toe Raises: AROM;Both;15 reps;Seated Heel Raises: AROM;Both;15 reps;Seated   PT Goals Acute Rehab PT Goals PT Goal Formulation: With patient Time For Goal Achievement: 11/02/11 Potential to Achieve Goals: Good Pt will Roll Supine to Right Side: Independently PT Goal: Rolling Supine to Right Side - Progress: Goal set today Pt will Roll Supine to Left Side: Independently PT Goal: Rolling Supine to Left Side - Progress: Goal set today Pt will go Supine/Side to Sit: Independently PT Goal: Supine/Side to Sit - Progress: Goal set today Pt will go Sit to Supine/Side: with modified independence PT Goal: Sit to Supine/Side - Progress: Goal set today Pt will go Sit to Stand: with modified independence PT Goal: Sit to Stand - Progress: Goal set today Pt will go Stand to Sit: with modified independence PT Goal: Stand to Sit - Progress: Goal set today Pt will Ambulate: >150 feet;with modified independence;with least restrictive assistive device PT Goal: Ambulate - Progress: Goal set today Pt will Go Up / Down Stairs: 1-2 stairs;with modified independence;with least restrictive assistive device;with rail(s) PT Goal: Up/Down Stairs - Progress: Goal set today Additional Goals Additional Goal #1: Patient will verablize 3/3 back precautions and demonstrate adherence throughout session. PT Goal: Additional Goal #1 - Progress: Goal set today  Visit Information  Last PT Received On: 10/26/11    Subjective Data  Subjective: I'm just really nauseated   Prior Functioning  Home Living Lives  With: Alone Type of Home: House Home Access: Stairs to enter [employer] of Steps:  1 Entrance Stairs-Rails: Can reach both Home Layout: One level Bathroom Shower/Tub: Engineer, manufacturing systems: Standard Bathroom Accessibility: Yes How Accessible: Accessible via walker Home Adaptive Equipment: Straight cane Prior Function Level of Independence: Independent Able to Take Stairs?: Yes Driving: Yes Vocation: On disability Comments: on disability about 8 years Communication Communication: No difficulties Dominant Hand: Right    Cognition  Overall Cognitive Status: Appears within functional limits for tasks assessed/performed Arousal/Alertness: Awake/alert Orientation Level: Oriented X4 / Intact Behavior During Session: Anxious    Extremity/Trunk Assessment Right Lower Extremity Assessment RLE ROM/Strength/Tone: Deficits RLE ROM/Strength/Tone Deficits: Generalized deconditioning, grossly >/= 3+/5 RLE Sensation: WFL - Light Touch Left Lower Extremity Assessment LLE ROM/Strength/Tone: Deficits LLE ROM/Strength/Tone Deficits: Generalized deconditioning, grossly >/= 3+/5 LLE Sensation: WFL - Light Touch   Balance Balance Balance Assessed: Yes Static Sitting Balance Static Sitting - Balance Support: Bilateral upper extremity supported;Feet supported Static Sitting - Level of Assistance: 6: Modified independent (Device/Increase time) Static Standing Balance Static Standing - Balance Support: Bilateral upper extremity supported Static Standing - Level of Assistance: 5: Stand by assistance  End of Session PT - End of Session Equipment Utilized During Treatment: Gait belt;Back brace Activity Tolerance: Patient limited by fatigue;Other (comment) (anxiety) Patient left: in chair;with call bell/phone within reach;with nursing in room Nurse Communication: Mobility status   Wilhemina Bonito 10/26/2011, 12:33 PM  Sherie Don) Carleene Mains PT, DPT Acute Rehabilitation [phone]

## 2011-10-26 NOTE — Evaluation (Signed)
Occupational Therapy Evaluation Patient Details Name: Jason Yu MRN: [id number] DOB: 1950-07-02 Today's Date: 10/26/2011 Time: 1020-1030 OT Time Calculation (min): 10 min  OT Assessment / Plan / Recommendation Clinical Impression  Pt. presents s/p PLIF and with increased pain. Pt. will benefit from skilled OT to increase functional independence with ADLs and get pt. to supervision level at D/C    OT Assessment  Patient needs continued OT Services    Follow Up Recommendations  Home health OT;Supervision - Intermittent    Equipment Recommendations  Rolling walker with 5" wheels;3 in 1 bedside comode  Tub DME (TBA)   Frequency Min 2X/week    Precautions / Restrictions Precautions Precautions: Back Precaution Booklet Issued: Yes (comment) Required Braces or Orthoses: Spinal Brace Spinal Brace: Lumbar corset;Applied in sitting position Restrictions Weight Bearing Restrictions: No   Pertinent Vitals/Pain 9/10 back    ADL  Eating/Feeding: Simulated;Independent Where Assessed - Eating/Feeding: Chair Grooming: Performed;Wash/dry face;Set up Where Assessed - Grooming: Supported sitting Upper Body Bathing: Simulated;Chest;Right arm;Left arm;Abdomen;Set up Where Assessed - Upper Body Bathing: Sitting, chair Lower Body Bathing: Simulated;Maximal assistance Where Assessed - Lower Body Bathing: Sit to stand from chair Upper Body Dressing: Simulated;Set up Where Assessed - Upper Body Dressing: Sitting, chair Lower Body Dressing: Simulated;Maximal assistance Where Assessed - Lower Body Dressing: Sit to stand from chair Toilet Transfer: Simulated;Minimal assistance Toilet Transfer Method: Stand pivot Toilet Transfer Equipment: Other (comment) (straight back chair) Toileting - Clothing Manipulation: Simulated;Minimal assistance Where Assessed - Toileting Clothing Manipulation: Sit to stand from 3-in-1 or toilet Toileting - Hygiene: Simulated;Minimal assistance Where Assessed -  Toileting Hygiene: Sit to stand from 3-in-1 or toilet Tub/Shower Transfer: Not assessed Tub/Shower Transfer Method: Not assessed Equipment Used: Back brace;Rolling walker Ambulation Related to ADLs: NA ADL Comments: Pt. educated on 3/3 back precautions for completing ADLs and pt. able to verbalize no bending however unable to state the other precautions. Pt. demonstrated ability to doff brace with min assist and mod verbal cues throughout for encouragement and to calm pt. due to increased anxiety about needing to get back to bed.     OT Goals Acute Rehab OT Goals OT Goal Formulation: With patient Time For Goal Achievement: 11/02/11 Potential to Achieve Goals: Good ADL Goals Pt Will Perform Grooming: with set-up;with supervision;Standing at sink ADL Goal: Grooming - Progress: Goal set today Pt Will Perform Lower Body Bathing: with set-up;with supervision;Sit to stand from chair;with adaptive equipment ADL Goal: Lower Body Bathing - Progress: Goal set today Pt Will Perform Lower Body Dressing: with set-up;with supervision;with adaptive equipment;Sit to stand from chair ADL Goal: Lower Body Dressing - Progress: Goal set today Pt Will Transfer to Toilet: with supervision;with DME;3-in-1;Maintaining back safety precautions ADL Goal: Toilet Transfer - Progress: Goal set today Pt Will Perform Tub/Shower Transfer: Tub transfer;with supervision;with DME;Ambulation;Transfer tub bench ADL Goal: Tub/Shower Transfer - Progress: Goal set today Additional ADL Goal #1: Pt. will recall 3/3 back precautions with ADLs ADL Goal: Additional Goal #1 - Progress: Goal set today  Visit Information  Last OT Received On: 10/26/11 Assistance Needed: +1    Subjective Data  Subjective: "I wanna get back to bed" Patient Stated Goal: "Go home"   Prior Functioning  Home Living Lives With: Alone Type of Home: House Home Access: Stairs to enter [employer] of Steps: 1 Entrance Stairs-Rails: Can reach  both Home Layout: One level Bathroom Shower/Tub: Engineer, manufacturing systems: Standard Bathroom Accessibility: Yes How Accessible: Accessible via walker Home Adaptive Equipment: Straight cane Prior Function  Level of Independence: Independent Able to Take Stairs?: Yes Driving: Yes Vocation: On disability Comments: Has been on disability for 8 years Communication Communication: No difficulties Dominant Hand: Right    Cognition  Overall Cognitive Status: Appears within functional limits for tasks assessed/performed Arousal/Alertness: Awake/alert Orientation Level: Oriented X4 / Intact Behavior During Session: Anxious    Extremity/Trunk Assessment Right Lower Extremity Assessment RLE ROM/Strength/Tone: Deficits RLE ROM/Strength/Tone Deficits: Generalized deconditioning, grossly >/= 3+/5 RLE Sensation: WFL - Light Touch Left Lower Extremity Assessment LLE ROM/Strength/Tone: Deficits LLE ROM/Strength/Tone Deficits: Generalized deconditioning, grossly >/= 3+/5 LLE Sensation: WFL - Light Touch   Mobility Bed Mobility Bed Mobility: Sit to Sidelying Right Rolling Right: 5: Supervision Right Sidelying to Sit: 5: Supervision Sit to Sidelying Right: 5: Supervision;With rail Details for Bed Mobility Assistance: Mod verbal cues for no twisting and for safe transfer for log rolling Transfers Transfers: Sit to Stand;Stand to Sit Sit to Stand: 4: Min assist Stand to Sit: 4: Min guard Details for Transfer Assistance: Verbal cues for UE placement for safety, pt attempts to pull up on RW. Cues for decreased anxiety         End of Session OT - End of Session Equipment Utilized During Treatment: Gait belt Activity Tolerance: Patient limited by pain Patient left: in bed;with call bell/phone within reach;with bed alarm set Nurse Communication: Mobility status   Jason Yu, OTR/L  Pager [phone] 10/26/2011, 12:57 PM

## 2011-10-27 MED ORDER — PANTOPRAZOLE SODIUM 40 MG PO TBEC
40.0000 mg | DELAYED_RELEASE_TABLET | Freq: Every day | ORAL | Status: DC
  Administered 2011-10-28 – 2011-10-30 (×3): 40 mg via ORAL
  Filled 2011-10-27: qty 2
  Filled 2011-10-27 (×3): qty 1

## 2011-10-27 MED ORDER — HYDROMORPHONE HCL 2 MG PO TABS
2.0000 mg | ORAL_TABLET | ORAL | Status: DC | PRN
  Administered 2011-10-27: 2 mg via ORAL
  Administered 2011-10-27 – 2011-10-30 (×19): 4 mg via ORAL
  Filled 2011-10-27 (×10): qty 2
  Filled 2011-10-27: qty 1
  Filled 2011-10-27 (×2): qty 2
  Filled 2011-10-27: qty 1
  Filled 2011-10-27: qty 2
  Filled 2011-10-27: qty 1
  Filled 2011-10-27 (×5): qty 2

## 2011-10-27 NOTE — Progress Notes (Signed)
Patient ID: Jason Yu, male   DOB: [birth year], 61 y.o.   MRN: [id number] Vital signs stable. Patient complains of significant pain. Lites PCA pump.  Motor function appears intact in lower extremities. Dressing with drain still in place.  Discussed the seen PCA pump with patient Will place on oral hydromorphone as he states he does not tolerate Vicodin. Hep well IV. Encouraged ambulation

## 2011-10-27 NOTE — Progress Notes (Signed)
Physical Therapy Treatment Patient Details Name: TRAVANTI MCMANUS MRN: [id number] DOB: 1951-07-01 Today's Date: 10/27/2011 Time: 4098-1191 PT Time Calculation (min): 23 min  PT Assessment / Plan / Recommendation Comments on Treatment Session  61 year old s/p 2 level PLIF. Pt making great progress today, much less anxious and able to ambulate 300' with RW and supervision. Would like to try steps next session, overall doing very well.     Follow Up Recommendations  Home health PT    Equipment Recommendations  Rolling walker with 5" wheels;3 in 1 bedside comode    Frequency Min 5X/week   Plan Discharge plan remains appropriate    Precautions / Restrictions Precautions Precautions: Back Precaution Booklet Issued: Yes (comment) Required Braces or Orthoses: Spinal Brace Spinal Brace: Lumbar corset;Applied in sitting position Restrictions Weight Bearing Restrictions: No   Pertinent Vitals/Pain 5/10 back pain    Mobility  Bed Mobility Bed Mobility: Rolling Right;Right Sidelying to Sit Rolling Right: 6: Modified independent (Device/Increase time) Right Sidelying to Sit: 5: Supervision Details for Bed Mobility Assistance: Supervision for safety, verbal cues for maintaining precautions.  Transfers Transfers: Sit to Stand;Stand to Sit Sit to Stand: 5: Supervision Stand to Sit: 5: Supervision Details for Transfer Assistance: Verbal cues for UE placement for safety, no physical assist today. Continues to try to pull up on RW. Ambulation/Gait Ambulation/Gait Assistance: 5: Supervision Ambulation Distance (Feet): 300 Feet Assistive device: Rolling walker Ambulation/Gait Assistance Details: Verbal cues for safe negotiation of RW, upright posture.  Gait Pattern: Step-to pattern;Decreased step length - left;Decreased step length - right;Trunk flexed Stairs: No        PT Goals Acute Rehab PT Goals Time For Goal Achievement: 11/02/11 Pt will Roll Supine to Right Side: Independently PT  Goal: Rolling Supine to Right Side - Progress: Progressing toward goal Pt will go Supine/Side to Sit: Independently PT Goal: Supine/Side to Sit - Progress: Progressing toward goal Pt will go Sit to Stand: with modified independence PT Goal: Sit to Stand - Progress: Progressing toward goal Pt will go Stand to Sit: with modified independence PT Goal: Stand to Sit - Progress: Progressing toward goal Pt will Ambulate: >150 feet;with modified independence;with least restrictive assistive device PT Goal: Ambulate - Progress: Progressing toward goal Additional Goals Additional Goal #1: Patient will verablize 3/3 back precautions and demonstrate adherence throughout session. PT Goal: Additional Goal #1 - Progress: Progressing toward goal  Visit Information  Last PT Received On: 10/27/11 Assistance Needed: +1             End of Session PT - End of Session Equipment Utilized During Treatment: Gait belt;Back brace Activity Tolerance: Patient tolerated treatment well Patient left: in chair;with call bell/phone within reach Nurse Communication: Mobility status    Wilhemina Bonito 10/27/2011, 2:08 PM  Sherie Don) Carleene Mains PT, DPT Acute Rehabilitation [phone]

## 2011-10-28 NOTE — Progress Notes (Signed)
Subjective: Patient reports improving, but still not independent.  Objective: Vital signs in last 24 hours: Temp:  [98 F (36.7 C)-99.7 F (37.6 C)] 98.5 F (36.9 C) (04/29 0528) Pulse Rate:  [76-89] 83  (04/29 0528) Resp:  [18] 18  (04/29 0528) BP: (112-151)/(65-91) 150/91 mmHg (04/29 0528) SpO2:  [90 %-98 %] 90 % (04/29 0528)  Intake/Output from previous day: 04/28 0701 - 04/29 0700 In: 480 [P.O.:480] Out: 1670 [Urine:1550; Drains:120] Intake/Output this shift:    Physical Exam: Full strength both legs.  Dressing CDI  Lab Results: No results found for this basename: WBC:2,HGB:2,HCT:2,PLT:2 in the last 72 hours BMET No results found for this basename: NA:2,K:2,CL:2,CO2:2,GLUCOSE:2,BUN:2,CREATININE:2,CALCIUM:2 in the last 72 hours  Studies/Results: No results found.  Assessment/Plan: Discontinue drain.  Continue to mobilize with PT. Increase pain meds.    LOS: 3 days    Dorian Heckle, MD 10/28/2011, 7:40 AM

## 2011-10-28 NOTE — Progress Notes (Signed)
Pts hemovac and suture removed. Pt tolerated well. Dressing changed. Incision closure steri strips. Well approximated and no drainage.

## 2011-10-28 NOTE — Progress Notes (Signed)
CARE MANAGEMENT NOTE 10/28/2011  Patient:  Jason Yu, Jason Yu   Account Number:  [id number]  Date Initiated:  10/28/2011  Documentation initiated by:  Vance Peper  Subjective/Objective Assessment:     61 yr old male s/p: POSTERIOR LUMBAR FUSION 2 LEVEL Decompression, PLIF L 3/4, L 4/5, pedicle screw fixation L 2- L 4, posterolateral arthrodesis L 2 - L 4 Bilaterally(N/A)         Action/Plan:   Spoke with patient regarding HH needs. Patient states he is walking without assistance. Has medicaid will not qualify for [facility] PT/OT. Home Health RN will evaluate patient. Choice offered.   Anticipated DC Date:  10/29/2011   Anticipated DC Plan:  HOME W HOME HEALTH SERVICES      DC Planning Services  CM consult      [facility] Choice  HOME HEALTH  DURABLE MEDICAL EQUIPMENT   Choice offered to / List presented to:  C-1 Patient   DME arranged  3-N-1      DME agency  [facility]     [facility] arranged  HH-1 RN      [facility] agency  [facility]   Status of service:  Completed, signed off Medicare Important Message given?   (If response is "NO", the following Medicare IM given date fields will be blank) Date Medicare IM given:   Date Additional Medicare IM given:    Discharge Disposition:  HOME W HOME HEALTH SERVICES  Per UR Regulation:    If discussed at Long Length of Stay Meetings, dates discussed:    Comments:

## 2011-10-28 NOTE — Discharge Instructions (Signed)
Home Health to be provided by [facility] [phone] 

## 2011-10-28 NOTE — Progress Notes (Signed)
Occupational Therapy Treatment Patient Details Name: HAZE ANTILLON MRN: [id number] DOB: Jul 31, 1950 Today's Date: 10/28/2011 Time: 4098-1191 OT Time Calculation (min): 26 min  OT Assessment / Plan / Recommendation Comments on Treatment Session Pt doing much better today.  Demonstrates good understanding of back precautions and safety with BADLs.      Follow Up Recommendations  Home health OT;Supervision - Intermittent    Equipment Recommendations  3 in 1 bedside comode    Frequency Min 2X/week   Plan Discharge plan remains appropriate    Precautions / Restrictions Precautions Precautions: Back Precaution Booklet Issued: Yes (comment) Precaution Comments: Pt able to independently state back precautions and demonstrates good awareness Required Braces or Orthoses: Spinal Brace Spinal Brace: Lumbar corset;Applied in sitting position Restrictions Weight Bearing Restrictions: No   Pertinent Vitals/Pain     ADL  Eating/Feeding: Performed;Independent Where Assessed - Eating/Feeding: Chair Lower Body Bathing: Simulated;Supervision/safety Where Assessed - Lower Body Bathing: Standing in shower (with LH sponge) Upper Body Dressing: Simulated;Set up Where Assessed - Upper Body Dressing: Sitting, bed;Sitting, chair Lower Body Dressing: Performed;Supervision/safety Where Assessed - Lower Body Dressing: Sit to stand from chair;Sit to stand from bed Toilet Transfer: Simulated;Supervision/safety Toilet Transfer Method: Proofreader: Comfort height toilet Toileting - Clothing Manipulation: Simulated;Modified independent Where Assessed - Toileting Clothing Manipulation: Standing Toileting - Hygiene: Simulated;Modified independent Where Assessed - Toileting Hygiene: Sit on 3-in-1 or toilet Tub/Shower Transfer: Performed;Supervision/safety Tub/Shower Transfer Method: Science writer:  (tub) Equipment Used: Back brace Ambulation Related to  ADLs: supervision without AD ADL Comments: Pt. demonstrates good understanding of back precautions.  Able to don/doff socks with ankles crossed over knees.  Discussed safe technique for household management.  Pt. is independent with donning/doffing brace.  Pt. was provided with reacher from indigent supply    OT Goals ADL Goals ADL Goal: Lower Body Bathing - Progress: Met ADL Goal: Lower Body Dressing - Progress: Met ADL Goal: Toilet Transfer - Progress: Met ADL Goal: Tub/Shower Transfer - Progress: Met ADL Goal: Additional Goal #1 - Progress: Met  Visit Information  Last OT Received On: 10/28/11 Assistance Needed: +1    Subjective Data      Prior Functioning       Cognition  Overall Cognitive Status: Appears within functional limits for tasks assessed/performed Arousal/Alertness: Awake/alert Orientation Level: Oriented X4 / Intact Behavior During Session: [facility] for tasks performed    Mobility Bed Mobility Bed Mobility: Rolling Right;Right Sidelying to Sit Rolling Right: 6: Modified independent (Device/Increase time);With rail Right Sidelying to Sit: 6: Modified independent (Device/Increase time);With rails;HOB elevated Details for Bed Mobility Assistance: relied on bed to help him get to upright sitting Transfers Transfers: Sit to Stand;Stand to Sit Sit to Stand: 6: Modified independent (Device/Increase time) Stand to Sit: 6: Modified independent (Device/Increase time) Details for Transfer Assistance: patient performed with correct and safet technique.     Exercises    Balance Balance Balance Assessed: Yes Static Sitting Balance Static Sitting - Balance Support: No upper extremity supported Static Sitting - Level of Assistance: 6: Modified independent (Device/Increase time) Static Standing Balance Static Standing - Balance Support: No upper extremity supported Static Standing - Level of Assistance: 6: Modified independent (Device/Increase time) Dynamic Standing  Balance Dynamic Standing - Level of Assistance: 5: Stand by assistance  End of Session OT - End of Session Activity Tolerance: Patient tolerated treatment well Patient left: in chair;with call bell/phone within reach Nurse Communication:  (pain level)   Athene Schuhmacher, Ursula Alert M 10/28/2011, 12:16 PM

## 2011-10-28 NOTE — Progress Notes (Signed)
Physical Therapy Treatment Patient Details Name: Jason Yu MRN: [id number] DOB: [birth year] Today's Date: 10/28/2011 Time: 4098-1191 PT Time Calculation (min): 29 min  PT Assessment / Plan / Recommendation Comments on Treatment Session  61 y.o. male s/p 2 level PLIF is continuing to progress with his mobility and has now successfully completed the stairs.      Follow Up Recommendations  No PT follow up    Equipment Recommendations  3 in 1 bedside comode    Frequency Min 5X/week   Plan Discharge plan needs to be updated;Frequency remains appropriate    Precautions / Restrictions Precautions Precautions: Back Precaution Booklet Issued: Yes (comment) Precaution Comments: reviewed back precautions and lifting restrictions.  Already has handout.   Required Braces or Orthoses: Spinal Brace Spinal Brace: Lumbar corset;Applied in sitting position (patient demonstrated independence doning brace.  )   Pertinent Vitals/Pain 3/10 low back incisional pain.    Mobility  Bed Mobility Rolling Right: 6: Modified independent (Device/Increase time);With rail Right Sidelying to Sit: 6: Modified independent (Device/Increase time);With rails;HOB elevated Details for Bed Mobility Assistance: relied on bed to help him get to upright sitting  Transfers Sit to Stand: 6: Modified independent (Device/Increase time);With upper extremity assist;From bed Stand to Sit: 6: Modified independent (Device/Increase time);With upper extremity assist;To chair/3-in-1;With armrests Details for Transfer Assistance: patient performed with correct and safet technique.    Ambulation/Gait Ambulation/Gait Assistance: 5: Supervision Ambulation Distance (Feet): 350 Feet Assistive device: Rolling walker;None Ambulation/Gait Assistance Details: 200' with RW, 150' without RW still supervision for safety, gait speed decreased without assistive device.   Gait Pattern: Step-through pattern  Stairs: Yes Stairs  Assistance: 6: Modified independent (Device/Increase time) Stair Management Technique: Two rails;Alternating pattern;Forwards Number of Stairs: 10      PT Goals Acute Rehab PT Goals Time For Goal Achievement: 11/02/11 PT Goal: Rolling Supine to Right Side - Progress: Progressing toward goal PT Goal: Supine/Side to Sit - Progress: Progressing toward goal PT Goal: Sit to Stand - Progress: Met PT Goal: Stand to Sit - Progress: Met PT Goal: Ambulate - Progress: Progressing toward goal PT Goal: Up/Down Stairs - Progress: Met Additional Goals PT Goal: Additional Goal #1 - Progress: Progressing toward goal  Visit Information  Last PT Received On: 10/28/11 Assistance Needed: +1    Subjective Data  Subjective: The patient reports his pain is much more manageable today.     Cognition  Overall Cognitive Status: Appears within functional limits for tasks assessed/performed Arousal/Alertness: Awake/alert Behavior During Session: [facility] for tasks performed    End of Session PT - End of Session Equipment Utilized During Treatment: Back brace Activity Tolerance: Patient limited by pain Patient left: in chair;with call bell/phone within reach    Springs B. Jasn Xia, PT, DPT [phone] 10/28/2011, 11:05 AM

## 2011-10-29 MED FILL — Sodium Chloride Irrigation Soln 0.9%: Qty: 3000 | Status: AC

## 2011-10-29 MED FILL — Heparin Sodium (Porcine) Inj 1000 Unit/ML: INTRAMUSCULAR | Qty: 30 | Status: AC

## 2011-10-29 MED FILL — Sodium Chloride IV Soln 0.9%: INTRAVENOUS | Qty: 1000 | Status: AC

## 2011-10-29 NOTE — Progress Notes (Signed)
Physical Therapy Treatment Patient Details Name: Jason Yu MRN: [id number] DOB: 1951/06/09 Today's Date: 10/29/2011 Time:  -     PT Assessment / Plan / Recommendation Comments on Treatment Session  Pt was able to amb without RW but used to back brace for support. Pt had no LOB or unsteadiess during amb.     Follow Up Recommendations  No PT follow up    Equipment Recommendations  3 in 1 bedside comode    Frequency Min 5X/week   Plan Discharge plan remains appropriate;Frequency remains appropriate    Precautions / Restrictions Precautions Precautions: Back;Fall Precaution Comments: pt recall/ demo 3/3 back precaustions during treatment  Required Braces or Orthoses: Spinal Brace Spinal Brace: Applied in supine position;Lumbar corset Restrictions Weight Bearing Restrictions: No   Pertinent Vitals/Pain     Mobility  Bed Mobility Bed Mobility: Rolling Right;Right Sidelying to Sit;Sitting - Scoot to Edge of Bed Rolling Right: 7: Independent Right Sidelying to Sit: 7: Independent Sitting - Scoot to Edge of Bed: 7: Independent Details for Bed Mobility Assistance: Pt able to get OOB with bed flat with any assistance or VC's  Transfers Transfers: Sit to Stand;Stand to Sit Sit to Stand: 6: Modified independent (Device/Increase time);With upper extremity assist;From bed Stand to Sit: 6: Modified independent (Device/Increase time);To bed (EOB) Ambulation/Gait Ambulation/Gait Assistance: 5: Supervision Ambulation Distance (Feet): 150 Feet Assistive device: None Ambulation/Gait Assistance Details: PT able to amb without RW with no pain, LOB or unsteadiness. Gait Pattern: Step-through pattern Stairs Assistance: 6: Modified independent (Device/Increase time) Stair Management Technique: Two rails;Alternating pattern;Forwards Number of Stairs: 5  (in gym 3rd floor) Wheelchair Mobility Wheelchair Mobility: No    Exercises     PT Goals Acute Rehab PT Goals PT Goal: Rolling  Supine to Right Side - Progress: Met PT Goal: Rolling Supine to Left Side - Progress: Met PT Goal: Sit to Stand - Progress: Met PT Goal: Ambulate - Progress: Met PT Goal: Up/Down Stairs - Progress: Met Additional Goals PT Goal: Additional Goal #1 - Progress: Met  Visit Information  Last PT Received On: 10/29/11    Subjective Data  Subjective: pt has increased pain due to over doing it yesterday    Cognition  Overall Cognitive Status: Appears within functional limits for tasks assessed/performed Arousal/Alertness: Awake/alert Orientation Level: Appears intact for tasks assessed Behavior During Session: [facility] for tasks performed    Balance     End of Session PT - End of Session Equipment Utilized During Treatment: Back brace Activity Tolerance: Patient tolerated treatment well Patient left: in bed (EOB) Nurse Communication: Mobility status    Tamera Stands 10/29/2011, 12:59 PM

## 2011-10-29 NOTE — Progress Notes (Signed)
CSW received consult for SNF. PT is recommending home health services. RNCM is aware and has arranged home health services. CSW is signing off as no further clinical social work discharge needs identified. Please reconsult if a need arises prior to discharge.   Dede Query, MSW, Theresia Majors [phone]

## 2011-10-29 NOTE — Progress Notes (Signed)
UR COMPLETED  

## 2011-10-29 NOTE — Progress Notes (Signed)
Subjective: Patient reports "I feel pretty good; I just can't seem to stay on schedule with my pain medicine."   Objective: Vital signs in last 24 hours: Temp:  [98.5 F (36.9 C)-99.5 F (37.5 C)] 98.8 F (37.1 C) (04/30 0600) Pulse Rate:  [72-88] 88  (04/30 0600) Resp:  [18] 18  (04/30 0600) BP: (98-132)/(61-78) 124/77 mmHg (04/30 0600) SpO2:  [94 %-97 %] 95 % (04/30 0600)  Intake/Output from previous day: 04/29 0701 - 04/30 0700 In: 480 [P.O.:480] Out: 90 [Drains:90] Intake/Output this shift:    Alert, conversant. Ambulating, slow with transfers/position changes d/t pain. Incision without erythema, swelling, drainage. Good strength BLE.  Lab Results: No results found for this basename: WBC:2,HGB:2,HCT:2,PLT:2 in the last 72 hours BMET No results found for this basename: NA:2,K:2,CL:2,CO2:2,GLUCOSE:2,BUN:2,CREATININE:2,CALCIUM:2 in the last 72 hours  Studies/Results: No results found.  Assessment/Plan: Improving  LOS: 4 days  Pt concerned with level of independence living alone. Dr. Venetia Maxon will d/w pt plans for discharge home.   Georgiann Cocker 10/29/2011, 9:32 AM

## 2011-10-29 NOTE — Progress Notes (Signed)
10/29/2011 Nyla Creason Elizabeth PTA [phone] pager [phone] office    

## 2011-10-30 NOTE — Discharge Summary (Signed)
Physician Discharge Summary  Patient ID: Jason Yu MRN: [id number] DOB/AGE: 11-01-1950 61 y.o.  Admit date: 10/25/2011 Discharge date: 10/30/2011  Admission Diagnoses:Lumbar spinal stenosis and spondylosis with HNP  Discharge Diagnoses: Same Active Problems:  * No active hospital problems. *    Discharged Condition: good  Hospital Course: Uncomplicated decompression and fusion L2-L4 levels  Consults: None  Significant Diagnostic Studies: None  Treatments: surgery: Uncomplicated decompression and fusion L2-L4 levels   Discharge Exam: Blood pressure 144/86, pulse 85, temperature 99 F (37.2 C), temperature source Oral, resp. rate 18, height 5\' 5"  (1.651 m), weight 76.658 kg (169 lb), SpO2 96.00%. Neurologic: Alert and oriented X 3, normal strength and tone. Normal symmetric reflexes. Normal coordination and gait Wound:CDI  Disposition: Home  Discharge Orders    Future Appointments: Provider: Department: Dept Phone: Center:   11/20/2011 11:45 AM Hillis Range, MD [facility] [phone] [facility]     Medication List  As of 10/30/2011  8:11 AM   TAKE these medications         albuterol (2.5 MG/3ML) 0.083% nebulizer solution   Commonly known as: PROVENTIL   Take 2.5 mg by nebulization every 6 (six) hours as needed. For shortness of breath      amLODipine 10 MG tablet   Commonly known as: NORVASC   Take 5 mg by mouth daily.      atorvastatin 20 MG tablet   Commonly known as: LIPITOR   Take 20 mg by mouth at bedtime.      diazepam 10 MG tablet   Commonly known as: VALIUM   Take 10 mg by mouth every 8 (eight) hours as needed. For anxiety      Fish Oil 1000 MG Caps   Take 3 capsules by mouth daily.      Flax Seed Oil 1000 MG Caps   Take 1 capsule by mouth daily. 1300 mg      Glucosamine HCl 1000 MG Tabs   Take 2 tablets by mouth daily.      HYDROcodone-acetaminophen 10-325 MG per tablet   Commonly known as: NORCO   Take 1 tablet by mouth every 8  (eight) hours as needed. For pain      ketoconazole 2 % cream   Commonly known as: NIZORAL   Apply 1 application topically daily. For mustache rash      metoprolol succinate 100 MG 24 hr tablet   Commonly known as: TOPROL-XL   Take 50 mg by mouth every morning.      multivitamin tablet   Take 1 tablet by mouth daily.      omeprazole 20 MG capsule   Commonly known as: PRILOSEC   Take 20 mg by mouth daily.      PARoxetine 10 MG tablet   Commonly known as: PAXIL   Take 10 mg by mouth daily.      potassium chloride 10 MEQ tablet   Commonly known as: K-DUR,KLOR-CON   Take 10 mEq by mouth 2 (two) times daily.      vitamin C 1000 MG tablet   Take 1,000 mg by mouth daily.             Signed: Dorian Heckle, MD 10/30/2011, 8:11 AM

## 2011-10-30 NOTE — Progress Notes (Signed)
Subjective: Patient reports doing well  Objective: Vital signs in last 24 hours: Temp:  [98.2 F (36.8 C)-99.9 F (37.7 C)] 99 F (37.2 C) (05/01 0600) Pulse Rate:  [63-87] 85  (05/01 0600) Resp:  [18] 18  (05/01 0600) BP: (95-144)/(62-86) 144/86 mmHg (05/01 0600) SpO2:  [94 %-96 %] 96 % (05/01 0600)  Intake/Output from previous day:   Intake/Output this shift:    Physical Exam: Full strength, dressing CDI  Lab Results: No results found for this basename: WBC:2,HGB:2,HCT:2,PLT:2 in the last 72 hours BMET No results found for this basename: NA:2,K:2,CL:2,CO2:2,GLUCOSE:2,BUN:2,CREATININE:2,CALCIUM:2 in the last 72 hours  Studies/Results: No results found.  Assessment/Plan: Doing well.  DC home with po dilaudid and flexeril.    LOS: 5 days    Dorian Heckle, MD 10/30/2011, 7:56 AM

## 2011-11-20 ENCOUNTER — Encounter: Payer: Medicaid Other | Admitting: Internal Medicine

## 2011-12-04 ENCOUNTER — Encounter: Payer: Self-pay | Admitting: Internal Medicine

## 2011-12-04 ENCOUNTER — Ambulatory Visit (INDEPENDENT_AMBULATORY_CARE_PROVIDER_SITE_OTHER): Payer: Medicaid Other | Admitting: Internal Medicine

## 2011-12-04 VITALS — BP 161/96 | HR 90 | Ht 65.0 in | Wt 168.0 lb

## 2011-12-04 DIAGNOSIS — I10 Essential (primary) hypertension: Secondary | ICD-10-CM

## 2011-12-04 DIAGNOSIS — I442 Atrioventricular block, complete: Secondary | ICD-10-CM

## 2011-12-04 LAB — PACEMAKER DEVICE OBSERVATION
AL IMPEDENCE PM: 416 Ohm
ATRIAL PACING PM: 3
RV LEAD IMPEDENCE PM: 548 Ohm
VENTRICULAR PACING PM: 100

## 2011-12-04 NOTE — Patient Instructions (Signed)
Follow up with Dr. Allred in February 2014. Your physician recommends that you continue on your current medications as directed. Please refer to the Current Medication list given to you today.  

## 2011-12-04 NOTE — Assessment & Plan Note (Signed)
Elevated today, though he reports good BP control at home No changes today He will contact our office if his home BP readings become elevated.

## 2011-12-04 NOTE — Assessment & Plan Note (Signed)
Normal pacemaker function See Pace Art report No changes today  

## 2011-12-04 NOTE — Progress Notes (Signed)
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  Since his recent pacemaker generator change 2/15, the patient reports doing very well.  He underwent subsequent back surgery 4/13 and continues to recover from this. Today, he denies symptoms of palpitations, chest pain, shortness of breath,  lower extremity edema, dizziness, presyncope, or syncope.  The patient is otherwise without complaint today.   Past Medical History  Diagnosis Date  . Complete heart block     with prior syncope s/p PPM 2004 in [place] (MDT Kappa);  08/16/2011 PPM Gen. Change: MDT Adapta L ADDDR1, [id number] H.  . HTN (hypertension), benign   . Syncope     a.  2001 - normal cath;  b. 2012 Normal Myoview, EF 58%.  Marland Kitchen Anxiety   . HLD (hyperlipidemia)   . DDD (degenerative disc disease)     with multiple prior back surgeries   . Complication of anesthesia 05/2011    had difficulty breathing after surgery=C8 nerve root compression  . Asthma     hasn't used inhaler at least 4-5 months.  . Shortness of breath     Hx of during childhood-asthma.  . Pneumonia last 1998    has had 8x  . Hepatitis     Hx Hepatitis A & B  . GERD (gastroesophageal reflux disease)    Past Surgical History  Procedure Date  . Cervical neck surgeries   . Pacemaker insertion     Implanted 2004 in [place]  . Cardiac catheterization 2001    no significant CAD  . Insert / replace / remove pacemaker 08/2011    initially inserted 12/2002  . Back surgery 08/2009  . Shoulder arthroscopy distal clavicle excision and open rotator cuff repair 2009    right  . Shoulder arthroscopy distal clavicle excision and open rotator cuff repair 2010    Left  . Carpal tunnel release 1979  . Ulnar tunnel release 1980's(late) or early 1990's  . Pacemaker generator change 08/16/11    Medtronic Adapta L implanted by Dr Johney Frame, lead implangted 2004 in [place]     Current Outpatient Prescriptions  Medication Sig Dispense Refill  . albuterol (PROVENTIL) (2.5 MG/3ML) 0.083% nebulizer solution Take 2.5 mg by nebulization every 6 (six) hours as needed. For shortness of breath      . amLODipine (NORVASC) 10 MG tablet Take 5 mg by mouth daily.       . Ascorbic Acid (VITAMIN C) 1000 MG tablet Take 1,000 mg by mouth daily.        Marland Kitchen atorvastatin (LIPITOR) 20 MG tablet Take 20 mg by mouth at bedtime.      . diazepam (VALIUM) 10 MG tablet Take 10 mg by mouth every 8 (eight) hours as needed. For anxiety      . Flaxseed, Linseed, (FLAX SEED OIL) 1000 MG CAPS Take 1 capsule by mouth daily. 1300 mg      . Glucosamine HCl 1000 MG TABS Take 2 tablets by mouth daily.        Marland Kitchen HYDROcodone-acetaminophen (NORCO) 10-325 MG per tablet Take 1 tablet by mouth every 8 (eight) hours as needed. For pain      . ketoconazole (NIZORAL) 2 % cream Apply 1 application topically daily. For mustache rash      . metoprolol (TOPROL-XL) 100 MG 24 hr tablet Take 50 mg by mouth every morning. Takes only when BP is high Per Patient      .  Multiple Vitamin (MULTIVITAMIN) tablet Take 1 tablet by mouth daily.        . Omega-3 Fatty Acids (FISH OIL) 1000 MG CAPS Take 3 capsules by mouth daily.        Marland Kitchen omeprazole (PRILOSEC) 20 MG capsule Take 20 mg by mouth daily.        Marland Kitchen PARoxetine (PAXIL) 10 MG tablet Take 10 mg by mouth daily.       . potassium chloride (K-DUR,KLOR-CON) 10 MEQ tablet Take 10 mEq by mouth 2 (two) times daily.        Physical Exam: Filed Vitals:   12/04/11 1003  BP: 161/96  Pulse: 90  Height: 5\' 5"  (1.651 m)  Weight: 168 lb (76.204 kg)    GEN- The patient is well appearing, alert and oriented x 3 today.   Head- normocephalic, atraumatic Eyes-  Sclera clear, conjunctiva pink Ears- hearing intact Oropharynx- clear Lungs- Clear to ausculation bilaterally, normal work of breathing Chest- pacemaker pocket is well healed Heart- Regular rate and rhythm, no murmurs,  rubs or gallops, PMI not laterally displaced GI- soft, NT, ND, + BS Extremities- no clubbing, cyanosis, or edema MS- wearing a back brace today and walking slowly  Pacemaker interrogation- reviewed in detail today,  See PACEART report  Assessment and Plan:

## 2012-01-22 ENCOUNTER — Other Ambulatory Visit: Payer: Self-pay

## 2012-02-06 ENCOUNTER — Ambulatory Visit (INDEPENDENT_AMBULATORY_CARE_PROVIDER_SITE_OTHER): Payer: Medicaid Other | Admitting: Cardiology

## 2012-02-06 ENCOUNTER — Encounter: Payer: Self-pay | Admitting: Cardiology

## 2012-02-06 VITALS — BP 190/101 | HR 73 | Ht 65.0 in | Wt 179.0 lb

## 2012-02-06 DIAGNOSIS — Z950 Presence of cardiac pacemaker: Secondary | ICD-10-CM

## 2012-02-06 DIAGNOSIS — Z01810 Encounter for preprocedural cardiovascular examination: Secondary | ICD-10-CM

## 2012-02-06 DIAGNOSIS — I10 Essential (primary) hypertension: Secondary | ICD-10-CM

## 2012-02-06 DIAGNOSIS — M549 Dorsalgia, unspecified: Secondary | ICD-10-CM | POA: Insufficient documentation

## 2012-02-06 MED ORDER — LABETALOL HCL 100 MG PO TABS: 100.0000 mg | ORAL_TABLET | Freq: Two times a day (BID) | ORAL | Status: DC

## 2012-02-06 MED ORDER — AMLODIPINE BESYLATE 10 MG PO TABS: 10.0000 mg | ORAL_TABLET | Freq: Every day | ORAL | Status: DC

## 2012-02-06 NOTE — Progress Notes (Signed)
Jason Bottoms, MD, [facility] ABIM Board Certified in Adult Cardiovascular Medicine,Internal Medicine and Critical Care Medicine    CC:  Followup patient with a history of complete AV block status post pacemaker changeouts recently for end of life . Preoperative clearance prior to back surgery                                                                               HPI:    Patient is a 61 year old male with no history of coronary artery disease status post pacemaker implantation for syncope and heart block. The patient has undergone earlier this year back surgery but apparently he needs to have this redone due to slippage of one of his discs. The patient is wearing a brace and has persistent back pain  . He denies any chest pain shortness of breath orthopnea PND and fortunately his blood pressures very poorly controlled with systolic readings at home greater than 190 mm of mercury systolic. The patient just started recently his amlodipine which was not continued after his recent surgery. He also just started taking his half dose of metoprolol. His blood pressure in the office today is 190/101 mm of mercury. The patient denies any presyncope syncope or palpitations.  PMH: reviewed and listed in Problem List in Electronic Records (and see below) Past Medical History  Diagnosis Date  . Complete heart block     with prior syncope s/p PPM 2004 in [place] (MDT Kappa);  08/16/2011 PPM Gen. Change: MDT Adapta L ADDDR1, [id number] H.  . HTN (hypertension), benign   . Syncope     a.  2001 - normal cath;  b. 2012 Normal Myoview, EF 58%.  Marland Kitchen Anxiety   . HLD (hyperlipidemia)   . DDD (degenerative disc disease)     with multiple prior back surgeries   . Complication of anesthesia 05/2011    had difficulty breathing after surgery=C8 nerve root compression  . Asthma     hasn't used inhaler at least 4-5 months.  . Shortness of breath     Hx of during childhood-asthma.  . Pneumonia last 1998    has had 8x   . Hepatitis     Hx Hepatitis A & B  . GERD (gastroesophageal reflux disease)    Past Surgical History  Procedure Date  . Cervical neck surgeries   . Pacemaker insertion     Implanted 2004 in [place]  . Cardiac catheterization 2001    no significant CAD  . Insert / replace / remove pacemaker 08/2011    initially inserted 12/2002  . Back surgery 08/2009  . Shoulder arthroscopy distal clavicle excision and open rotator cuff repair 2009    right  . Shoulder arthroscopy distal clavicle excision and open rotator cuff repair 2010    Left  . Carpal tunnel release 1979  . Ulnar tunnel release 1980's(late) or early 1990's  . Pacemaker generator change 08/16/11    Medtronic Adapta L implanted by Dr Johney Frame, lead implangted 2004 in [place]    Allergies/SH/FHX : available in Electronic Records for review  Allergies  Allergen Reactions  . Other Other (See Comments)    Nuclear Stress Medicine- made him feel" out of touch"  w/ reality.  . Prednisone Other (See Comments)    REACTION: pain in joints  . Codeine Rash    REACTION: rash,itch   History   Social History  . Marital Status: Single    Spouse Name: N/A    Number of Children: N/A  . Years of Education: N/A   Occupational History  . Not on file.   Social History Main Topics  . Smoking status: Former Smoker -- 2.0 packs/day for 3 years    Types: Cigarettes    Quit date: 11/29/1977  . Smokeless tobacco: Never Used  . Alcohol Use: No  . Drug Use: Yes     smokes occasional marijuana, no ready to quit  . Sexually Active: Not on file   Other Topics Concern  . Not on file   Social History Narrative   Lives alone   Family History  Problem Relation Age of Onset  . Diabetes Neg Hx   . Hypertension Neg Hx   . Coronary artery disease Neg Hx     Medications: Current Outpatient Prescriptions  Medication Sig Dispense Refill  . albuterol (PROVENTIL) (2.5 MG/3ML) 0.083% nebulizer solution Take 2.5 mg by nebulization every 6 (six)  hours as needed. For shortness of breath      . amLODipine (NORVASC) 10 MG tablet Take 5 mg by mouth daily.       . Ascorbic Acid (VITAMIN C) 1000 MG tablet Take 1,000 mg by mouth daily.        Marland Kitchen atorvastatin (LIPITOR) 20 MG tablet Take 20 mg by mouth at bedtime.      . diazepam (VALIUM) 10 MG tablet Take 10 mg by mouth every 8 (eight) hours as needed. For anxiety      . Flaxseed, Linseed, (FLAX SEED OIL) 1000 MG CAPS Take 1 capsule by mouth daily. 1300 mg      . Glucosamine HCl 1000 MG TABS Take 2 tablets by mouth daily.        Marland Kitchen HYDROcodone-acetaminophen (NORCO) 10-325 MG per tablet Take 1 tablet by mouth every 8 (eight) hours as needed. For pain      . ketoconazole (NIZORAL) 2 % cream Apply 1 application topically daily. For mustache rash      . metoprolol (TOPROL-XL) 100 MG 24 hr tablet Take 50 mg by mouth every morning. Takes only when BP is high Per Patient      . Multiple Vitamin (MULTIVITAMIN) tablet Take 1 tablet by mouth daily.        . Omega-3 Fatty Acids (FISH OIL) 1000 MG CAPS Take 3 capsules by mouth daily.        Marland Kitchen omeprazole (PRILOSEC) 20 MG capsule Take 20 mg by mouth daily.        Marland Kitchen PARoxetine (PAXIL) 10 MG tablet Take 10 mg by mouth daily.       . potassium chloride (K-DUR,KLOR-CON) 10 MEQ tablet Take 10 mEq by mouth 2 (two) times daily.        ROS: No nausea or vomiting. No fever or chills.No melena or hematochezia.No bleeding.No claudication  Physical Exam: BP 190/101  Pulse 73  Ht 5\' 5"  (1.651 m)  Wt 179 lb (81.194 kg)  BMI 29.79 kg/m2 General: Well-nourished white male complaining of back pain and wearing a brace Neck: Normal carotid upstroke no carotid bruits. No thyromegaly nonnodular thyroid. JVP is 6 cm Lungs: Clear breath sounds bilaterally no wheezing. Cardiac: Regular rate and rhythm with a paradoxically split second heart sound no pathological murmurs. Vascular:  No edema. Normal distal pulses Skin: Warm and dry Physcologic: Normal affect.  12lead  ECG: Limited bedside ECHO:N/A No images are attached to the encounter.   I reviewed and summarized the old records. I reviewed ECG and prior blood work.  Assessment and Plan  Chest pain No recurrent chest pain. No angina symptoms. Continue current medical regimen  HYPERTENSION, BENIGN Blood pressure is very high the patient was not giving his usual medications after hospital discharge from his last back surgery. He just started retaking his amlodipine and has been taking half of his usual dose of metoprolol. I asked the patient to take amlodipine 10 mg down daily basis we'll also change him from metoprolol to labetalol 100 mg by mouth twice a day. The patient will e-mail me next week and blood pressures elevated/remains elevated we will increase labetalol to 200 mg by mouth twice a day and further increased as needed for blood pressure.  Preoperative cardiovascular examination Patient is scheduled again for back surgery in the next couple of months. Apparently had slippage of his fusion. The patient can safely undergo surgery from a cardiovascular perspective. A magnet should be applied for pacemaker cauterization as needed. No further risk stratification with stress testing is indicated  Back pain Patient had multiple back surgeries. He needs a reoperation. He is cleared from a cardiovascular perspective  PACEMAKER, PERMANENT No complications with pacemaker followed by the EP clinic successful changeouts earlier this year    Patient Active Problem List  Diagnosis  . HYPERTRIGLYCERIDEMIA  . HYPERTENSION, BENIGN  . PACEMAKER, PERMANENT  . Dyslipidemia  . Preoperative cardiovascular examination  . Complete heart block  . Chest pain  . Back pain

## 2012-02-06 NOTE — Assessment & Plan Note (Signed)
No complications with pacemaker followed by the EP clinic successful changeouts earlier this year

## 2012-02-06 NOTE — Assessment & Plan Note (Signed)
Patient is scheduled again for back surgery in the next couple of months. Apparently had slippage of his fusion. The patient can safely undergo surgery from a cardiovascular perspective. A magnet should be applied for pacemaker cauterization as needed. No further risk stratification with stress testing is indicated

## 2012-02-06 NOTE — Assessment & Plan Note (Signed)
Patient had multiple back surgeries. He needs a reoperation. He is cleared from a cardiovascular perspective

## 2012-02-06 NOTE — Assessment & Plan Note (Signed)
No recurrent chest pain. No angina symptoms. Continue current medical regimen

## 2012-02-06 NOTE — Patient Instructions (Addendum)
   Change Amlodipine to 10mg  daily  Stop Metoprolol  Change to Labetalol 100mg  twice a day    Patient to e-mail blood pressure readings  Your physician wants you to follow up in: 6 months.  You will receive a reminder letter in the mail one-two months in advance.  If you don't receive a letter, please call our office to schedule the follow up appointment

## 2012-02-06 NOTE — Assessment & Plan Note (Signed)
Blood pressure is very high the patient was not giving his usual medications after hospital discharge from his last back surgery. He just started retaking his amlodipine and has been taking half of his usual dose of metoprolol. I asked the patient to take amlodipine 10 mg down daily basis we'll also change him from metoprolol to labetalol 100 mg by mouth twice a day. The patient will e-mail me next week and blood pressures elevated/remains elevated we will increase labetalol to 200 mg by mouth twice a day and further increased as needed for blood pressure.

## 2012-02-10 ENCOUNTER — Other Ambulatory Visit: Payer: Self-pay | Admitting: Neurosurgery

## 2012-02-13 ENCOUNTER — Encounter (HOSPITAL_COMMUNITY): Payer: Self-pay | Admitting: Pharmacy Technician

## 2012-02-21 ENCOUNTER — Encounter (HOSPITAL_COMMUNITY): Payer: Self-pay

## 2012-02-21 ENCOUNTER — Encounter (HOSPITAL_COMMUNITY)
Admission: RE | Admit: 2012-02-21 | Discharge: 2012-02-21 | Disposition: A | Discharge status: Home / Self Care | Payer: Medicaid Other | Source: Ambulatory Visit | Attending: Neurosurgery | Admitting: Neurosurgery

## 2012-02-21 HISTORY — DX: Nausea with vomiting, unspecified: R11.2

## 2012-02-21 HISTORY — DX: Other specified postprocedural states: Z98.890

## 2012-02-21 LAB — SURGICAL PCR SCREEN
MRSA, PCR: POSITIVE — AB
Staphylococcus aureus: POSITIVE — AB

## 2012-02-21 LAB — TYPE AND SCREEN: ABO/RH(D): O POS

## 2012-02-21 LAB — COMPREHENSIVE METABOLIC PANEL
BUN: 13 mg/dL (ref 6–23)
CO2: 25 mEq/L (ref 19–32)
Calcium: 9.7 mg/dL (ref 8.4–10.5)
Creatinine, Ser: 0.75 mg/dL (ref 0.50–1.35)
GFR calc Af Amer: >90 mL/min (ref >90)
GFR calc non Af Amer: >90 mL/min (ref >90)
Glucose, Bld: 110 mg/dL — ABNORMAL HIGH (ref 70–99)
Total Protein: 7.7 g/dL (ref 6.0–8.3)

## 2012-02-21 LAB — CBC
HCT: 42.9 % (ref 39.0–52.0)
Hemoglobin: 14.8 g/dL (ref 13.0–17.0)
WBC: 6.4 10*3/uL (ref 4.0–10.5)

## 2012-02-21 NOTE — Pre-Procedure Instructions (Signed)
[address]  02/21/2012   Your procedure is scheduled on:  Thursday August 29  Report to [facility] at 9:00 AM.  Call this number if you have problems the morning of surgery: [phone]   Remember:   Do not eat or drink:After Midnight.    Take these medicines the morning of surgery with A SIP OF WATER: Amlodipine (Norvasc), Labetalol (Normodyne), Prilosec (omeprazole), Paxil (paroxetine). Diazepam (Valium), Norco (pain pill) if needed. Albuterol inhaler if needed, bring to surgery.  Stop taking aspirin, Coumadin, Plavix, Effient, and herbal medications today.    Do not wear jewelry, make-up or nail polish.  Do not wear lotions, powders, or perfumes. You may wear deodorant.  Do not shave 48 hours prior to surgery. Men may shave face and neck.  Do not bring valuables to the hospital.  Contacts, dentures or bridgework may not be worn into surgery.  Leave suitcase in the car. After surgery it may be brought to your room.  For patients admitted to the hospital, checkout time is 11:00 AM the day of discharge.   Patients discharged the day of surgery will not be allowed to drive home.  Name and phone number of your driver: NA  Special Instructions: CHG Shower Use Special Wash: 1/2 bottle night before surgery and 1/2 bottle morning of surgery.   Please read over the following fact sheets that you were given: Pain Booklet, Coughing and Deep Breathing and Surgical Site Infection Prevention

## 2012-02-21 NOTE — Progress Notes (Signed)
Pt states he does not want to have the ulnar nerve surgery, he will call Dr. Fredrich Birks office to discuss. Will sign consent DOS.

## 2012-02-24 NOTE — Progress Notes (Signed)
Spoke with Lela at [facility]- have not received so I refaxed to clinic-received confirmation fax received. Placed back in follow up cabinet.

## 2012-02-26 MED ORDER — CEFAZOLIN SODIUM-DEXTROSE 2-3 GM-% IV SOLR
2.0000 g | INTRAVENOUS | Status: AC
  Administered 2012-02-27: 2 g via INTRAVENOUS
  Filled 2012-02-26: qty 50

## 2012-02-26 NOTE — Progress Notes (Signed)
NOTIFIED MARCIA WITH [employer] OF PATIENT SURGERY 02/27/12.

## 2012-02-26 NOTE — H&P (Signed)
Jason Yu  #[id number] DOB:  07-08-1950 02/10/2012:     Jason Yu comes in today to review his postoperative CT scan.  This indeed shows that he has some retropulsion of the interbody graft at the L2-3 level particularly on the right.  The interbody material is well positioned at L3-4.  The screws are well positioned.  He is having a lot of right leg pain and I believe it relates to this migrated implant and I think that this needs to be revised.    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  He is concerned that he may have a C8 radiculopathy as well, and indeed there is also evidence of chronic neurogenic motor unit changes and significant motor unit dropout in the C8 myotomal distribution.  It was felt to potentially relate to a significant C8 radiculopathy which also could be contributing to his hand intrinsic weakness.  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    At this point, I have advised Jason Yu to go ahead with revision of the L2-3 fusion with a right ulnar nerve decompression at the elbow.  We will see how he does with that.  If he does not make progress with that, we may consider doing a foraminotomy at the C7-T1 level on the right but I would not recommend doing all of these things at the same time.    I will plan on doing this in the relatively near future.           Danae Orleans. Venetia Maxon, M.D./gde  Jason Yu  [phone] DOB:  Apr 01, 1951 01/01/2012:     Jason Yu returns today.  He says he is having a harder time recovering from his surgery than he did from previous occasions.  He also is noticing some pain into his left leg.    I got some x-rays today which show that the cages at L3-4 appear stable but one of the cages at L2-3 has migrated  posteriorly and appears to be somewhat in the spinal canal.  This is concerning with his pain complaints.    From the time of surgery to now, there is clearly evidence of migration of one of the cages.  The other cages appear stable.    I am quite concerned about this and this might explain his persistent symptoms with jarring back pain as well as left leg pain.  I do not know which side this cage is on.  We will have to get a CT scan and to assess whether there is material in the spinal canal.  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  This is based on reproducible finding of ulnar, motor nerve conduction velocity slowing and amplitude velocity stimulation across the elbow. Pathophysiology includes motor and axonal loss in addition to motor demyelination.  There is no evidence of a concomitant ulnar neuropathy more distally at the Guyon canal.  In addition, there is also evidence of chronic neurogenic motor unit changes and significant motor unit dropout in a C8 myotomal pattern.  This involves not only C8 muscles in an ulnar distribution but also in the radial distribution as well.  This  would be consistent with a significant C8 radiculopathy and could be contributing to his hand intrinsic weakness as well.  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  I also reviewed his previous EMG which was done by Dr. Wynn Banker 11/25/2007 which shows chronic denervation of left C8 distribution. His hand intrinsic weakness appears to be persistent and bilateral.  He has a markedly positive Tinel's sign at the right elbow.    I suggested that Jason Yu get a CT scan of his lumbar spine so that I can assess for migration of the cage.  This will need to be repaired  surgically.  At the same time, I told him that we could take care of his ulnar neuropathy since this is an ongoing problem for him on the right.  I will see him back after CT of his lumbar spine.          Danae Orleans. Venetia Maxon, M.D Jason Yu [phone] DOB:  07-08-1950 08/28/2011:  Jason Yu returns today. He has had his pacemaker revised which necessitated his postponing his back surgery and he is doing well from that standpoint; however, he has been monitoring his blood pressure and it has been generally low.    At this point he wishes to go ahead with his back surgery as we had previously discussed, but he is going to speak with Dr. Andee Lineman, his cardiologist, as to whether or not he is safe to proceed with surgery.  Upon getting word on that, we will go ahead and set him up for the surgery, which had been previously scheduled for the 5th of February. I answered all his questions and addressed his concerns today.          Danae Orleans. Venetia Maxon, M.D./aft NEUROSURGICAL CONSULTATION  Jason Yu  #[id number] DOB:  [birth year]  Nov 04, 2007  HISTORY OF PRESENT ILLNESS:  Jason Yu is a 61 year old disabled right-handed man who presents at the request of Dr. Loney Hering for neurosurgical consultation with the chief complaint of arm, neck, and shoulder pain.  Currently this ranges from 7-10/10 in severity with dull aches in both hands, low back pain 5-9/10 and leg pain 8/10 with strenuous activities.  He notes weakness in his arms and hands and says this has been going on for years.  Currently he takes Ibuprofen 600 mg. q.i.d., Tramadol t.i.d., Diazepam 10 mg. 2-3 times daily.  He has had multiple surgeries on his neck performed at the [facility], [place].  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  He notes 8 previous neck surgeries including hand surgery in 1999 on the left hand and left  ulnar nerve and also right wrist.  I have about an inch thick of discharge summaries and care summaries from his  hospitalizations.  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  He has had some problems with chest pain.  Additionally, he has had neck surgery documented by post myelographic CT scan which demonstrates significant cervical disease with previous corpectomy and anteriorly placed cages.  He has nerve conduction velocities which were obtained dating during his care period but there does not appear to be a date on the study, however they demonstrated left C7 radiculopathy, residual left ulnar nerve compression.  He has  records from neurosurgery from 06/13/99 which discusses cervical stenosis and problems with increased pain and weakness in his left arm.  He has had, based on my review of his cervical myelogram, C4-7 fusion with anteriorly placed cages.  He currently complains of persistent arm weakness.  REVIEW OF SYSTEMS:   A detailed Review of Systems sheet was reviewed with the patient.  Pertinent positives include night sweats, wears glasses, ringing in ears, chest pain, high blood pressure, irregular pulse, high cholesterol, leg pain while walking, asthma, shortness of breath, arm weakness, leg weakness, back pain, arm pain, leg pain, joint pain or swelling, arthritis, neck pain, anxiety, depression, increased appetite, and inhalant nasal allergies.  All other systems are negative; this includes  nose, mouth, throat, Gastrointestinal, Genitourinary, Integumentary, Neurologic, and Hematologic/Lymphatic.    PAST MEDICAL HISTORY:      Current Medical Conditions:    As above.      Prior Operations and Hospitalizations:   As above.      Jason Yu  #[id number] DOB:  [birth year]   Nov 04, 2007 Page 2    Medications and Allergies:  He is allergic to codeine, prednisone, and nicotinic acid.  Current medications include Ibuprofen, Omeprazole, Simvastatin, WelChol, Felodipine, Diovan, Metoprolol, Tramadol and Diazepam.      Height and Weight:      183 pounds.  5'5" tall.   FAMILY HISTORY:    Parents are deceased.      SOCIAL HISTORY:    He is a nonsmoker and nondrinker.  No history of substance abuse.  He notes weight gain.  DIAGNOSTIC STUDIES:   As above.  PHYSICAL EXAMINATION:      General Appearance:   Jason Yu is an uncomfortable appearing man in no acute distress.      Blood Pressure, Pulse:     150/90.  Heart rate 78 and regular.    HEENT - normocephalic, atraumatic.  The pupils are equal, round and reactive to light.  The extraocular muscles are intact.  Sclerae - white.  Conjunctiva - pink.  Oropharynx benign.  Uvula midline.     Neck - there is a healed anterior cervical incision.  He has limited range of motion of his cervical spine in flexion, extension, lateral bending, and rotation.  There are no masses, meningismus, deformities, tracheal deviation, jugular vein distention or carotid bruits.  Spurlings' test is negative without reproducible radicular pain turning the patient's head to either side.  Lhermitte's sign is not present with axial compression.      Respiratory - there is normal respiratory effort with good intercostal function.  Lungs are clear to auscultation.  There are no rales, rhonchi or wheezes.      Cardiovascular - the heart has regular rate and rhythm to auscultation.  No murmurs are appreciated.  There is no extremity edema, cyanosis or clubbing.  There are palpable pedal pulses.      Abdomen - soft, nontender, no hepatosplenomegaly appreciated or masses.  There are active bowel sounds.  No guarding or rebound.      Musculoskeletal Examination - he has healed left ulnar nerve incisions, bilateral carpal tunnel incisions.  Jason Yu  #[id number] DOB:  [birth year]   Nov 04, 2007 Page 3  NEUROLOGICAL EXAMINATION: The patient is oriented to time, person and place and has good recall of both recent and remote memory with normal attention span and concentration.  The patient speaks with clear and  fluent speech and exhibits normal language function and appropriate fund  of knowledge.      Cranial Nerve Examination - pupils are equal, round and reactive to light.  Extraocular movements are full.  Visual fields are full to confrontational testing.  Facial sensation and facial movement are symmetric and intact.  Hearing is intact to finger rub.  Palate is upgoing.  Shoulder shrug is symmetric.  Tongue protrudes in the midline.      Motor Examination - motor strength is 5/5 in the bilateral deltoids, biceps, triceps, handgrips, wrist extensors, interosseous.  In the lower extremities motor strength is 5/5 in hip flexion, extension, quadriceps, hamstrings, plantar flexion, dorsiflexion and extensor hallucis longus with the exception of bilateral hand intrinsic and finger extensor 4/5 on the right and 4-/5 on the left.  There is mild atrophy on the right hand first dorsal interosseous and severe atrophy on the left hand.      Sensory Examination - he has numbness along the ulnar aspect of both arms, worse on the left.      Deep Tendon Reflexes - 3 in the biceps, triceps, and brachioradialis, 3 in the knees, 3 in the ankles.  The great toes are downgoing to plantar stimulation.  No pathologic reflexes.       Cerebellar Examination - normal coordination in upper and lower extremities and normal rapid alternating movements.  Romberg test is negative.    IMPRESSION AND RECOMMENDATIONS: Jason Yu is a 61 year old man who has had 8 previous neck operations.  He has a healed C4-7 fusion.  He has spurring bilateral spurring identified at C7-T1 on myelogram and post myelographic CT scan without obvious instability.  He has chronic enervative changes in both his upper extremities.  He wants to know if anything can be done. I told him that I was not optimistic that further surgery would likely be of any benefit to him.  I told him it would be useful to get EMG, nerve conduction velocities to establish whether  there is any ongoing radicular nerve problems or if this is all chronic.  We will set this up with Dr. Wynn Banker in our office.  I will make further recommendations after these studies have been done.  I appreciate your kind referral.  [facility]  Danae Orleans. Venetia Maxon, M.D.

## 2012-02-26 NOTE — Progress Notes (Addendum)
REFAXED ICD FORM TO [facility] . LEFT MESSAGE TO FAX BACK ASAP, SURGERY 02/27/12.

## 2012-02-27 ENCOUNTER — Encounter (HOSPITAL_COMMUNITY): Payer: Self-pay | Admitting: *Deleted

## 2012-02-27 ENCOUNTER — Encounter (HOSPITAL_COMMUNITY): Payer: Self-pay | Admitting: Certified Registered"

## 2012-02-27 ENCOUNTER — Encounter (HOSPITAL_COMMUNITY)
Admission: RE | Disposition: A | Discharge status: Home Health | Payer: Self-pay | Source: Ambulatory Visit | Attending: Neurosurgery

## 2012-02-27 ENCOUNTER — Inpatient Hospital Stay (HOSPITAL_COMMUNITY)
Admission: RE | Admit: 2012-02-27 | Discharge: 2012-03-02 | DRG: 460 | Disposition: A | Discharge status: Home Health | Payer: Medicaid Other | Source: Ambulatory Visit | Attending: Neurosurgery | Admitting: Neurosurgery

## 2012-02-27 ENCOUNTER — Ambulatory Visit (HOSPITAL_COMMUNITY): Payer: Medicaid Other | Admitting: Certified Registered"

## 2012-02-27 ENCOUNTER — Ambulatory Visit (HOSPITAL_COMMUNITY): Payer: Medicaid Other

## 2012-02-27 DIAGNOSIS — Y832 Surgical operation with anastomosis, bypass or graft as the cause of abnormal reaction of the patient, or of later complication, without mention of misadventure at the time of the procedure: Secondary | ICD-10-CM | POA: Diagnosis present

## 2012-02-27 DIAGNOSIS — Y92009 Unspecified place in unspecified non-institutional (private) residence as the place of occurrence of the external cause: Secondary | ICD-10-CM

## 2012-02-27 DIAGNOSIS — T84498A Other mechanical complication of other internal orthopedic devices, implants and grafts, initial encounter: Principal | ICD-10-CM | POA: Diagnosis present

## 2012-02-27 DIAGNOSIS — G5620 Lesion of ulnar nerve, unspecified upper limb: Secondary | ICD-10-CM | POA: Diagnosis present

## 2012-02-27 DIAGNOSIS — M48061 Spinal stenosis, lumbar region without neurogenic claudication: Secondary | ICD-10-CM | POA: Diagnosis present

## 2012-02-27 DIAGNOSIS — J45909 Unspecified asthma, uncomplicated: Secondary | ICD-10-CM | POA: Diagnosis present

## 2012-02-27 SURGERY — POSTERIOR LUMBAR FUSION 1 LEVEL
Anesthesia: General | Site: Elbow | Laterality: Right | Wound class: Clean

## 2012-02-27 MED ORDER — LACTATED RINGERS IV SOLN
INTRAVENOUS | Status: DC | PRN
  Administered 2012-02-27 (×2): via INTRAVENOUS

## 2012-02-27 MED ORDER — ONDANSETRON HCL 4 MG/2ML IJ SOLN: 4.0000 mg | Freq: Four times a day (QID) | INTRAMUSCULAR | Status: DC | PRN

## 2012-02-27 MED ORDER — POLYETHYLENE GLYCOL 3350 17 G PO PACK
17.0000 g | PACK | Freq: Every day | ORAL | Status: DC | PRN
  Administered 2012-03-02: 17 g via ORAL
  Filled 2012-02-27: qty 1

## 2012-02-27 MED ORDER — HYDROMORPHONE HCL PF 1 MG/ML IJ SOLN
0.2500 mg | INTRAMUSCULAR | Status: DC | PRN
  Administered 2012-02-27 (×4): 0.5 mg via INTRAVENOUS

## 2012-02-27 MED ORDER — HYDROMORPHONE HCL PF 1 MG/ML IJ SOLN
INTRAMUSCULAR | Status: AC
  Administered 2012-02-27: 0.5 mg via INTRAVENOUS
  Filled 2012-02-27: qty 1

## 2012-02-27 MED ORDER — POTASSIUM CHLORIDE CRYS ER 10 MEQ PO TBCR
10.0000 meq | EXTENDED_RELEASE_TABLET | Freq: Two times a day (BID) | ORAL | Status: DC
  Administered 2012-02-27 – 2012-03-02 (×8): 10 meq via ORAL
  Filled 2012-02-27 (×9): qty 1

## 2012-02-27 MED ORDER — VITAMIN C 500 MG PO TABS
1000.0000 mg | ORAL_TABLET | Freq: Every day | ORAL | Status: DC
  Administered 2012-02-27 – 2012-03-02 (×5): 1000 mg via ORAL
  Filled 2012-02-27 (×5): qty 2

## 2012-02-27 MED ORDER — ACETAMINOPHEN 325 MG PO TABS: 650.0000 mg | ORAL_TABLET | ORAL | Status: DC | PRN

## 2012-02-27 MED ORDER — HYDROCODONE-ACETAMINOPHEN 5-325 MG PO TABS: 1.0000 | ORAL_TABLET | ORAL | Status: DC | PRN

## 2012-02-27 MED ORDER — KCL IN DEXTROSE-NACL 20-5-0.45 MEQ/L-%-% IV SOLN
INTRAVENOUS | Status: DC
  Administered 2012-02-27 – 2012-02-29 (×3): via INTRAVENOUS
  Filled 2012-02-27 (×8): qty 1000

## 2012-02-27 MED ORDER — HYDROMORPHONE 0.3 MG/ML IV SOLN
INTRAVENOUS | Status: AC
  Filled 2012-02-27: qty 25

## 2012-02-27 MED ORDER — SENNA 8.6 MG PO TABS
1.0000 | ORAL_TABLET | Freq: Two times a day (BID) | ORAL | Status: DC
  Administered 2012-02-27 – 2012-03-02 (×8): 8.6 mg via ORAL
  Filled 2012-02-27 (×11): qty 1

## 2012-02-27 MED ORDER — TERBINAFINE HCL 125 MG PO PACK: 1.0000 | PACK | Freq: Every day | ORAL | Status: DC

## 2012-02-27 MED ORDER — ROCURONIUM BROMIDE 100 MG/10ML IV SOLN
INTRAVENOUS | Status: DC | PRN
  Administered 2012-02-27: 50 mg via INTRAVENOUS

## 2012-02-27 MED ORDER — PHENOL 1.4 % MT LIQD: 1.0000 | OROMUCOSAL | Status: DC | PRN

## 2012-02-27 MED ORDER — THROMBIN 20000 UNITS EX SOLR
CUTANEOUS | Status: DC | PRN
  Administered 2012-02-27: 15:00:00 via TOPICAL

## 2012-02-27 MED ORDER — PROMETHAZINE HCL 25 MG/ML IJ SOLN
6.2500 mg | INTRAMUSCULAR | Status: DC | PRN
  Administered 2012-02-27: 6.25 mg via INTRAVENOUS

## 2012-02-27 MED ORDER — VANCOMYCIN HCL IN DEXTROSE 1-5 GM/200ML-% IV SOLN
INTRAVENOUS | Status: AC
  Filled 2012-02-27: qty 200

## 2012-02-27 MED ORDER — ATORVASTATIN CALCIUM 20 MG PO TABS
20.0000 mg | ORAL_TABLET | Freq: Every day | ORAL | Status: DC
  Administered 2012-02-27 – 2012-03-01 (×4): 20 mg via ORAL
  Filled 2012-02-27 (×5): qty 1

## 2012-02-27 MED ORDER — MENTHOL 3 MG MT LOZG: 1.0000 | LOZENGE | OROMUCOSAL | Status: DC | PRN

## 2012-02-27 MED ORDER — ADULT MULTIVITAMIN W/MINERALS CH
1.0000 | ORAL_TABLET | Freq: Every day | ORAL | Status: DC
  Administered 2012-02-27 – 2012-03-02 (×5): 1 via ORAL
  Filled 2012-02-27 (×5): qty 1

## 2012-02-27 MED ORDER — DIPHENHYDRAMINE HCL 50 MG/ML IJ SOLN: 12.5000 mg | Freq: Four times a day (QID) | INTRAMUSCULAR | Status: DC | PRN

## 2012-02-27 MED ORDER — MIDAZOLAM HCL 5 MG/5ML IJ SOLN
INTRAMUSCULAR | Status: DC | PRN
  Administered 2012-02-27: 2 mg via INTRAVENOUS

## 2012-02-27 MED ORDER — SODIUM CHLORIDE 0.9 % IJ SOLN: 3.0000 mL | INTRAMUSCULAR | Status: DC | PRN

## 2012-02-27 MED ORDER — SODIUM CHLORIDE 0.9 % IR SOLN
Status: DC | PRN
  Administered 2012-02-27: 15:00:00

## 2012-02-27 MED ORDER — METHOCARBAMOL 500 MG PO TABS: 500.0000 mg | ORAL_TABLET | Freq: Four times a day (QID) | ORAL | Status: DC | PRN

## 2012-02-27 MED ORDER — ZOLPIDEM TARTRATE 5 MG PO TABS: 5.0000 mg | ORAL_TABLET | Freq: Every evening | ORAL | Status: DC | PRN

## 2012-02-27 MED ORDER — OXYCODONE-ACETAMINOPHEN 5-325 MG PO TABS
1.0000 | ORAL_TABLET | ORAL | Status: DC | PRN
  Administered 2012-03-01: 2 via ORAL
  Filled 2012-02-27: qty 2

## 2012-02-27 MED ORDER — HYDROCODONE-ACETAMINOPHEN 10-325 MG PO TABS
1.0000 | ORAL_TABLET | Freq: Three times a day (TID) | ORAL | Status: DC | PRN
  Administered 2012-02-29 – 2012-03-02 (×4): 2 via ORAL
  Filled 2012-02-27 (×4): qty 2

## 2012-02-27 MED ORDER — LIDOCAINE-EPINEPHRINE 1 %-1:100000 IJ SOLN
INTRAMUSCULAR | Status: DC | PRN
  Administered 2012-02-27: 10 mL

## 2012-02-27 MED ORDER — DOCUSATE SODIUM 100 MG PO CAPS
100.0000 mg | ORAL_CAPSULE | Freq: Two times a day (BID) | ORAL | Status: DC
  Administered 2012-02-27 – 2012-03-02 (×8): 100 mg via ORAL
  Filled 2012-02-27 (×7): qty 1

## 2012-02-27 MED ORDER — SUFENTANIL CITRATE 50 MCG/ML IV SOLN
INTRAVENOUS | Status: DC | PRN
  Administered 2012-02-27: 20 ug via INTRAVENOUS
  Administered 2012-02-27: 10 ug via INTRAVENOUS

## 2012-02-27 MED ORDER — KETOCONAZOLE 2 % EX CREA
1.0000 "application " | TOPICAL_CREAM | Freq: Every day | CUTANEOUS | Status: DC
  Administered 2012-02-27 – 2012-03-02 (×4): 1 via TOPICAL
  Filled 2012-02-27: qty 15

## 2012-02-27 MED ORDER — CEFAZOLIN SODIUM 1-5 GM-% IV SOLN
1.0000 g | Freq: Three times a day (TID) | INTRAVENOUS | Status: AC
  Administered 2012-02-27 – 2012-02-28 (×2): 1 g via INTRAVENOUS
  Filled 2012-02-27 (×3): qty 50

## 2012-02-27 MED ORDER — DIPHENHYDRAMINE HCL 12.5 MG/5ML PO ELIX: 12.5000 mg | ORAL_SOLUTION | Freq: Four times a day (QID) | ORAL | Status: DC | PRN

## 2012-02-27 MED ORDER — BUPIVACAINE HCL (PF) 0.5 % IJ SOLN
INTRAMUSCULAR | Status: DC | PRN
  Administered 2012-02-27: 10 mL

## 2012-02-27 MED ORDER — ALBUTEROL SULFATE HFA 108 (90 BASE) MCG/ACT IN AERS
INHALATION_SPRAY | RESPIRATORY_TRACT | Status: DC | PRN
  Administered 2012-02-27: 2 via RESPIRATORY_TRACT

## 2012-02-27 MED ORDER — VANCOMYCIN HCL 1000 MG IV SOLR
1000.0000 mg | INTRAVENOUS | Status: DC | PRN
  Administered 2012-02-27: 1000 mg via INTRAVENOUS

## 2012-02-27 MED ORDER — PROPOFOL 10 MG/ML IV BOLUS
INTRAVENOUS | Status: DC | PRN
  Administered 2012-02-27: 200 mg via INTRAVENOUS

## 2012-02-27 MED ORDER — VANCOMYCIN HCL IN DEXTROSE 1-5 GM/200ML-% IV SOLN
1000.0000 mg | Freq: Two times a day (BID) | INTRAVENOUS | Status: AC
Start: 2012-02-28 — End: 2012-02-28
  Administered 2012-02-28 (×2): 1000 mg via INTRAVENOUS
  Filled 2012-02-27 (×2): qty 200

## 2012-02-27 MED ORDER — ONDANSETRON HCL 4 MG/2ML IJ SOLN: 4.0000 mg | INTRAMUSCULAR | Status: DC | PRN

## 2012-02-27 MED ORDER — LABETALOL HCL 100 MG PO TABS
100.0000 mg | ORAL_TABLET | Freq: Two times a day (BID) | ORAL | Status: DC
  Administered 2012-02-27 – 2012-03-02 (×4): 100 mg via ORAL
  Filled 2012-02-27 (×9): qty 1

## 2012-02-27 MED ORDER — AMLODIPINE BESYLATE 10 MG PO TABS
10.0000 mg | ORAL_TABLET | Freq: Every day | ORAL | Status: DC
  Administered 2012-02-28 – 2012-03-02 (×4): 10 mg via ORAL
  Filled 2012-02-27 (×5): qty 1

## 2012-02-27 MED ORDER — PAROXETINE HCL 10 MG PO TABS
5.0000 mg | ORAL_TABLET | Freq: Every day | ORAL | Status: DC
  Administered 2012-02-28 – 2012-03-02 (×4): 5 mg via ORAL
  Filled 2012-02-27 (×4): qty 0.5

## 2012-02-27 MED ORDER — TERBINAFINE HCL 250 MG PO TABS
125.0000 mg | ORAL_TABLET | Freq: Every day | ORAL | Status: DC
  Administered 2012-02-28 – 2012-03-02 (×4): 125 mg via ORAL
  Filled 2012-02-27 (×5): qty 1

## 2012-02-27 MED ORDER — PROMETHAZINE HCL 25 MG/ML IJ SOLN
INTRAMUSCULAR | Status: AC
  Filled 2012-02-27: qty 1

## 2012-02-27 MED ORDER — SODIUM CHLORIDE 0.9 % IJ SOLN: 9.0000 mL | INTRAMUSCULAR | Status: DC | PRN

## 2012-02-27 MED ORDER — CYCLOBENZAPRINE HCL 10 MG PO TABS
10.0000 mg | ORAL_TABLET | Freq: Three times a day (TID) | ORAL | Status: DC | PRN
  Administered 2012-02-28 – 2012-03-01 (×4): 10 mg via ORAL
  Filled 2012-02-27 (×5): qty 1

## 2012-02-27 MED ORDER — SODIUM CHLORIDE 0.9 % IJ SOLN
3.0000 mL | Freq: Two times a day (BID) | INTRAMUSCULAR | Status: DC
  Administered 2012-02-29 – 2012-03-01 (×2): 3 mL via INTRAVENOUS

## 2012-02-27 MED ORDER — BISACODYL 10 MG RE SUPP: 10.0000 mg | Freq: Every day | RECTAL | Status: DC | PRN

## 2012-02-27 MED ORDER — HEPARIN SODIUM (PORCINE) 1000 UNIT/ML IJ SOLN
INTRAMUSCULAR | Status: AC
  Filled 2012-02-27: qty 1

## 2012-02-27 MED ORDER — SODIUM CHLORIDE 0.9 % IV SOLN: 250.0000 mL | INTRAVENOUS | Status: DC

## 2012-02-27 MED ORDER — 0.9 % SODIUM CHLORIDE (POUR BTL) OPTIME
TOPICAL | Status: DC | PRN
  Administered 2012-02-27: 1000 mL

## 2012-02-27 MED ORDER — HYDROCORTISONE 1 % EX CREA
TOPICAL_CREAM | Freq: Three times a day (TID) | CUTANEOUS | Status: DC
  Administered 2012-02-27 – 2012-03-02 (×4): via TOPICAL
  Filled 2012-02-27: qty 28

## 2012-02-27 MED ORDER — ACETAMINOPHEN 650 MG RE SUPP: 650.0000 mg | RECTAL | Status: DC | PRN

## 2012-02-27 MED ORDER — DIAZEPAM 5 MG PO TABS
10.0000 mg | ORAL_TABLET | Freq: Three times a day (TID) | ORAL | Status: DC | PRN
  Administered 2012-02-27 – 2012-03-01 (×4): 10 mg via ORAL
  Filled 2012-02-27 (×4): qty 2

## 2012-02-27 MED ORDER — FLEET ENEMA 7-19 GM/118ML RE ENEM: 1.0000 | ENEMA | Freq: Once | RECTAL | Status: AC | PRN

## 2012-02-27 MED ORDER — LIDOCAINE HCL (CARDIAC) 20 MG/ML IV SOLN
INTRAVENOUS | Status: DC | PRN
  Administered 2012-02-27: 100 mg via INTRAVENOUS

## 2012-02-27 MED ORDER — ONE-DAILY MULTI VITAMINS PO TABS: 1.0000 | ORAL_TABLET | Freq: Every day | ORAL | Status: DC

## 2012-02-27 MED ORDER — METHOCARBAMOL 100 MG/ML IJ SOLN
500.0000 mg | Freq: Four times a day (QID) | INTRAVENOUS | Status: DC | PRN
  Filled 2012-02-27: qty 5

## 2012-02-27 MED ORDER — NALOXONE HCL 0.4 MG/ML IJ SOLN: 0.4000 mg | INTRAMUSCULAR | Status: DC | PRN

## 2012-02-27 MED ORDER — HYDROMORPHONE 0.3 MG/ML IV SOLN
INTRAVENOUS | Status: DC
  Administered 2012-02-27: 20:00:00 via INTRAVENOUS
  Administered 2012-02-28: 2.4 mg via INTRAVENOUS
  Administered 2012-02-28: 1.8 mg via INTRAVENOUS
  Administered 2012-02-28: 0.6 mg via INTRAVENOUS
  Administered 2012-02-28: 7.5 mg via INTRAVENOUS
  Administered 2012-02-28: 1.2 mg via INTRAVENOUS
  Administered 2012-02-28: 2.1 mg via INTRAVENOUS
  Administered 2012-02-28: 3.84 mg via INTRAVENOUS
  Administered 2012-02-28: 0.3 mg via INTRAVENOUS
  Administered 2012-02-29: 1.5 mg via INTRAVENOUS
  Administered 2012-02-29: 3.6 mg via INTRAVENOUS
  Administered 2012-02-29: 3.7 mg via INTRAVENOUS
  Administered 2012-02-29: 2.1 mg via INTRAVENOUS
  Administered 2012-02-29: 1.64 mg via INTRAVENOUS
  Administered 2012-02-29: 1.8 mg via INTRAVENOUS
  Administered 2012-03-01: 1.5 mg via INTRAVENOUS
  Filled 2012-02-27 (×4): qty 25

## 2012-02-27 MED ORDER — ALBUTEROL SULFATE HFA 108 (90 BASE) MCG/ACT IN AERS: 2.0000 | INHALATION_SPRAY | Freq: Four times a day (QID) | RESPIRATORY_TRACT | Status: DC | PRN

## 2012-02-27 MED ORDER — PANTOPRAZOLE SODIUM 40 MG PO TBEC
40.0000 mg | DELAYED_RELEASE_TABLET | Freq: Every day | ORAL | Status: DC
  Administered 2012-02-28 – 2012-03-02 (×4): 40 mg via ORAL
  Filled 2012-02-27 (×3): qty 1

## 2012-02-27 SURGICAL SUPPLY — 97 items
BAG DECANTER FOR FLEXI CONT (MISCELLANEOUS) ×3 IMPLANT
BANDAGE GAUZE 4  KLING STR (GAUZE/BANDAGES/DRESSINGS) IMPLANT
BANDAGE GAUZE ELAST BULKY 4 IN (GAUZE/BANDAGES/DRESSINGS) IMPLANT
BENZOIN TINCTURE PRP APPL 2/3 (GAUZE/BANDAGES/DRESSINGS) IMPLANT
BLADE SURG 10 STRL SS (BLADE) IMPLANT
BLADE SURG 15 STRL LF DISP TIS (BLADE) IMPLANT
BLADE SURG 15 STRL SS (BLADE)
BLADE SURG ROTATE 9660 (MISCELLANEOUS) IMPLANT
BONE VOID FILLER STRIP 10CC (Bone Implant) ×3 IMPLANT
BRUSH SCRUB EZ PLAIN DRY (MISCELLANEOUS) IMPLANT
BUR MATCHSTICK NEURO 3.0 LAGG (BURR) IMPLANT
BUR PRECISION FLUTE 5.0 (BURR) IMPLANT
CANISTER SUCTION 2500CC (MISCELLANEOUS) ×3 IMPLANT
CLOTH BEACON ORANGE TIMEOUT ST (SAFETY) ×3 IMPLANT
CONT SPEC 4OZ CLIKSEAL STRL BL (MISCELLANEOUS) ×3 IMPLANT
CORDS BIPOLAR (ELECTRODE) IMPLANT
COVER BACK TABLE 24X17X13 BIG (DRAPES) IMPLANT
COVER TABLE BACK 60X90 (DRAPES) ×3 IMPLANT
DERMABOND ADVANCED (GAUZE/BANDAGES/DRESSINGS)
DERMABOND ADVANCED .7 DNX12 (GAUZE/BANDAGES/DRESSINGS) IMPLANT
DRAPE C-ARM 42X72 X-RAY (DRAPES) ×6 IMPLANT
DRAPE EXTREMITY T 121X128X90 (DRAPE) IMPLANT
DRAPE LAPAROTOMY 100X72X124 (DRAPES) ×3 IMPLANT
DRAPE MICROSCOPE LEICA (MISCELLANEOUS) ×6 IMPLANT
DRAPE POUCH INSTRU U-SHP 10X18 (DRAPES) ×3 IMPLANT
DRAPE PROXIMA HALF (DRAPES) IMPLANT
DRAPE SURG 17X23 STRL (DRAPES) ×3 IMPLANT
DRESSING TELFA 8X3 (GAUZE/BANDAGES/DRESSINGS) IMPLANT
DURAFORM COLLAGEN 1X1 5-PACK (Neuro Prosthesis/Implant) ×3 IMPLANT
DURAPREP 26ML APPLICATOR (WOUND CARE) ×3 IMPLANT
DURASEAL SPINE SEALANT 3ML (MISCELLANEOUS) ×3 IMPLANT
ELECT REM PT RETURN 9FT ADLT (ELECTROSURGICAL) ×3
ELECTRODE REM PT RTRN 9FT ADLT (ELECTROSURGICAL) ×2 IMPLANT
GAUZE SPONGE 4X4 16PLY XRAY LF (GAUZE/BANDAGES/DRESSINGS) IMPLANT
GLOVE BIO SURGEON STRL SZ8 (GLOVE) ×3 IMPLANT
GLOVE BIOGEL PI IND STRL 7.0 (GLOVE) ×4 IMPLANT
GLOVE BIOGEL PI IND STRL 7.5 (GLOVE) ×2 IMPLANT
GLOVE BIOGEL PI IND STRL 8 (GLOVE) ×2 IMPLANT
GLOVE BIOGEL PI IND STRL 8.5 (GLOVE) ×2 IMPLANT
GLOVE BIOGEL PI INDICATOR 7.0 (GLOVE) ×2
GLOVE BIOGEL PI INDICATOR 7.5 (GLOVE) ×1
GLOVE BIOGEL PI INDICATOR 8 (GLOVE) ×1
GLOVE BIOGEL PI INDICATOR 8.5 (GLOVE) ×1
GLOVE ECLIPSE 7.5 STRL STRAW (GLOVE) ×6 IMPLANT
GLOVE ECLIPSE 8.0 STRL XLNG CF (GLOVE) ×3 IMPLANT
GLOVE EXAM NITRILE LRG STRL (GLOVE) IMPLANT
GLOVE EXAM NITRILE MD LF STRL (GLOVE) ×3 IMPLANT
GLOVE EXAM NITRILE XL STR (GLOVE) IMPLANT
GLOVE EXAM NITRILE XS STR PU (GLOVE) IMPLANT
GLOVE SURG SS PI 6.5 STRL IVOR (GLOVE) ×9 IMPLANT
GOWN BRE IMP SLV AUR LG STRL (GOWN DISPOSABLE) ×3 IMPLANT
GOWN BRE IMP SLV AUR XL STRL (GOWN DISPOSABLE) ×6 IMPLANT
GOWN STRL REIN 2XL LVL4 (GOWN DISPOSABLE) ×3 IMPLANT
KIT BASIN OR (CUSTOM PROCEDURE TRAY) ×3 IMPLANT
KIT INFUSE X SMALL 1.4CC (Orthopedic Implant) ×3 IMPLANT
KIT POSITION SURG JACKSON T1 (MISCELLANEOUS) IMPLANT
KIT ROOM TURNOVER OR (KITS) ×3 IMPLANT
LOCATOR NERVE 3 VOLT (DISPOSABLE) IMPLANT
LOOP VESSEL MAXI BLUE (MISCELLANEOUS) IMPLANT
MILL MEDIUM DISP (BLADE) IMPLANT
NEEDLE HYPO 25X1 1.5 SAFETY (NEEDLE) ×3 IMPLANT
NEEDLE SPNL 18GX3.5 QUINCKE PK (NEEDLE) IMPLANT
NS IRRIG 1000ML POUR BTL (IV SOLUTION) ×3 IMPLANT
PACK LAMINECTOMY NEURO (CUSTOM PROCEDURE TRAY) ×3 IMPLANT
PACK SURGICAL SETUP 50X90 (CUSTOM PROCEDURE TRAY) IMPLANT
PAD ARMBOARD 7.5X6 YLW CONV (MISCELLANEOUS) ×15 IMPLANT
PATTIES SURGICAL .5 X.5 (GAUZE/BANDAGES/DRESSINGS) IMPLANT
PATTIES SURGICAL .5 X1 (DISPOSABLE) IMPLANT
PATTIES SURGICAL 1X1 (DISPOSABLE) IMPLANT
PENCIL BUTTON HOLSTER BLD 10FT (ELECTRODE) IMPLANT
RUBBERBAND STERILE (MISCELLANEOUS) ×3 IMPLANT
SPONGE GAUZE 4X4 12PLY (GAUZE/BANDAGES/DRESSINGS) IMPLANT
SPONGE LAP 4X18 X RAY DECT (DISPOSABLE) IMPLANT
SPONGE SURGIFOAM ABS GEL 100 (HEMOSTASIS) ×3 IMPLANT
STAPLER SKIN PROX WIDE 3.9 (STAPLE) IMPLANT
STOCKINETTE 4X48 STRL (DRAPES) IMPLANT
STRIP CLOSURE SKIN 1/2X4 (GAUZE/BANDAGES/DRESSINGS) IMPLANT
SUT ETHILON 3 0 PS 1 (SUTURE) IMPLANT
SUT SILK 2 0 FS (SUTURE) IMPLANT
SUT VIC AB 1 CT1 18XBRD ANBCTR (SUTURE) ×2 IMPLANT
SUT VIC AB 1 CT1 8-18 (SUTURE) ×1
SUT VIC AB 2-0 CP2 18 (SUTURE) IMPLANT
SUT VIC AB 2-0 CT1 18 (SUTURE) ×6 IMPLANT
SUT VIC AB 3-0 SH 8-18 (SUTURE) ×3 IMPLANT
SYR 20ML ECCENTRIC (SYRINGE) ×3 IMPLANT
SYR 3ML LL SCALE MARK (SYRINGE) IMPLANT
SYR 5ML LL (SYRINGE) IMPLANT
SYR BULB 3OZ (MISCELLANEOUS) IMPLANT
SYR CONTROL 10ML LL (SYRINGE) IMPLANT
TAPE CLOTH SURG 4X10 WHT LF (GAUZE/BANDAGES/DRESSINGS) ×3 IMPLANT
TOWEL OR 17X24 6PK STRL BLUE (TOWEL DISPOSABLE) ×3 IMPLANT
TOWEL OR 17X26 10 PK STRL BLUE (TOWEL DISPOSABLE) ×3 IMPLANT
TRAP SPECIMEN MUCOUS 40CC (MISCELLANEOUS) ×3 IMPLANT
TRAY FOLEY CATH 14FRSI W/METER (CATHETERS) ×3 IMPLANT
TUBE CONNECTING 12X1/4 (SUCTIONS) IMPLANT
UNDERPAD 30X30 INCONTINENT (UNDERPADS AND DIAPERS) IMPLANT
WATER STERILE IRR 1000ML POUR (IV SOLUTION) ×3 IMPLANT

## 2012-02-27 NOTE — Progress Notes (Signed)
Patient awake, alert and conversant.  Moving all extremities with good strength.  Denies leg pain.  Doing well.

## 2012-02-27 NOTE — Progress Notes (Signed)
Spoke with Dr. Gypsy Balsam regarding pacemaker form signature not being able to be made out. Dr. Gypsy Balsam reports being ok to use it. Notified Heather in neuro OR that [employer] needs to be contacted when they send for patient.

## 2012-02-27 NOTE — Progress Notes (Signed)
Patient reports having $10 in wallet that staff did not see. Patient reports that he does not want security to lock it up pt informed that hospital could not be responsible for it.

## 2012-02-27 NOTE — Op Note (Signed)
02/27/2012  7:39 PM  PATIENT:  Jason Yu  61 y.o. 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  PROCEDURE:  Procedure(s) (LRB): Exploration of fusion with POSTERIOR LUMBAR Decompression with microdissection and FUSION 1 LEVEL with posterolateral arthrodesis (N/A)  SURGEON:  Surgeon(s) and Role:    * Maeola Harman, MD - Primary    * Reinaldo Meeker, MD - Assisting  PHYSICIAN ASSISTANT:   ASSISTANTS: Poteat, RN   ANESTHESIA:   general  EBL:     BLOOD ADMINISTERED:none  DRAINS: none   LOCAL MEDICATIONS USED:  LIDOCAINE   SPECIMEN:  No Specimen  DISPOSITION OF SPECIMEN:  N/A  COUNTS:  YES  DICTATION: Patient is 61 year old man with prior fusion at L2/3 with lumbar pseudoarthrosis and retropulsion of bone graft and cage causing compression of the right side of the thecal sac, right L 2 and L 3 nerve roots. It was elected to take him to surgery for exploration of prior fusion with redo decompression and fusion at the L 4 / 5 level .   Procedure: Patient was placed in a prone position on the OR table after smooth and uncomplicated induction of general endotracheal anesthesia. His low back was prepped and draped in usual sterile fashion with DuraPrep. Area of incision was infiltrated with local lidocaine. Incision was made to the lumbodorsal fascia was incised and exposure was performed of the previously placed hardware. 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 There did not appear to be significant loosening of screws.  After careful exploration, there appeared not to be good bridging bone growth with mobility at the previously operated level. Using painstaking microdissection through scar tissue with the operating  microscope, the remaining bone lateral to the thecal sac on the right was removed, the edge of the thecal sac was identified, and the L 4 and L 5 nerve roots were thoroughly decompressed.  Ectopic bone was removed and the previously placed TLIF cage was exposed.  This did  appear to be loose and it was elected to tamp the cage into the disc space, so that it was no longer compressing the thecal sac after it was determined that it would be extremely difficult to remove the cage given the significant scarring around it.  Once the cage had been repositioned (as confirmed with intraoperative fluoroscopy), the screws at L2 and L3 were compressed around the grafts and locked in position. At the time of dissecting along the lateral thecal sac on the right, through adherent scar tissue, an opening was made in the dura with exposure of arachnoid, but without leakage of CSF.  This area was reinforced with duraform and dural sealant. A thorough decortication of the bilateral transverse processes of L2 and L3 was performed. facet and posterolateral rgion was performed and  This area was packed with extra small BMP and NexOss bone graft extender (10cc).  Final x-rays demonstrated well-positioned interbody graft and pedicle screw fixation.  Fascia was closed with 1 Vicryl sutures skin edges were reapproximated 2 and 3-0 Vicryl sutures. The wound is dressed with benzoin Steri-Strips Telfa gauze and tape the patient was extubated in the operating room and taken to recovery in stable satisfactory condition. Counts were correct at the end of the case.  PLAN OF CARE:  Admit to inpatient   PATIENT DISPOSITION:  PACU - hemodynamically stable.   Delay start of Pharmacological VTE agent (>24hrs) due to surgical blood loss or risk of bleeding: yes

## 2012-02-27 NOTE — Anesthesia Preprocedure Evaluation (Addendum)
Anesthesia Evaluation  Patient identified by MRN, date of birth, ID band Patient awake    Reviewed: Allergy & Precautions, H&P , NPO status , Patient's Chart, lab work & pertinent test results, reviewed documented beta blocker date and time   History of Anesthesia Complications (+) PONV  Airway Mallampati: II TM Distance: >3 FB Neck ROM: Limited    Dental  (+) Teeth Intact   Pulmonary shortness of breath and with exertion, asthma , pneumonia -,  + rhonchi         Cardiovascular hypertension, Pt. on medications and Pt. on home beta blockers + dysrhythmias + pacemaker Rhythm:Regular Rate:Normal  ekg w BBpaced rate   Neuro/Psych Anxiety    GI/Hepatic GERD-  ,(+) Hepatitis -, A and B  Endo/Other    Renal/GU      Musculoskeletal   Abdominal   Peds  Hematology   Anesthesia Other Findings   Reproductive/Obstetrics                          Anesthesia Physical Anesthesia Plan  ASA: III  Anesthesia Plan: General   Post-op Pain Management:    Induction: Intravenous  Airway Management Planned: Oral ETT  Additional Equipment:   Intra-op Plan:   Post-operative Plan: Extubation in OR  Informed Consent: I have reviewed the patients History and Physical, chart, labs and discussed the procedure including the risks, benefits and alternatives for the proposed anesthesia with the patient or authorized representative who has indicated his/her understanding and acceptance.   Dental advisory given  Plan Discussed with: CRNA and Surgeon  Anesthesia Plan Comments:        Anesthesia Quick Evaluation

## 2012-02-27 NOTE — Transfer of Care (Signed)
Immediate Anesthesia Transfer of Care Note  Patient: Jason Yu  Procedure(s) Performed: Procedure(s) (LRB): POSTERIOR LUMBAR FUSION 1 LEVEL (N/A)  Patient Location: PACU  Anesthesia Type: General  Level of Consciousness: awake, alert  and oriented  Airway & Oxygen Therapy: Patient Spontanous Breathing and Patient connected to nasal cannula oxygen  Post-op Assessment: Report given to PACU RN, Post -op Vital signs reviewed and stable and Patient moving all extremities  Post vital signs: Reviewed and stable  Complications: No apparent anesthesia complications

## 2012-02-27 NOTE — Anesthesia Postprocedure Evaluation (Signed)
  Anesthesia Post-op Note  Patient: Jason Yu  Procedure(s) Performed: Procedure(s) (LRB): POSTERIOR LUMBAR FUSION 1 LEVEL (N/A)  Patient Location: PACU  Anesthesia Type: General  Level of Consciousness: awake, oriented, sedated and patient cooperative  Airway and Oxygen Therapy: Patient Spontanous Breathing and Patient connected to nasal cannula oxygen  Post-op Pain: mild  Post-op Assessment: Post-op Vital signs reviewed, Patient's Cardiovascular Status Stable, Respiratory Function Stable, Patent Airway, No signs of Nausea or vomiting and Pain level controlled  Post-op Vital Signs: stable  Complications: No apparent anesthesia complications

## 2012-02-27 NOTE — Interval H&P Note (Signed)
History and Physical Interval Note:  02/27/2012 6:34 AM  Jason Yu  has presented today for surgery, with the diagnosis of Ulnar nerve lesion, Mechanical failure, Lumbar stenosis  The various methods of treatment have been discussed with the patient and family. After consideration of risks, benefits and other options for treatment, the patient has consented to  Procedure(s) (LRB): POSTERIOR LUMBAR FUSION 1 LEVEL (N/A) ULNAR NERVE DECOMPRESSION/TRANSPOSITION (Right) as a surgical intervention .  The patient's history has been reviewed, patient examined, no change in status, stable for surgery.  I have reviewed the patient's chart and labs.  Questions were answered to the patient's satisfaction.     Adriaan Maltese D  Date of Initial H&P: 02/26/2012  History reviewed, patient examined, no change in status, stable for surgery.

## 2012-02-28 NOTE — Progress Notes (Signed)
Subjective: Patient reports "I think I'm doing alright, just some pain in my back"  Objective: Vital signs in last 24 hours: Temp:  [97.5 F (36.4 C)-97.9 F (36.6 C)] 97.9 F (36.6 C) (08/30 1013) Pulse Rate:  [65-88] 84  (08/30 1013) Resp:  [8-19] 18  (08/30 1013) BP: (101-153)/(64-87) 108/68 mmHg (08/30 1013) SpO2:  [95 %-100 %] 100 % (08/30 1013) Weight:  [81.7 kg (180 lb 1.9 oz)] 81.7 kg (180 lb 1.9 oz) (08/29 2200)  Intake/Output from previous day: 08/29 0701 - 08/30 0700 In: 1700 [I.V.:1700] Out: 550 [Urine:350; Blood:200] Intake/Output this shift:    Sitting up in chair, alert, conversant. Good stength BLE. Pt understands need for bedrest x1 more day to allow healing of dural repair.  Evaluation of incision once supine reveals bloody drainage, none active, and no obvious evidence of csf.  Lab Results: No results found for this basename: WBC:2,HGB:2,HCT:2,PLT:2 in the last 72 hours BMET No results found for this basename: NA:2,K:2,CL:2,CO2:2,GLUCOSE:2,BUN:2,CREATININE:2,CALCIUM:2 in the last 72 hours  Studies/Results: Dg Lumbar Spine 1 View  02/27/2012  *RADIOLOGY REPORT*  Clinical Data: Low back pain  LUMBAR SPINE - 1 VIEW  Comparison: 01/22/2012.  Findings: C-arm films document reposition of L2-L3 interbody fusion cage.  IMPRESSION: As above   Original Report Authenticated By: Elsie Stain, M.D.    Dg C-arm [phone] Min  02/27/2012  See accompanying dictation for lumbar spine one-view.   Original Report Authenticated By: Elsie Stain, M.D.     Assessment/Plan: Improving  LOS: 1 day  Continue flat bedrest (may roll onto side periodically, but keep bed flat) until tomorrow. Plan to gradually elevate HOB tomorrow to sitting /standing/walking tomorrow afternoon/evening.   Georgiann Cocker 02/28/2012, 10:51 AM

## 2012-02-28 NOTE — Evaluation (Deleted)
Physical Therapy Evaluation Patient Details Name: Jason Yu MRN: [id number] DOB: [birth year] Today's Date: 02/28/2012 Time: 0830-0900 PT Time Calculation (min): 30 min  PT Assessment / Plan / Recommendation Clinical Impression       PT Assessment       Follow Up Recommendations       Barriers to Discharge        Equipment Recommendations       Recommendations for Other Services     Frequency      Precautions / Restrictions Precautions Precautions: Back Required Braces or Orthoses:  (brace not ordered but pt has one from prior surgeries) Restrictions Weight Bearing Restrictions: No   Pertinent Vitals/Pain *7/10 low back with walking Pt used PCA prior to walking, rest and repositioned**      Mobility  Bed Mobility Bed Mobility: Right Sidelying to Sit Right Sidelying to Sit: 6: Modified independent (Device/Increase time);With rails;HOB flat Transfers Transfers: Sit to Stand;Stand to Sit Sit to Stand: 5: Supervision;From bed;With upper extremity assist Stand to Sit: 5: Supervision;To chair/3-in-1;With upper extremity assist Ambulation/Gait Ambulation/Gait Assistance: 5: Supervision Ambulation Distance (Feet): 200 Feet Assistive device: Rolling walker Gait Pattern: Within Functional Limits General Gait Details: no LOB, steady with RW    Exercises     PT Diagnosis:    PT Problem List:   PT Treatment Interventions:     PT Goals Acute Rehab PT Goals PT Goal Formulation: With patient Time For Goal Achievement: 03/03/12 Potential to Achieve Goals: Good Pt will go Supine/Side to Sit: Independently;with HOB 0 degrees PT Goal: Supine/Side to Sit - Progress: Goal set today Pt will go Sit to Stand: Independently PT Goal: Sit to Stand - Progress: Goal set today Pt will Ambulate: >150 feet;with modified independence PT Goal: Ambulate - Progress: Goal set today Pt will Go Up / Down Stairs: 1-2 stairs;with modified independence;with rail(s) PT Goal: Up/Down  Stairs - Progress: Goal set today  Visit Information  Last PT Received On: 02/28/12 Assistance Needed: +1    Subjective Data  Subjective: I got up a couple times this morning. I feel weak.  Patient Stated Goal: return to 4 wheeling and boating   Prior Functioning  Home Living Lives With: Alone Home Access: Stairs to enter Entrance Stairs-Number of Steps: 2 Entrance Stairs-Rails: Can reach both;Left;Right Home Layout: One level Bathroom Toilet: Standard Home Adaptive Equipment: Walker - rolling;Shower chair without back Prior Function Level of Independence: Independent Able to Take Stairs?: Yes Driving: Yes Vocation: On disability Communication Communication: No difficulties Dominant Hand: Right    Cognition  Overall Cognitive Status: Appears within functional limits for tasks assessed/performed Arousal/Alertness: Awake/alert Orientation Level: Appears intact for tasks assessed Behavior During Session: [name] for tasks performed    Extremity/Trunk Assessment Right Upper Extremity Assessment RUE ROM/Strength/Tone: [name] [name] for tasks assessed (per chart pt has C8 nerve compression) Left Upper Extremity Assessment LUE ROM/Strength/Tone: WFL for tasks assessed Right Lower Extremity Assessment RLE ROM/Strength/Tone: Within functional levels RLE Sensation: WFL - Light Touch RLE Coordination: WFL - gross/fine motor Left Lower Extremity Assessment LLE ROM/Strength/Tone: Within functional levels LLE Sensation: WFL - Light Touch LLE Coordination: WFL - gross/fine motor Trunk Assessment Trunk Assessment: Normal   Balance Balance Balance Assessed: Yes Static Sitting Balance Static Sitting - Balance Support: Right upper extremity supported;Feet supported Static Sitting - Level of Assistance: 7: Independent Static Sitting - Comment/# of Minutes: 2  End of Session    GP     Jason Yu 02/28/2012, 9:08 AM [phone]

## 2012-02-28 NOTE — Progress Notes (Signed)
Flat bedrest through today

## 2012-02-28 NOTE — Care Management Note (Signed)
    Page 1 of 1   02/28/2012     12:49:29 PM   CARE MANAGEMENT NOTE 02/28/2012  Patient:  Jason Yu, Jason Yu   Account Number:  [id number]  Date Initiated:  02/28/2012  Documentation initiated by:  Onnie Boer  Subjective/Objective Assessment:   PT WAS ADMITTED FOR BACK SURGERY     Action/Plan:   PROGRESSION OF CARE AND DISCHARGE PLANNING   Anticipated DC Date:  03/01/2012   Anticipated DC Plan:  HOME W HOME HEALTH SERVICES      DC Planning Services  CM consult      Choice offered to / List presented to:             Status of service:  In process, will continue to follow Medicare Important Message given?   (If response is "NO", the following Medicare IM given date fields will be blank) Date Medicare IM given:   Date Additional Medicare IM given:    Discharge Disposition:    Per UR Regulation:  Reviewed for med. necessity/level of care/duration of stay  If discussed at Long Length of Stay Meetings, dates discussed:    Comments:  02/28/12 Onnie Boer, RN, BSN 1245 PT WAS ADMITTED FOR A REDO OF A FUSION.  PTA PT WAS AT HOME WITH SELF CARE.  WILL F/U ON DC NEEDS AND RECOMMENDATIONS.

## 2012-02-28 NOTE — Progress Notes (Signed)
Clinical Social Work: Coverage for 4N  Received consult and referral for nursing home placement, however at this time patient doing well and PT reports not needs from PT. Patient to dc home with home health services and reviewed with CM.  No other needs at this time. Will sign off, if needs arise, please re-consult.  Ashley Jacobs, MSW LCSW [phone]

## 2012-02-28 NOTE — Evaluation (Signed)
Physical Therapy Evaluation Patient Details Name: Jason Yu MRN: [id number] DOB: 1951-05-01 Today's Date: 02/28/2012 Time: 0830-0900 PT Time Calculation (min): 30 min  PT Assessment / Plan / Recommendation Clinical Impression  61 y.o. male who is POD #1 for lumbar decompression/microdiscectomy. Pt has had 2 prior back surgeries, cervical fusion, and BUE surgeries. Pt ambulated 200' with a RW with supervision. Pt lives alone and should progress well enough to DC home.  He would benefit from acute PT to maximize safety and independence with mobility.     PT Assessment  Patient needs continued PT services    Follow Up Recommendations  No PT follow up    Barriers to Discharge        Equipment Recommendations  None recommended by PT    Recommendations for Other Services OT consult   Frequency 7X/week    Precautions / Restrictions Precautions Precautions: Back Required Braces or Orthoses:  (brace not ordered but pt has one from prior surgeries) Restrictions Weight Bearing Restrictions: No   Pertinent Vitals/Pain *7/10 LBP Pt using PCA**      Mobility  Bed Mobility Bed Mobility: Right Sidelying to Sit Right Sidelying to Sit: 6: Modified independent (Device/Increase time);With rails;HOB flat Transfers Transfers: Sit to Stand;Stand to Sit Sit to Stand: 5: Supervision;From bed;With upper extremity assist Stand to Sit: 5: Supervision;To chair/3-in-1;With upper extremity assist Ambulation/Gait Ambulation/Gait Assistance: 5: Supervision Ambulation Distance (Feet): 200 Feet Assistive device: Rolling walker Gait Pattern: Within Functional Limits General Gait Details: no LOB, steady with RW    Exercises     PT Diagnosis: Acute pain  PT Problem List: Decreased activity tolerance;Pain PT Treatment Interventions: Stair training;Gait training;Functional mobility training;Therapeutic activities;Therapeutic exercise;Patient/family education   PT Goals Acute Rehab PT Goals PT  Goal Formulation: With patient Time For Goal Achievement: 03/03/12 Potential to Achieve Goals: Good Pt will go Supine/Side to Sit: Independently;with HOB 0 degrees PT Goal: Supine/Side to Sit - Progress: Goal set today Pt will go Sit to Stand: Independently PT Goal: Sit to Stand - Progress: Goal set today Pt will Ambulate: >150 feet;with modified independence PT Goal: Ambulate - Progress: Goal set today Pt will Go Up / Down Stairs: 1-2 stairs;with modified independence;with rail(s) PT Goal: Up/Down Stairs - Progress: Goal set today  Visit Information  Last PT Received On: 02/28/12 Assistance Needed: +1    Subjective Data  Subjective: I got up a couple times this morning. I feel weak.  Patient Stated Goal: return to 4 wheeling and boating   Prior Functioning  Home Living Lives With: Alone Home Access: Stairs to enter Entrance Stairs-Number of Steps: 2 Entrance Stairs-Rails: Can reach both;Left;Right Home Layout: One level Bathroom Toilet: Standard Home Adaptive Equipment: Walker - rolling;Shower chair without back Prior Function Level of Independence: Independent Able to Take Stairs?: Yes Driving: Yes Vocation: On disability Communication Communication: No difficulties Dominant Hand: Right    Cognition  Overall Cognitive Status: Appears within functional limits for tasks assessed/performed Arousal/Alertness: Awake/alert Orientation Level: Appears intact for tasks assessed Behavior During Session: [facility] for tasks performed    Extremity/Trunk Assessment Right Upper Extremity Assessment RUE ROM/Strength/Tone: [facility] for tasks assessed (per chart pt has C8 nerve compression) Left Upper Extremity Assessment LUE ROM/Strength/Tone: WFL for tasks assessed Right Lower Extremity Assessment RLE ROM/Strength/Tone: Within functional levels RLE Sensation: WFL - Light Touch RLE Coordination: WFL - gross/fine motor Left Lower Extremity Assessment LLE ROM/Strength/Tone: Within  functional levels LLE Sensation: WFL - Light Touch LLE Coordination: WFL - gross/fine motor Trunk Assessment  Trunk Assessment: Normal   Balance Balance Balance Assessed: Yes Static Sitting Balance Static Sitting - Balance Support: Right upper extremity supported;Feet supported Static Sitting - Level of Assistance: 7: Independent Static Sitting - Comment/# of Minutes: 2  End of Session PT - End of Session Activity Tolerance: Patient tolerated treatment well Patient left: in chair;with call bell/phone within reach Nurse Communication: Mobility status  GP     Ralene Bathe Kistler 02/28/2012, 9:14 AM  [phone]

## 2012-02-28 NOTE — Progress Notes (Signed)
OT Cancellation Note  Treatment cancelled today due to medical issues with patient which prohibited therapy - Pt is currently on bedrest due to dural tear.  Will proceed with eval tomorrow as indicated/ordered by MD.  Boykin Reaper [phone] 02/28/2012, 3:28 PM

## 2012-02-29 NOTE — Progress Notes (Signed)
PT Cancellation Note  Treatment cancelled today due to medical issues with patient which prohibited therapy.  Patient on strict bedrest through today.  Will return in am for PT treatment.  Vena Austria 02/29/2012, 1:34 PM [phone]

## 2012-02-29 NOTE — Progress Notes (Signed)
OT Cancellation Note  Treatment cancelled today due to medical issues with patient which prohibited therapy (bedrest).  [name] Pager: [phone]  02/29/2012, 8:19 AM

## 2012-02-29 NOTE — Progress Notes (Signed)
Pt is adamant about standing to void. I informed the Pt about the need for flat bedrest, and he stated the he is aware but can't void lying down.

## 2012-02-29 NOTE — Progress Notes (Signed)
Subjective: Patient reports sore in thighs.  Objective: Vital signs in last 24 hours: Temp:  [97.9 F (36.6 C)-99.7 F (37.6 C)] 99.7 F (37.6 C) (08/31 0600) Pulse Rate:  [74-92] 82  (08/31 0200) Resp:  [13-20] 20  (08/31 0600) BP: (95-108)/(52-79) 108/79 mmHg (08/31 0600) SpO2:  [94 %-100 %] 100 % (08/31 0600)  Intake/Output from previous day:   Intake/Output this shift:    Physical Exam: Dressing CDI. No headache.  Full strength both legs.  Lab Results: No results found for this basename: WBC:2,HGB:2,HCT:2,PLT:2 in the last 72 hours BMET No results found for this basename: NA:2,K:2,CL:2,CO2:2,GLUCOSE:2,BUN:2,CREATININE:2,CALCIUM:2 in the last 72 hours  Studies/Results: Dg Lumbar Spine 1 View  02/27/2012  *RADIOLOGY REPORT*  Clinical Data: Low back pain  LUMBAR SPINE - 1 VIEW  Comparison: 01/22/2012.  Findings: C-arm films document reposition of L2-L3 interbody fusion cage.  IMPRESSION: As above   Original Report Authenticated By: Elsie Stain, M.D.    Dg C-arm [phone] Min  02/27/2012  See accompanying dictation for lumbar spine one-view.   Original Report Authenticated By: Elsie Stain, M.D.     Assessment/Plan: Bedrest today and mobilize starting in am.  Will order bone growth stimulator to help stimulate fusion at L2/3 level.    LOS: 2 days    Dorian Heckle, MD 02/29/2012, 7:51 AM

## 2012-03-01 NOTE — Progress Notes (Signed)
Subjective: Patient reports doing better.  Back sore, thigh pain resolved.  No headache.  Objective: Vital signs in last 24 hours: Temp:  [98 F (36.7 C)-98.7 F (37.1 C)] 98.1 F (36.7 C) (09/01 0927) Pulse Rate:  [83-92] 86  (09/01 0927) Resp:  [12-20] 13  (09/01 0927) BP: (88-138)/(62-75) 135/75 mmHg (09/01 0927) SpO2:  [96 %-100 %] 100 % (09/01 0927) FiO2 (%):  [2 %] 2 % (09/01 0932)  Intake/Output from previous day: 08/31 0701 - 09/01 0700 In: 1590 [I.V.:1590] Out: 2350 [Urine:2350] Intake/Output this shift:    Physical Exam: Full strength.  Dressing CDI.    Lab Results: No results found for this basename: WBC:2,HGB:2,HCT:2,PLT:2 in the last 72 hours BMET No results found for this basename: NA:2,K:2,CL:2,CO2:2,GLUCOSE:2,BUN:2,CREATININE:2,CALCIUM:2 in the last 72 hours  Studies/Results: No results found.  Assessment/Plan: Mobilize with PT.  Will order bone growth stimulator.    LOS: 3 days    Dorian Heckle, MD 03/01/2012, 11:10 AM

## 2012-03-01 NOTE — Progress Notes (Signed)
PT Cancellation Note  Treatment cancelled today due to patient's refusal to participate. Attempted treatment this afternoon following bedrest orders being lifted. Pt stated that he just walked with nursing and does not want to get up again. Will attempt treatment tomorrow.   03/01/2012 Jason Yu DPT PAGER: [phone] OFFICE: [phone]    Jason Yu 03/01/2012, 2:24 PM

## 2012-03-01 NOTE — Progress Notes (Signed)
Occupational Therapy Evaluation Patient Details Name: Jason Yu MRN: [id number] DOB: [birth year] Today's Date: 03/01/2012 Time: 4098-1191 OT Time Calculation (min): 17 min  OT Assessment / Plan / Recommendation Clinical Impression  61 yo s/p lumbar decompression/ microdiscectomy. Pt @ mod I to Independent level with all ADL and mobility for ADL. Pt has all DME and AE needed. Appropriate for D/C home. No further services needed.    OT Assessment  Patient does not need any further OT services    Follow Up Recommendations  No OT follow up    Barriers to Discharge  none    Equipment Recommendations  None recommended by OT    Recommendations for Other Services  none  Frequency    eval only   Precautions / Restrictions Precautions Precautions: Back Required Braces or Orthoses: Spinal Brace Spinal Brace: Lumbar corset Restrictions Weight Bearing Restrictions: No   Pertinent Vitals/Pain none    ADL  ADL Comments: Pt @ Independent to mod I level with all ADL and mobility.  Reviewed back precautions.    OT Diagnosis:    OT Problem List:   OT Treatment Interventions:     OT Goals Acute Rehab OT Goals OT Goal Formulation:  (eval only)  Visit Information  Last OT Received On: 03/01/12    Subjective Data      Prior Functioning  Vision/Perception  Home Living Lives With: Alone Type of Home: House Home Access: Stairs to enter [employer] of Steps: 2 Entrance Stairs-Rails: Can reach both;Left;Right Home Layout: One level Bathroom Shower/Tub: Forensic scientist: Standard Bathroom Accessibility: Yes How Accessible: Accessible via walker Home Adaptive Equipment: Walker - rolling;Shower chair without back;Bedside commode/3-in-1 Prior Function Level of Independence: Independent Able to Take Stairs?: Yes Driving: Yes Vocation: On disability Communication Communication: No difficulties Dominant Hand: Right      Cognition         Extremity/Trunk Assessment Right Upper Extremity Assessment RUE ROM/Strength/Tone: WFL for tasks assessed Left Upper Extremity Assessment LUE ROM/Strength/Tone: WFL for tasks assessed Trunk Assessment Trunk Assessment: Normal   Mobility  Shoulder Instructions  Bed Mobility Bed Mobility: Supine to Sit;Sit to Supine Right Sidelying to Sit: 6: Modified independent (Device/Increase time) Supine to Sit: 6: Modified independent (Device/Increase time) Transfers Transfers: Sit to Stand;Stand to Sit Sit to Stand: 7: Independent;From bed;With upper extremity assist Stand to Sit: 7: Independent;With upper extremity assist;To bed Details for Transfer Assistance: very quick movements       Exercise     Balance Balance Balance Assessed:  (WFL for ADL)   End of Session OT - End of Session Activity Tolerance: Patient tolerated treatment well Patient left: in bed;with call bell/phone within reach  GO     [facility] 03/01/2012, 5:28 PM [facility], OTR/L  [phone] 03/01/2012

## 2012-03-01 NOTE — Progress Notes (Signed)
PT Cancellation Note  Treatment cancelled today due to medical issues with patient which prohibited therapy.  Pt continues to be on bedrest; Please advise when able to mobilize;  Van Clines [facility] 03/01/2012, 9:38 AM

## 2012-03-02 MED ORDER — CYCLOBENZAPRINE HCL 10 MG PO TABS: 10.0000 mg | ORAL_TABLET | Freq: Three times a day (TID) | ORAL | Status: AC | PRN

## 2012-03-02 MED ORDER — HYDROCODONE-ACETAMINOPHEN 10-325 MG PO TABS: 1.0000 | ORAL_TABLET | ORAL | Status: AC | PRN

## 2012-03-02 NOTE — Discharge Summary (Signed)
Physician Discharge Summary  Patient ID: DURIEL DEERY MRN: [id number] DOB/AGE: July 08, 1950 61 y.o.  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 *    Discharged Condition: good  Hospital Course: Underwent posterior decompression at L2/3, exploration of fusion and revision of arthrodesis  Consults: None  Significant Diagnostic Studies: None  Treatments: surgery: posterior decompression at L2/3, exploration of fusion and revision of arthrodesis   Discharge Exam: Blood pressure 145/68, pulse 107, temperature 98.9 F (37.2 C), temperature source Oral, resp. rate 20, height 5\' 5"  (1.651 m), weight 81.7 kg (180 lb 1.9 oz), SpO2 97.00%. Neurologic: Alert and oriented X 3, normal strength and tone. Normal symmetric reflexes. Normal coordination and gait Wound:CDI  Disposition: Home   Medication List  As of 03/02/2012 11:51 AM   TAKE these medications         albuterol 108 (90 BASE) MCG/ACT inhaler   Commonly known as: PROVENTIL HFA;VENTOLIN HFA   Inhale 2 puffs into the lungs every 6 (six) hours as needed. For shortness of breath      amLODipine 10 MG tablet   Commonly known as: NORVASC   Take 10 mg by mouth daily.      atorvastatin 20 MG tablet   Commonly known as: LIPITOR   Take 20 mg by mouth at bedtime.      cyclobenzaprine 10 MG tablet   Commonly known as: FLEXERIL   Take 1 tablet (10 mg total) by mouth 3 (three) times daily as needed for muscle spasms.      cyclobenzaprine 10 MG tablet   Commonly known as: FLEXERIL   Take 10 mg by mouth 3 (three) times daily as needed. 1 in am 2 in pm      desonide 0.05 % cream   Commonly known as: DESOWEN   Apply 1 application topically 2 (two) times daily as needed. For flare ups      diazepam 10 MG tablet   Commonly known  as: VALIUM   Take 10 mg by mouth every 8 (eight) hours as needed. For anxiety      Fish Oil 1000 MG Caps   Take 3 capsules by mouth daily.      Flax Seed Oil 1000 MG Caps   Take 1 capsule by mouth daily. 1300 mg      Glucosamine HCl 1000 MG Tabs   Take 2 tablets by mouth daily.      HYDROcodone-acetaminophen 10-325 MG per tablet   Commonly known as: NORCO   Take 1 tablet by mouth every 8 (eight) hours as needed. For pain      HYDROcodone-acetaminophen 10-325 MG per tablet   Commonly known as: NORCO   Take 1-2 tablets by mouth every 4 (four) hours as needed for pain.      ketoconazole 2 % cream   Commonly known as: NIZORAL   Apply 1 application topically daily. For mustache rash      labetalol 100 MG tablet   Commonly known as: NORMODYNE   Take 100 mg by mouth 2 (two) times daily.      LAMISIL 125 MG Pack   Generic drug: Terbinafine HCl   Take 1 each by mouth daily.      multivitamin tablet   Take 1 tablet by mouth daily.      omeprazole 20 MG capsule   Commonly known  as: PRILOSEC   Take 20 mg by mouth daily.      PARoxetine 10 MG tablet   Commonly known as: PAXIL   Take 5 mg by mouth daily.      potassium chloride 10 MEQ tablet   Commonly known as: K-DUR,KLOR-CON   Take 10 mEq by mouth 2 (two) times daily.      vitamin C 1000 MG tablet   Take 1,000 mg by mouth daily.             Signed: Dorian Heckle, MD 03/02/2012, 11:51 AM

## 2012-03-02 NOTE — Progress Notes (Signed)
Subjective: Patient reports doing well  Objective: Vital signs in last 24 hours: Temp:  [98.1 F (36.7 C)-100 F (37.8 C)] 99.2 F (37.3 C) (09/02 0600) Pulse Rate:  [86-104] 104  (09/02 0600) Resp:  [13-18] 18  (09/02 0600) BP: (95-155)/(67-78) 155/78 mmHg (09/02 0600) SpO2:  [92 %-100 %] 97 % (09/02 0600) FiO2 (%):  [2 %] 2 % (09/01 0932)  Intake/Output from previous day: 09/01 0701 - 09/02 0700 In: 690 [P.O.:240; I.V.:450] Out: 1300 [Urine:1300] Intake/Output this shift: Total I/O In: -  Out: 400 [Urine:400]  Physical Exam: No leg and improved back pain.  Standing up straighter.  Dressing CDI.  Lab Results: No results found for this basename: WBC:2,HGB:2,HCT:2,PLT:2 in the last 72 hours BMET No results found for this basename: NA:2,K:2,CL:2,CO2:2,GLUCOSE:2,BUN:2,CREATININE:2,CALCIUM:2 in the last 72 hours  Studies/Results: No results found.  Assessment/Plan: Discharge home.  Will get external bone growth stimulator as outpatient.  F/U 3 weeks in office.    LOS: 4 days    Dorian Heckle, MD 03/02/2012, 6:39 AM

## 2012-03-30 ENCOUNTER — Encounter (HOSPITAL_COMMUNITY): Payer: Self-pay

## 2012-07-02 ENCOUNTER — Other Ambulatory Visit: Payer: Self-pay | Admitting: Cardiology

## 2012-07-02 MED ORDER — ATORVASTATIN CALCIUM 20 MG PO TABS: 20.0000 mg | ORAL_TABLET | Freq: Every day | ORAL | Status: DC

## 2012-09-16 ENCOUNTER — Encounter: Payer: Medicaid Other | Admitting: Internal Medicine

## 2012-10-09 ENCOUNTER — Other Ambulatory Visit: Payer: Self-pay | Admitting: Internal Medicine

## 2012-10-09 ENCOUNTER — Ambulatory Visit (INDEPENDENT_AMBULATORY_CARE_PROVIDER_SITE_OTHER): Payer: Medicaid Other | Admitting: *Deleted

## 2012-10-09 DIAGNOSIS — I442 Atrioventricular block, complete: Secondary | ICD-10-CM

## 2012-10-09 LAB — PACEMAKER DEVICE OBSERVATION
AL AMPLITUDE: 4 mv
AL THRESHOLD: 0.75 V
BATTERY VOLTAGE: 2.8 V
RV LEAD IMPEDENCE PM: 537 Ohm
VENTRICULAR PACING PM: 100

## 2012-10-09 NOTE — Progress Notes (Signed)
Pacemaker check in clinic. Normal device function. Battery longevity 11 years. No episodes since last check. ROV in 6 mths with JA in [place] office.

## 2012-10-29 ENCOUNTER — Encounter: Payer: Self-pay | Admitting: Internal Medicine

## 2013-01-20 ENCOUNTER — Other Ambulatory Visit (HOSPITAL_COMMUNITY): Payer: Self-pay | Admitting: Neurosurgery

## 2013-01-20 ENCOUNTER — Other Ambulatory Visit: Payer: Self-pay | Admitting: Neurosurgery

## 2013-01-20 DIAGNOSIS — IMO0002 Reserved for concepts with insufficient information to code with codable children: Secondary | ICD-10-CM

## 2013-01-20 DIAGNOSIS — M5030 Other cervical disc degeneration, unspecified cervical region: Secondary | ICD-10-CM

## 2013-01-20 DIAGNOSIS — M545 Low back pain: Secondary | ICD-10-CM

## 2013-02-17 ENCOUNTER — Encounter (HOSPITAL_COMMUNITY): Payer: Self-pay | Admitting: Pharmacy Technician

## 2013-02-25 ENCOUNTER — Ambulatory Visit (HOSPITAL_COMMUNITY): Payer: Medicaid Other

## 2013-02-25 ENCOUNTER — Inpatient Hospital Stay (HOSPITAL_COMMUNITY): Admission: RE | Admit: 2013-02-25 | Payer: Medicaid Other | Source: Ambulatory Visit

## 2013-03-12 ENCOUNTER — Ambulatory Visit (HOSPITAL_COMMUNITY)
Admission: RE | Admit: 2013-03-12 | Discharge: 2013-03-12 | Disposition: A | Discharge status: Home / Self Care | Payer: Medicaid Other | Source: Ambulatory Visit | Attending: Neurosurgery | Admitting: Neurosurgery

## 2013-03-12 DIAGNOSIS — M5124 Other intervertebral disc displacement, thoracic region: Secondary | ICD-10-CM | POA: Insufficient documentation

## 2013-03-12 DIAGNOSIS — M519 Unspecified thoracic, thoracolumbar and lumbosacral intervertebral disc disorder: Secondary | ICD-10-CM | POA: Insufficient documentation

## 2013-03-12 DIAGNOSIS — M545 Low back pain, unspecified: Secondary | ICD-10-CM

## 2013-03-12 DIAGNOSIS — IMO0002 Reserved for concepts with insufficient information to code with codable children: Secondary | ICD-10-CM | POA: Insufficient documentation

## 2013-03-12 DIAGNOSIS — M5030 Other cervical disc degeneration, unspecified cervical region: Secondary | ICD-10-CM

## 2013-03-12 DIAGNOSIS — M47812 Spondylosis without myelopathy or radiculopathy, cervical region: Secondary | ICD-10-CM | POA: Insufficient documentation

## 2013-03-12 MED ORDER — DIAZEPAM 5 MG PO TABS: 10.0000 mg | ORAL_TABLET | Freq: Once | ORAL | Status: DC

## 2013-03-12 MED ORDER — ONDANSETRON HCL 4 MG/2ML IJ SOLN: 4.0000 mg | Freq: Four times a day (QID) | INTRAMUSCULAR | Status: DC | PRN

## 2013-03-12 MED ORDER — IOHEXOL 300 MG/ML  SOLN: 10.0000 mL | Freq: Once | INTRAMUSCULAR | Status: DC | PRN

## 2013-03-12 NOTE — Progress Notes (Signed)
Pt took home dose of valium at 0830 and home dose hydrocodone 10mg  at 0900.

## 2013-03-15 ENCOUNTER — Encounter: Payer: Self-pay | Admitting: Cardiology

## 2013-03-15 ENCOUNTER — Ambulatory Visit (INDEPENDENT_AMBULATORY_CARE_PROVIDER_SITE_OTHER): Payer: Medicaid Other | Admitting: Cardiology

## 2013-03-15 VITALS — BP 162/84 | HR 69 | Ht 65.0 in | Wt 166.4 lb

## 2013-03-15 DIAGNOSIS — Z79899 Other long term (current) drug therapy: Secondary | ICD-10-CM

## 2013-03-15 DIAGNOSIS — I10 Essential (primary) hypertension: Secondary | ICD-10-CM

## 2013-03-15 DIAGNOSIS — E785 Hyperlipidemia, unspecified: Secondary | ICD-10-CM

## 2013-03-15 DIAGNOSIS — I442 Atrioventricular block, complete: Secondary | ICD-10-CM

## 2013-03-15 MED ORDER — HYDROCHLOROTHIAZIDE 25 MG PO TABS: 25.0000 mg | ORAL_TABLET | Freq: Every day | ORAL | Status: DC

## 2013-03-15 NOTE — Progress Notes (Signed)
Clinical Summary Jason Yu is a 62 y.o.male 1. Complete heart block - s/p pacemaker placement 2004 - normal device check 09/2012 - denies any significant lightheadedness or dizziness, no palps, no syncope  2. HTN - reports some occasonal low bps in the evenings 90/60s, typically normal or elevated in evening - checks bp in mornings, typically 150/80 and often in the evenings 90/60. Now only taking labetalol once a day.    3. HL - compliant w/ statin, followed by Jason Yu   Past Medical History  Diagnosis Date  . Complete heart block     with prior syncope s/p PPM 2004 in [place] (MDT Kappa);  08/16/2011 PPM Gen. Change: MDT Adapta L ADDDR1, [id number] H.  . HTN (hypertension), benign   . Syncope     a.  2001 - normal cath;  b. 2012 Normal Myoview, EF 58%.  Marland Kitchen Anxiety   . HLD (hyperlipidemia)   . DDD (degenerative disc disease)     with multiple prior back surgeries   . Complication of anesthesia 05/2011    had difficulty breathing after surgery=C8 nerve root compression  . Asthma     hasn't used inhaler at least 4-5 months.  . Pneumonia last 1998    has had 8x  . Hepatitis     Hx Hepatitis A & B  . GERD (gastroesophageal reflux disease)   . PONV (postoperative nausea and vomiting)   . Pacemaker   . Shortness of breath      Allergies  Allergen Reactions  . Other Other (See Comments)    Nuclear Stress Medicine- made him feel" out of touch" w/ reality.  . Prednisone Other (See Comments)    REACTION: pain in joints  . Codeine Rash    REACTION: rash,itch     Current Outpatient Prescriptions  Medication Sig Dispense Refill  . albuterol (PROVENTIL HFA;VENTOLIN HFA) 108 (90 BASE) MCG/ACT inhaler Inhale 2 puffs into the lungs every 6 (six) hours as needed. For shortness of breath      . amLODipine (NORVASC) 10 MG tablet Take 5 mg by mouth daily.       . Ascorbic Acid (VITAMIN C) 1000 MG tablet Take 1,000 mg by mouth daily.        . Cholecalciferol (VITAMIN D) 2000 UNITS  CAPS Take 1 capsule by mouth daily.      Marland Kitchen desonide (DESOWEN) 0.05 % cream Apply 1 application topically 2 (two) times daily as needed. For flare ups      . diazepam (VALIUM) 10 MG tablet Take 10 mg by mouth every 8 (eight) hours as needed. For anxiety      . Flaxseed, Linseed, (FLAX SEED OIL) 1000 MG CAPS Take 2 capsules by mouth daily.      Marland Kitchen HYDROcodone-acetaminophen (NORCO) 10-325 MG per tablet Take 1 tablet by mouth every 6 (six) hours as needed. For pain      . HYDROmorphone (DILAUDID) 4 MG tablet Take 4 mg by mouth every 6 (six) hours as needed for pain.      Marland Kitchen ketoconazole (NIZORAL) 2 % cream Apply 1 application topically daily. For mustache rash      . labetalol (NORMODYNE) 100 MG tablet Take 100 mg by mouth 2 (two) times daily.      . Multiple Vitamin (MULTIVITAMIN) tablet Take 1 tablet by mouth daily.        . Omega-3 Fatty Acids (FISH OIL) 1000 MG CAPS Take 2 capsules by mouth daily.      Marland Kitchen PARoxetine (  PAXIL) 20 MG tablet Take 10 mg by mouth daily.      . potassium chloride (K-DUR,KLOR-CON) 10 MEQ tablet Take 10 mEq by mouth daily.        No current facility-administered medications for this visit.     Past Surgical History  Procedure Laterality Date  . Cervical neck surgeries    . Pacemaker insertion      Implanted 2004 in [place]  . Cardiac catheterization  2001    no significant CAD  . Insert / replace / remove pacemaker  08/2011    initially inserted 12/2002  . Shoulder arthroscopy distal clavicle excision and open rotator cuff repair  2009    right  . Shoulder arthroscopy distal clavicle excision and open rotator cuff repair  2010    Left  . Carpal tunnel release  1979  . Ulnar tunnel release  1980's(late) or early 1990's  . Pacemaker generator change  08/16/11    Medtronic Adapta L implanted by Dr Johney Frame, lead implangted 2004 in [place]  . Back surgery  08/2009, 09/2011     Allergies  Allergen Reactions  . Other Other (See Comments)    Nuclear Stress Medicine-  made him feel" out of touch" w/ reality.  . Prednisone Other (See Comments)    REACTION: pain in joints  . Codeine Rash    REACTION: rash,itch      Family History  Problem Relation Age of Onset  . Diabetes Neg Hx   . Hypertension Neg Hx   . Coronary artery disease Neg Hx      Social History Jason Yu reports that he quit smoking about 35 years ago. His smoking use included Cigarettes. He has a 6 pack-year smoking history. He has never used smokeless tobacco. Jason Yu reports that he does not drink alcohol.   Review of Systems 12 point ROS negative other than reported in HPI  Physical Examination p 69 bp 162/84 Gen: resting comfortably, NAD HEENT: no scleral icterus, pupils equal round and reactive, no palptable cervical adenopathy CV Pulm: CTAB Abd: soft, NT, ND NABS, no hepatosplenomegaly Ext: warm, no edema.  Skin: warm, no rash Neuro: A&Ox3, no focal deficits      Assessment and Plan  1. Complete heart block - previous pacemaker placement, normal function on last check in April 2014. No current symptoms - continue routine pacemaker checks  2. HTN - seems to be fluctuating at home, he is only taking his labetalol once at home because of it. Appears bp was previously fairly difficult to manage, and he was switched from metoprolol to labetalol. - stop labetalol, start HCTZ 25 daily, check bmp prior to next visit - patient is to maintain bp log   3. HL: no recent lipid panel - continue statin therapy - check lipids prior to next visit  F/u 6 weeks  Jason Yu, M.D., F.A.C.C.

## 2013-03-15 NOTE — Addendum Note (Signed)
Addended by: Dina Rich F on: 03/15/2013 12:33 PM   Modules accepted: Orders

## 2013-03-15 NOTE — Patient Instructions (Addendum)
Your physician recommends that you schedule a follow-up appointment in: 6 weeks with Dr. Wyline Mood. This appointment will be scheduled today.  Your physician has recommended you make the following change in your medication:  Stop Labetolol 100 MG Start Hydrochlorothiazide 25 MG once daily. A prescription has been sent to your pharmacy.  Continue all other medicines the same.   Your physician recommends that you return for lab work in: Tomorrow for fasting Lipid and BNP.

## 2013-03-18 ENCOUNTER — Telehealth: Payer: Self-pay | Admitting: Cardiology

## 2013-03-18 MED ORDER — ATORVASTATIN CALCIUM 40 MG PO TABS: 40.0000 mg | ORAL_TABLET | Freq: Every day | ORAL | Status: DC

## 2013-03-18 NOTE — Telephone Encounter (Signed)
Message copied by Burnice Logan on Thu Mar 18, 2013 11:48 AM ------      Message from: [place] F      Created: Wed Mar 17, 2013  7:19 PM       Please call Mr. Spellman and let him know his cholesterol is very high, his LDL is 200 and should be about half that. Can you please changes his atorva to 40mg  daily and send in electronically. ------

## 2013-03-18 NOTE — Telephone Encounter (Signed)
Called and informed pt of results. Pt is questioning if he should increase the atorvastatin because he says six months ago it was 600 and a month ago it was 250. He is also concerned about his liver because he has had hepatitis in the past. I informed pt that we would monitor the liver by blood work and if it became a problem then the medicine would be changed or decreased. Pt agreed to the increase of medicine. He also asked about the side effects of the higher dose. I informed him that he could ask his pharmacist about the side effects.

## 2013-03-23 NOTE — Procedures (Signed)
Lumbar puncture with omnipaque 300 6 cc well tolerated by patient.  No complications.

## 2013-03-24 ENCOUNTER — Ambulatory Visit: Payer: Medicaid Other | Admitting: Cardiology

## 2013-03-26 ENCOUNTER — Telehealth: Payer: Self-pay | Admitting: Cardiology

## 2013-03-26 NOTE — Telephone Encounter (Signed)
Pt informed

## 2013-03-26 NOTE — Telephone Encounter (Signed)
Message copied by Burnice Logan on Fri Mar 26, 2013  3:57 PM ------      Message from: Dina Rich F      Created: Fri Mar 26, 2013  9:58 AM       Please let patient know his blood chemistry is normal ------

## 2013-04-12 ENCOUNTER — Encounter: Payer: Self-pay | Admitting: Internal Medicine

## 2013-04-12 ENCOUNTER — Ambulatory Visit (INDEPENDENT_AMBULATORY_CARE_PROVIDER_SITE_OTHER): Payer: Medicaid Other | Admitting: Internal Medicine

## 2013-04-12 VITALS — BP 142/74 | HR 79 | Ht 65.0 in | Wt 170.0 lb

## 2013-04-12 DIAGNOSIS — Z950 Presence of cardiac pacemaker: Secondary | ICD-10-CM

## 2013-04-12 DIAGNOSIS — I442 Atrioventricular block, complete: Secondary | ICD-10-CM

## 2013-04-12 DIAGNOSIS — I10 Essential (primary) hypertension: Secondary | ICD-10-CM

## 2013-04-12 LAB — PACEMAKER DEVICE OBSERVATION
AL IMPEDENCE PM: 427 Ohm
ATRIAL PACING PM: 18
BATTERY VOLTAGE: 2.79 V
RV LEAD IMPEDENCE PM: 537 Ohm
VENTRICULAR PACING PM: 100

## 2013-04-12 NOTE — Patient Instructions (Signed)
Continue all current medications. Continue [facility] remote checks - next 07/16/2013 Your physician wants you to follow up in:  1 year.  You will receive a reminder letter in the mail one-two months in advance.  If you don't receive a letter, please call our office to schedule the follow up appointment - Dr. Johney Frame

## 2013-04-12 NOTE — Progress Notes (Signed)
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  Since his last office visit, the patient reports doing very well.  He continues to struggle with chronic pain.  Though he does not smoke tobacco, he continues to use marijuana. Today, he denies symptoms of palpitations, chest pain, shortness of breath,  lower extremity edema, dizziness, presyncope, or syncope.  The patient is otherwise without complaint today.   Past Medical History  Diagnosis Date  . Complete heart block     with prior syncope s/p PPM 2004 in [place] (MDT [place]);  08/16/2011 PPM Gen. Change: MDT Adapta L ADDDR1, [id number] H.  . HTN (hypertension), benign   . Syncope     a.  2001 - normal cath;  b. 2012 Normal Myoview, EF 58%.  Marland Kitchen Anxiety   . HLD (hyperlipidemia)   . DDD (degenerative disc disease)     with multiple prior back surgeries   . Complication of anesthesia 05/2011    had difficulty breathing after surgery=C8 nerve root compression  . Asthma     hasn't used inhaler at least 4-5 months.  . Pneumonia last 1998    has had 8x  . Hepatitis     Hx Hepatitis A & B  . GERD (gastroesophageal reflux disease)   . PONV (postoperative nausea and vomiting)   . Pacemaker   . Shortness of breath    Past Surgical History  Procedure Laterality Date  . Cervical neck surgeries    . Pacemaker insertion      Implanted 2004 in [place]  . Cardiac catheterization  2001    no significant CAD  . Insert / replace / remove pacemaker  08/2011    initially inserted 12/2002  . Shoulder arthroscopy distal clavicle excision and open rotator cuff repair  2009    right  . Shoulder arthroscopy distal clavicle excision and open rotator cuff repair  2010    Left  . Carpal tunnel release  1979  . Ulnar tunnel release  1980's(late) or early 1990's  . Pacemaker generator change  08/16/11    Medtronic Adapta L implanted by Dr Johney Frame, lead  implangted 2004 in [place]  . Back surgery  08/2009, 09/2011    Current Outpatient Prescriptions  Medication Sig Dispense Refill  . albuterol (PROVENTIL HFA;VENTOLIN HFA) 108 (90 BASE) MCG/ACT inhaler Inhale 2 puffs into the lungs every 6 (six) hours as needed. For shortness of breath      . amLODipine (NORVASC) 10 MG tablet Take 5 mg by mouth daily.       . Ascorbic Acid (VITAMIN C) 1000 MG tablet Take 1,000 mg by mouth daily.        Marland Kitchen atorvastatin (LIPITOR) 40 MG tablet Take 1 tablet (40 mg total) by mouth daily.  30 tablet  3  . Cholecalciferol (VITAMIN D) 2000 UNITS CAPS Take 1 capsule by mouth daily.      Marland Kitchen desonide (DESOWEN) 0.05 % cream Apply 1 application topically 2 (two) times daily as needed. For flare ups      . diazepam (VALIUM) 10 MG tablet Take 10 mg by mouth every 8 (eight) hours as needed. For anxiety      . hydrochlorothiazide (HYDRODIURIL) 25 MG tablet Take 1 tablet (25 mg total) by mouth daily.  30 tablet  6  . HYDROcodone-acetaminophen (NORCO) 10-325 MG per tablet Take 1 tablet by mouth every 6 (six) hours as needed. For  pain      . HYDROmorphone (DILAUDID) 4 MG tablet Take 4 mg by mouth every 6 (six) hours as needed for pain.      . Multiple Vitamin (MULTIVITAMIN) tablet Take 1 tablet by mouth daily.        Marland Kitchen omeprazole (PRILOSEC) 20 MG capsule Take 20 mg by mouth daily.      Marland Kitchen PARoxetine (PAXIL) 20 MG tablet Take 10 mg by mouth daily.      . potassium chloride (K-DUR,KLOR-CON) 10 MEQ tablet Take 10 mEq by mouth 2 (two) times daily.        No current facility-administered medications for this visit.    Physical Exam: Filed Vitals:   04/12/13 0951  BP: 142/74  Pulse: 79  Height: 5\' 5"  (1.651 m)  Weight: 170 lb (77.111 kg)    GEN- The patient is well appearing, alert and oriented x 3 today.   Head- normocephalic, atraumatic Eyes-  Sclera clear, conjunctiva pink Ears- hearing intact Oropharynx- clear Lungs- Clear to ausculation bilaterally, normal work of  breathing Chest- pacemaker pocket is well healed Heart- Regular rate and rhythm, no murmurs, rubs or gallops, PMI not laterally displaced GI- soft, NT, ND, + BS Extremities- no clubbing, cyanosis, or edema MS- wearing a back brace today and walking slowly  Pacemaker interrogation- reviewed in detail today,  See PACEART report  Assessment and Plan:  1. Complete heart block Normal pacemaker function See Pace Art report No changes today  2. HTN Improving with recent changes by Dr Wyline Mood  3. Marijuana Cessation advised  [facility] every 3 months I will see again in 1 year

## 2013-04-26 ENCOUNTER — Encounter: Payer: Self-pay | Admitting: Cardiology

## 2013-04-26 ENCOUNTER — Ambulatory Visit (INDEPENDENT_AMBULATORY_CARE_PROVIDER_SITE_OTHER): Payer: Medicaid Other | Admitting: Cardiology

## 2013-04-26 VITALS — BP 116/70 | HR 66 | Ht 65.0 in | Wt 170.1 lb

## 2013-04-26 DIAGNOSIS — I10 Essential (primary) hypertension: Secondary | ICD-10-CM

## 2013-04-26 NOTE — Progress Notes (Signed)
Clinical Summary Mr. Quillin is a 62 y.o.male seen today for follow up.    1. Complete heart block  - s/p pacemaker placement 2004  - normal device check 03/2013 - denies any significant lightheadedness or dizziness, no palps, no syncope     2. HTN  - last visit reported some labile blood pressures at home  - stopped labetalol last visit, started on HCTZ 25 mg daily.  - checks bp twice a day. Typically 110-120s/60s. Rechecked labs K 4.5 and Cr 0.85   3. HL  - compliant w/ statin, followed by PC - recent panel 03/2013: TC 290 HDL 48 LDL 205 TG 184  Past Medical History  Diagnosis Date  . Complete heart block     with prior syncope s/p PPM 2004 in [place] (MDT Kappa);  08/16/2011 PPM Gen. Change: MDT Adapta L ADDDR1, [id number] H.  . HTN (hypertension), benign   . Syncope     a.  2001 - normal cath;  b. 2012 Normal Myoview, EF 58%.  Marland Kitchen Anxiety   . HLD (hyperlipidemia)   . DDD (degenerative disc disease)     with multiple prior back surgeries   . Complication of anesthesia 05/2011    had difficulty breathing after surgery=C8 nerve root compression  . Asthma     hasn't used inhaler at least 4-5 months.  . Pneumonia last 1998    has had 8x  . Hepatitis     Hx Hepatitis A & B  . GERD (gastroesophageal reflux disease)   . PONV (postoperative nausea and vomiting)   . Pacemaker   . Shortness of breath      Allergies  Allergen Reactions  . Other Other (See Comments)    Nuclear Stress Medicine- made him feel" out of touch" w/ reality.  . Prednisone Other (See Comments)    REACTION: pain in joints  . Codeine Rash    REACTION: rash,itch     Current Outpatient Prescriptions  Medication Sig Dispense Refill  . albuterol (PROVENTIL HFA;VENTOLIN HFA) 108 (90 BASE) MCG/ACT inhaler Inhale 2 puffs into the lungs every 6 (six) hours as needed. For shortness of breath      . amLODipine (NORVASC) 10 MG tablet Take 5 mg by mouth daily.       . Ascorbic Acid (VITAMIN C) 1000 MG  tablet Take 1,000 mg by mouth daily.        Marland Kitchen atorvastatin (LIPITOR) 40 MG tablet Take 1 tablet (40 mg total) by mouth daily.  30 tablet  3  . Cholecalciferol (VITAMIN D) 2000 UNITS CAPS Take 1 capsule by mouth daily.      Marland Kitchen desonide (DESOWEN) 0.05 % cream Apply 1 application topically 2 (two) times daily as needed. For flare ups      . diazepam (VALIUM) 10 MG tablet Take 10 mg by mouth every 8 (eight) hours as needed. For anxiety      . hydrochlorothiazide (HYDRODIURIL) 25 MG tablet Take 1 tablet (25 mg total) by mouth daily.  30 tablet  6  . HYDROcodone-acetaminophen (NORCO) 10-325 MG per tablet Take 1 tablet by mouth every 6 (six) hours as needed. For pain      . HYDROmorphone (DILAUDID) 4 MG tablet Take 4 mg by mouth every 6 (six) hours as needed for pain.      . Multiple Vitamin (MULTIVITAMIN) tablet Take 1 tablet by mouth daily.        Marland Kitchen omeprazole (PRILOSEC) 20 MG capsule Take 20 mg by  mouth daily.      Marland Kitchen PARoxetine (PAXIL) 20 MG tablet Take 10 mg by mouth daily.      . potassium chloride (K-DUR,KLOR-CON) 10 MEQ tablet Take 10 mEq by mouth 2 (two) times daily.        No current facility-administered medications for this visit.     Past Surgical History  Procedure Laterality Date  . Cervical neck surgeries    . Pacemaker insertion      Implanted 2004 in [place]  . Cardiac catheterization  2001    no significant CAD  . Insert / replace / remove pacemaker  08/2011    initially inserted 12/2002  . Shoulder arthroscopy distal clavicle excision and open rotator cuff repair  2009    right  . Shoulder arthroscopy distal clavicle excision and open rotator cuff repair  2010    Left  . Carpal tunnel release  1979  . Ulnar tunnel release  1980's(late) or early 1990's  . Pacemaker generator change  08/16/11    Medtronic Adapta L implanted by Dr Johney Frame, lead implangted 2004 in [place]  . Back surgery  08/2009, 09/2011     Allergies  Allergen Reactions  . Other Other (See Comments)     Nuclear Stress Medicine- made him feel" out of touch" w/ reality.  . Prednisone Other (See Comments)    REACTION: pain in joints  . Codeine Rash    REACTION: rash,itch      Family History  Problem Relation Age of Onset  . Diabetes Neg Hx   . Hypertension Neg Hx   . Coronary artery disease Neg Hx      Social History Mr. Hillier reports that he quit smoking about 35 years ago. His smoking use included Cigarettes. He has a 6 pack-year smoking history. He has never used smokeless tobacco. Mr. Vahle reports that he does not drink alcohol.   Review of Systems CONSTITUTIONAL: No weight loss, fever, chills, weakness or fatigue.  HEENT: Eyes: No visual loss, blurred vision, double vision or yellow sclerae.No hearing loss, sneezing, congestion, runny nose or sore throat.  SKIN: No rash or itching.  CARDIOVASCULAR: per HPI RESPIRATORY: No shortness of breath, cough or sputum.  GASTROINTESTINAL: No anorexia, nausea, vomiting or diarrhea. No abdominal pain or blood.  GENITOURINARY: No burning on urination, no polyuria NEUROLOGICAL: No headache, dizziness, syncope, paralysis, ataxia, numbness or tingling in the extremities. No change in bowel or bladder control.  MUSCULOSKELETAL:chronic back pain LYMPHATICS: No enlarged nodes. No history of splenectomy.  PSYCHIATRIC: No history of depression or anxiety.  ENDOCRINOLOGIC: No reports of sweating, cold or heat intolerance. No polyuria or polydipsia.  Marland Kitchen   Physical Examination p 66 bp 116/70 Wt 170 lbs BMI28 Gen: resting comfortably, no acute distress HEENT: no scleral icterus, pupils equal round and reactive, no palptable cervical adenopathy,  CV: RRR, no m/r/g,no JVD, no carotid bruits Resp: Clear to auscultation bilaterally GI: abdomen is soft, non-tender, non-distended, normal bowel sounds, no hepatosplenomegaly MSK: extremities are warm, no edema.  Skin: warm, no rash Neuro:  no focal deficits Psych: appropriate  affect      Assessment and Plan  1. Complete heart block  - previous pacemaker placement, normal function on last check in Oct 2014. No current symptoms  - continue routine pacemaker checks   2. HTN  - stabilized and at goal since stopping labetalol and starting HCTZ - continue current meds   3. HL: elevated lipids on last check - approx 1 month ago  his lipitor was doubled to 40mg  daily - repeat panel in a few months to see if at goal after previous change.        Antoine Poche, M.D., F.A.C.C.

## 2013-04-26 NOTE — Patient Instructions (Signed)
Your physician recommends that you schedule a follow-up appointment in: 6 months with Dr. Wyline Mood. You should receive a letter in the mail in 4 months. If you do not receive this letter by February call our office to schedule this appointment.   Your physician recommends that you continue on your current medications as directed. Please refer to the Current Medication list given to you today.

## 2013-05-06 ENCOUNTER — Other Ambulatory Visit: Payer: Self-pay

## 2013-07-16 ENCOUNTER — Ambulatory Visit (INDEPENDENT_AMBULATORY_CARE_PROVIDER_SITE_OTHER): Payer: Medicaid Other | Admitting: *Deleted

## 2013-07-16 DIAGNOSIS — I442 Atrioventricular block, complete: Secondary | ICD-10-CM

## 2013-07-16 LAB — MDC_IDC_ENUM_SESS_TYPE_REMOTE
Brady Statistic AP VP Percent: 11 %
Brady Statistic AP VS Percent: 0 %
Brady Statistic AS VS Percent: 0 %
Date Time Interrogation Session: 20150116174916
Lead Channel Impedance Value: 537 Ohm
Lead Channel Pacing Threshold Amplitude: 0.75 V
Lead Channel Pacing Threshold Pulse Width: 0.4 ms
Lead Channel Pacing Threshold Pulse Width: 0.4 ms
Lead Channel Setting Sensing Sensitivity: 5.6 mV
MDC IDC MSMT BATTERY IMPEDANCE: 136 Ohm
MDC IDC MSMT BATTERY REMAINING LONGEVITY: 125 mo
MDC IDC MSMT BATTERY VOLTAGE: 2.8 V
MDC IDC MSMT LEADCHNL RA IMPEDANCE VALUE: 416 Ohm
MDC IDC MSMT LEADCHNL RA SENSING INTR AMPL: 2.8 mV
MDC IDC MSMT LEADCHNL RV PACING THRESHOLD AMPLITUDE: 1.125 V
MDC IDC SET LEADCHNL RA PACING AMPLITUDE: 2 V
MDC IDC SET LEADCHNL RV PACING AMPLITUDE: 2.5 V
MDC IDC SET LEADCHNL RV PACING PULSEWIDTH: 0.4 ms
MDC IDC STAT BRADY AS VP PERCENT: 89 %

## 2013-07-20 ENCOUNTER — Encounter: Payer: Self-pay | Admitting: Internal Medicine

## 2013-07-21 ENCOUNTER — Encounter: Payer: Self-pay | Admitting: Internal Medicine

## 2013-07-27 ENCOUNTER — Encounter: Payer: Self-pay | Admitting: *Deleted

## 2013-07-30 ENCOUNTER — Encounter: Payer: Self-pay | Admitting: Internal Medicine

## 2013-08-29 ENCOUNTER — Other Ambulatory Visit: Payer: Self-pay | Admitting: Physician Assistant

## 2013-08-30 MED ORDER — ATORVASTATIN CALCIUM 40 MG PO TABS: 40.0000 mg | ORAL_TABLET | Freq: Every day | ORAL | Status: DC

## 2013-10-18 ENCOUNTER — Ambulatory Visit (INDEPENDENT_AMBULATORY_CARE_PROVIDER_SITE_OTHER): Payer: Medicaid Other | Admitting: *Deleted

## 2013-10-18 DIAGNOSIS — I442 Atrioventricular block, complete: Secondary | ICD-10-CM

## 2013-10-19 ENCOUNTER — Encounter: Payer: Self-pay | Admitting: Internal Medicine

## 2013-10-19 LAB — MDC_IDC_ENUM_SESS_TYPE_REMOTE
Battery Remaining Longevity: 120 mo
Battery Voltage: 2.79 V
Brady Statistic AP VS Percent: 0.1 %
Brady Statistic AS VP Percent: 87.5 %
Brady Statistic AS VS Percent: 0.1 %
Lead Channel Impedance Value: 426 Ohm
Lead Channel Pacing Threshold Amplitude: 0.625 V
Lead Channel Sensing Intrinsic Amplitude: 2.8 mV
Lead Channel Sensing Intrinsic Amplitude: 5.6 mV
Lead Channel Setting Pacing Amplitude: 2 V
Lead Channel Setting Pacing Pulse Width: 0.4 ms
MDC IDC MSMT LEADCHNL RA PACING THRESHOLD PULSEWIDTH: 0.4 ms
MDC IDC MSMT LEADCHNL RV IMPEDANCE VALUE: 557 Ohm
MDC IDC MSMT LEADCHNL RV PACING THRESHOLD AMPLITUDE: 1 V
MDC IDC MSMT LEADCHNL RV PACING THRESHOLD PULSEWIDTH: 0.4 ms
MDC IDC SET LEADCHNL RV PACING AMPLITUDE: 2.5 V
MDC IDC SET LEADCHNL RV SENSING SENSITIVITY: 5.6 mV
MDC IDC STAT BRADY AP VP PERCENT: 12.5 %

## 2013-10-26 ENCOUNTER — Encounter: Payer: Self-pay | Admitting: *Deleted

## 2013-10-28 ENCOUNTER — Encounter: Payer: Self-pay | Admitting: Internal Medicine

## 2013-11-01 NOTE — Progress Notes (Signed)
PPM remote 

## 2013-11-02 ENCOUNTER — Other Ambulatory Visit: Payer: Self-pay | Admitting: *Deleted

## 2013-11-02 MED ORDER — HYDROCHLOROTHIAZIDE 25 MG PO TABS: 25.0000 mg | ORAL_TABLET | Freq: Every day | ORAL | Status: DC

## 2013-11-06 IMAGING — CT CT L SPINE W/ CM
4 of 9 series · 10 of 35 positions shown, 11 images · non-contrast
Comparison: 11/15/2010.

CLINICAL DATA: Left lower back pain and leg pain.

LUMBAR MYELOGRAM AND POST MYELOGRAM CT
MYELOGRAM LUMBAR
TECHNIQUE: Contrast was injected by Dr.Sisa at the L1-2. level.
Contrast was negotiated into the lumbar spine without difficulty.
Fluoroscopy time:  1.12 minutes
TECHNIQUE: Multidetector CT imaging of the lumbar spine was
performed following myelography.  Multiplanar CT image
reconstructions were also generated.

[Series 2: l-spine · axial · 0.27mm/px · z∈[-189,-84]mm · 2 of 128 slices shown, 3 images]
[im 43/128  soft-tissue]
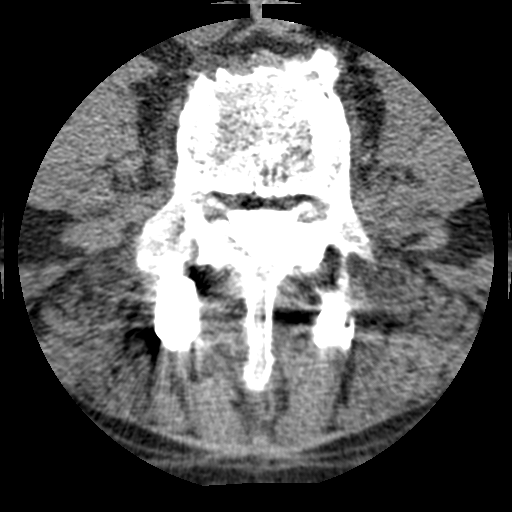
[im 43/128  bone]
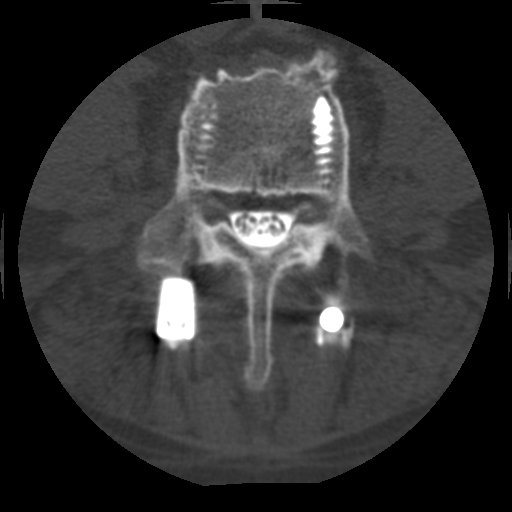
[im 85/128  bone]
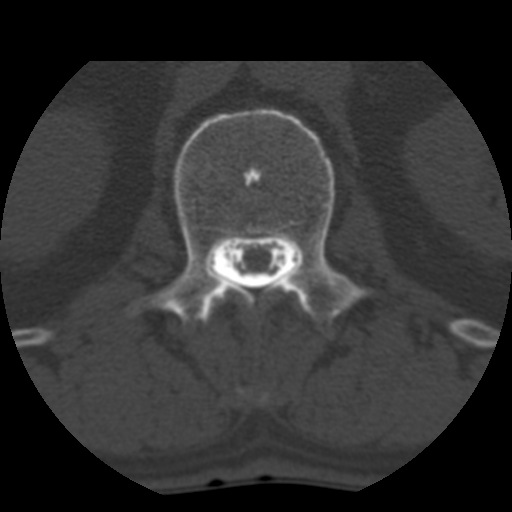

[Series 3: recon 2: l-spine · axial · 0.27mm/px · z∈[-189,-84]mm · 2 of 128 slices shown]
[im 43/128  bone]
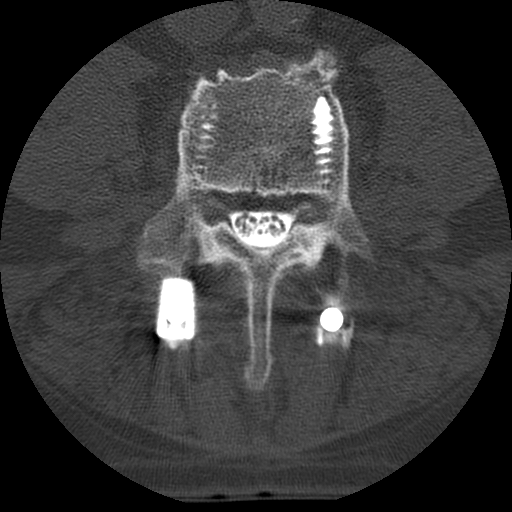
[im 85/128  bone]
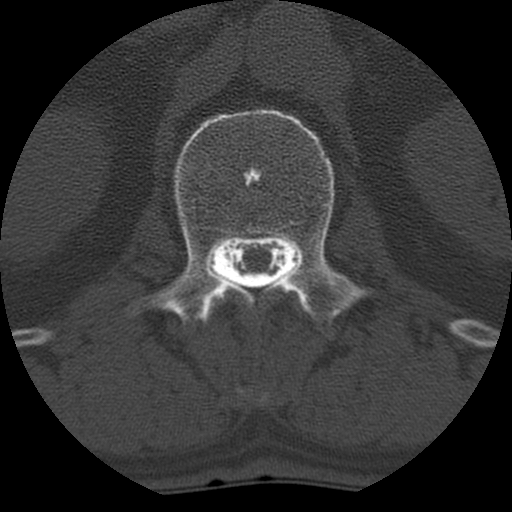

[Series 104: sag st · sagittal · 0.64mm/px · 3 of 40 slices shown]
[im 8/40  bone]
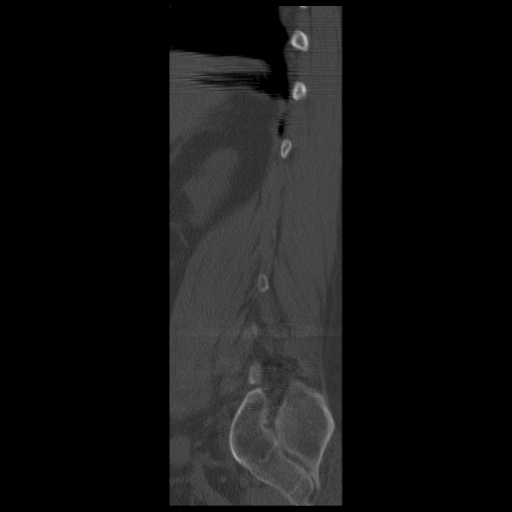
[im 16/40  bone]
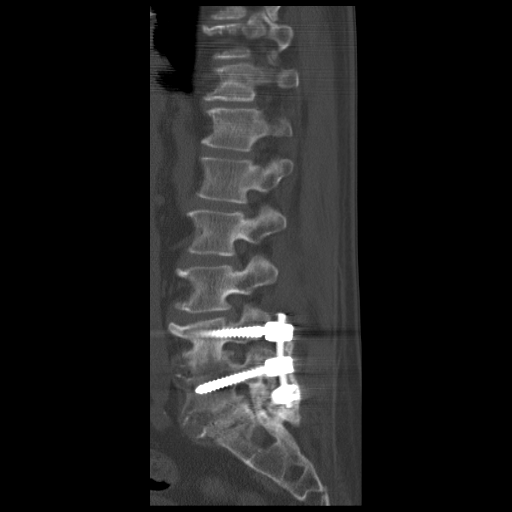
[im 24/40  bone]
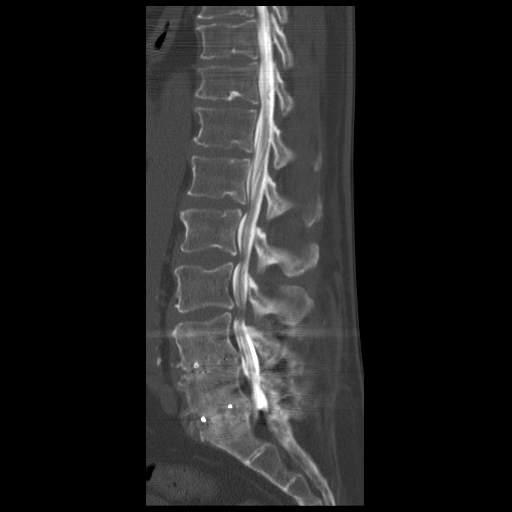

[Series 401: sag bone · sagittal · 0.64mm/px · 3 of 40 slices shown]
[im 10/40  bone]
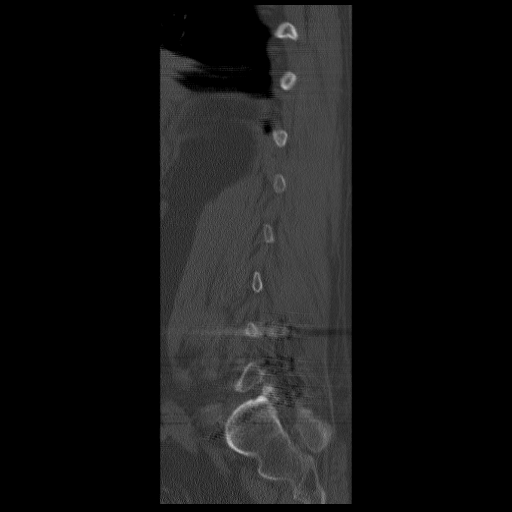
[im 20/40  bone]
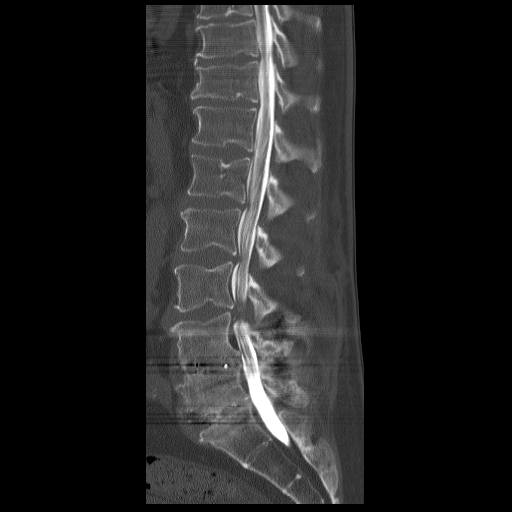
[im 30/40  bone]
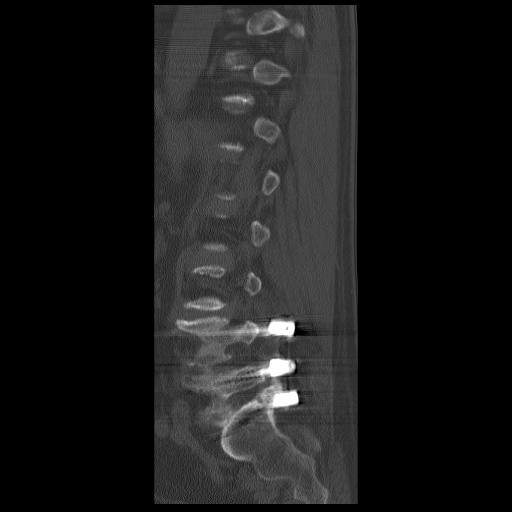

[10 of 35 positions shown; findings below may reference images not displayed]

FINDINGS: Fusion with pedicle screws, connecting bar and interbody
spacer L4-5 and L5-S1.

Moderate L2-3 and L3-4 spinal stenosis most notable with extension.

No abnormal motion occurs between flexion and extension.
IMPRESSION: Fusion L4-5 and L5-S1.

Moderate L2-3 and L3-4 spinal stenosis most notable with extension.

CT MYELOGRAPHY LUMBAR SPINE
FINDINGS: Last fully open disc space is labeled L5-S1.  Present
examination incorporates from  T9-10 disc space through the lower
sacrum.

Conus L2 level.

Hyperplasia of the left adrenal gland.  Calcified aorta
incompletely assessed on the present exam.

T9-10:  Small Schmorl's node deformity.  Mild facet joint
degenerative changes.

T10-11:  Small Schmorl's node deformity.  Minimal bulge.

T11-12:  Schmorl's node deformity. Very mild bulge.

T12:  Posterior to the central aspect of the T12 vertebral body is
a partially calcified structure which causes mild spinal stenosis
and minimal displacement of the adjacent cord.  Etiology
indeterminate.  This may represent ossification of the posterior
longitudinal ligament although small extruded partially calcified
disc not excluded.

T12-L1:  Small Schmorl's node deformity.

L1-2:  Bulge/small broad-based protrusion.  Mild spinal stenosis
and minimal bilateral foraminal narrowing.  Mild impression upon
the ventral nerve roots.

L2-3:  Moderate bulge/broad-based protrusion.  Short pedicles.
Facet joint degenerative changes.  Multifactorial mild to slightly
moderate spinal stenosis.

L3-4:  Moderate bulge/broad-based protrusion.  Ligamentum flavum
hypertrophy.  Facet joint degenerative changes.  Short pedicles.
Moderate to slightly marked spinal stenosis.  Lateral extension of
disc slightly greater to the left approaching but not compressing
the exiting L3 nerve roots.

L4-5:  Prior fusion with pedicle screws and posterior connecting
bar.  Solid bony fusion across the disc space involving posterior
elements.  Broad-based bony spur extends into the inferior aspect
of the neural foramen greater on the right without significant
nerve root compression.  No significant spinal stenosis.

L5-S1:  Prior fusion with pedicle screws and posterior connecting
bar.  Solid bony fusion across disc space.  Small spur extends into
the neural foramen greater on the right with slight encroachment
upon but not significant compression of the exiting right L5 nerve
root.  No significant spinal stenosis.
IMPRESSION: Prior fusion L4-5 and L5 S1 without significant spinal stenosis.
Bony spur in the neural foramen greater on the right without
significant nerve root compression.

L3-4 multifactorial moderate to slightly marked spinal stenosis.

L2-3 multifactorial mild to slightly moderate spinal stenosis.

L1-2 bulge/small broad-based protrusion.  Mild spinal stenosis with
mild impression upon the ventral nerve roots.

Posterior to the central aspect of the T12 vertebral body is a
partially calcified structure which causes mild spinal stenosis and
minimal displacement of the adjacent cord.  Etiology indeterminate.
This may represent ossification of the posterior longitudinal
ligament although small extruded partially calcified disc not
excluded.

Within the superior right ilium, nonspecific 1.6 x 1.1 cm (series 3
image 98) bone lesion.  This does not appear to be a donor site.
In retrospect this was present previously.

## 2013-11-17 ENCOUNTER — Telehealth: Payer: Self-pay | Admitting: *Deleted

## 2013-11-17 ENCOUNTER — Ambulatory Visit (INDEPENDENT_AMBULATORY_CARE_PROVIDER_SITE_OTHER): Payer: Medicaid Other | Admitting: Cardiology

## 2013-11-17 ENCOUNTER — Encounter: Payer: Self-pay | Admitting: Cardiology

## 2013-11-17 VITALS — BP 149/76 | HR 76 | Ht 65.0 in | Wt 177.8 lb

## 2013-11-17 DIAGNOSIS — E785 Hyperlipidemia, unspecified: Secondary | ICD-10-CM

## 2013-11-17 DIAGNOSIS — Z79899 Other long term (current) drug therapy: Secondary | ICD-10-CM

## 2013-11-17 DIAGNOSIS — E781 Pure hyperglyceridemia: Secondary | ICD-10-CM

## 2013-11-17 DIAGNOSIS — I442 Atrioventricular block, complete: Secondary | ICD-10-CM

## 2013-11-17 NOTE — Patient Instructions (Signed)
   Lab for Lipid panel - Reminder:  Nothing to eat or drink after 12 midnight prior to labs. Office will contact with results via phone or letter.   Your physician wants you to follow up in: 6 months.  You will receive a reminder letter in the mail one-two months in advance.  If you don't receive a letter, please call our office to schedule the follow up appointment

## 2013-11-17 NOTE — Telephone Encounter (Signed)
Spoke with patient.  I advised him to call [employer] 800 tech support # for advice on the "Tommie Copper" shirt.

## 2013-11-17 NOTE — Progress Notes (Signed)
Clinical Summary Mr. Annice NeedyGauldin is a 63 y.o.male seen today for follow up of the following medical problems.   1. Complete heart block  - s/p pacemaker placement 2004  - normal device check 09/2013 - denies any significant lightheadedness or dizziness, no palps, no syncope   2. HTN   - checks bp twice a day. Typically 110ss/60s. - compliant with meds  3. HL  - compliant w/ statin, denies any side effects  4. Bladder CA - new diagnosis, followed by Dr Nechama GuardBauer - reports upcoming surgery to have removed   Past Medical History  Diagnosis Date  . Complete heart block     with prior syncope s/p PPM 2004 in [place] (MDT Kappa);  08/16/2011 PPM Gen. Change: MDT Adapta L ADDDR1, [id number] H.  . HTN (hypertension), benign   . Syncope     a.  2001 - normal cath;  b. 2012 Normal Myoview, EF 58%.  Marland Kitchen. Anxiety   . HLD (hyperlipidemia)   . DDD (degenerative disc disease)     with multiple prior back surgeries   . Complication of anesthesia 05/2011    had difficulty breathing after surgery=C8 nerve root compression  . Asthma     hasn't used inhaler at least 4-5 months.  . Pneumonia last 1998    has had 8x  . Hepatitis     Hx Hepatitis A & B  . GERD (gastroesophageal reflux disease)   . PONV (postoperative nausea and vomiting)   . Pacemaker   . Shortness of breath      Allergies  Allergen Reactions  . Other Other (See Comments)    Nuclear Stress Medicine- made him feel" out of touch" w/ reality.  . Prednisone Other (See Comments)    REACTION: pain in joints  . Codeine Rash    REACTION: rash,itch     Current Outpatient Prescriptions  Medication Sig Dispense Refill  . albuterol (PROVENTIL HFA;VENTOLIN HFA) 108 (90 BASE) MCG/ACT inhaler Inhale 2 puffs into the lungs every 6 (six) hours as needed. For shortness of breath      . amLODipine (NORVASC) 10 MG tablet Take 5 mg by mouth daily.       . Ascorbic Acid (VITAMIN C) 1000 MG tablet Take 1,000 mg by mouth daily.        Marland Kitchen.  atorvastatin (LIPITOR) 40 MG tablet Take 1 tablet (40 mg total) by mouth daily.  30 tablet  3  . Cholecalciferol (VITAMIN D) 2000 UNITS CAPS Take 1 capsule by mouth daily.      Marland Kitchen. desonide (DESOWEN) 0.05 % cream Apply 1 application topically 2 (two) times daily as needed. For flare ups      . diazepam (VALIUM) 10 MG tablet Take 10 mg by mouth every 8 (eight) hours as needed. For anxiety      . hydrochlorothiazide (HYDRODIURIL) 25 MG tablet Take 1 tablet (25 mg total) by mouth daily.  30 tablet  6  . HYDROcodone-acetaminophen (NORCO) 10-325 MG per tablet Take 1 tablet by mouth every 6 (six) hours as needed. For pain      . HYDROmorphone (DILAUDID) 4 MG tablet Take 4 mg by mouth every 6 (six) hours as needed for pain.      . Multiple Vitamin (MULTIVITAMIN) tablet Take 1 tablet by mouth daily.        Marland Kitchen. omeprazole (PRILOSEC) 20 MG capsule Take 20 mg by mouth daily.      Marland Kitchen. PARoxetine (PAXIL) 20 MG tablet Take 10 mg  by mouth daily.      . potassium chloride (K-DUR,KLOR-CON) 10 MEQ tablet Take 10 mEq by mouth 2 (two) times daily.        No current facility-administered medications for this visit.     Past Surgical History  Procedure Laterality Date  . Cervical neck surgeries    . Pacemaker insertion      Implanted 2004 in [place]  . Cardiac catheterization  2001    no significant CAD  . Insert / replace / remove pacemaker  08/2011    initially inserted 12/2002  . Shoulder arthroscopy distal clavicle excision and open rotator cuff repair  2009    right  . Shoulder arthroscopy distal clavicle excision and open rotator cuff repair  2010    Left  . Carpal tunnel release  1979  . Ulnar tunnel release  1980's(late) or early 1990's  . Pacemaker generator change  08/16/11    [employer]onic Adapta L implanted by Dr Johney Frame, lead implangted 2004 in [place]  . Back surgery  08/2009, 09/2011     Allergies  Allergen Reactions  . Other Other (See Comments)    Nuclear Stress Medicine- made him feel" out of  touch" w/ reality.  . Prednisone Other (See Comments)    REACTION: pain in joints  . Codeine Rash    REACTION: rash,itch      Family History  Problem Relation Age of Onset  . Diabetes Neg Hx   . Hypertension Neg Hx   . Coronary artery disease Neg Hx      Social History Mr. Whichard reports that he quit smoking about 35 years ago. His smoking use included Cigarettes. He has a 6 pack-year smoking history. He has never used smokeless tobacco. Mr. Stauffer reports that he does not drink alcohol.   Review of Systems CONSTITUTIONAL: No weight loss, fever, chills, weakness or fatigue.  HEENT: Eyes: No visual loss, blurred vision, double vision or yellow sclerae.No hearing loss, sneezing, congestion, runny nose or sore throat.  SKIN: No rash or itching.  CARDIOVASCULAR: per HPI RESPIRATORY: No shortness of breath, cough or sputum.  GASTROINTESTINAL: No anorexia, nausea, vomiting or diarrhea. No abdominal pain or blood.  GENITOURINARY: No burning on urination, no polyuria NEUROLOGICAL: No headache, dizziness, syncope, paralysis, ataxia, numbness or tingling in the extremities. No change in bowel or bladder control.  MUSCULOSKELETAL: No muscle, back pain, joint pain or stiffness.  LYMPHATICS: No enlarged nodes. No history of splenectomy.  PSYCHIATRIC: No history of depression or anxiety.  ENDOCRINOLOGIC: No reports of sweating, cold or heat intolerance. No polyuria or polydipsia.  Marland Kitchen   Physical Examination p 76 bp 149/76 Wt 177 lbs BMI 30  Gen: resting comfortably, no acute distress HEENT: no scleral icterus, pupils equal round and reactive, no palptable cervical adenopathy,  CV: RRR, no m/r/g, no JVD, no carotid bruits Resp: mild expiratory wheezing GI: abdomen is soft, non-tender, non-distended, normal bowel sounds, no hepatosplenomegaly MSK: extremities are warm, no edema.  Skin: warm, no rash Neuro:  no focal deficits Psych: appropriate affect      Assessment and Plan    1. Complete heart block  - history ofpacemaker placement, normal function on last check in April 2015. No current symptoms  - continue routine pacemaker checks   2. HTN  - stabilized and at goal since stopping labetalol and starting HCTZ  - continue current meds   3. HL: elevated lipids on last check  - will repeat panel      Christiane Ha  Margy ClarksF. Ahmadou Bolz, M.D., F.A.C.C.

## 2014-01-10 ENCOUNTER — Other Ambulatory Visit: Payer: Self-pay | Admitting: *Deleted

## 2014-01-10 MED ORDER — ATORVASTATIN CALCIUM 40 MG PO TABS: 40.0000 mg | ORAL_TABLET | Freq: Every day | ORAL | Status: DC

## 2014-01-19 ENCOUNTER — Ambulatory Visit (INDEPENDENT_AMBULATORY_CARE_PROVIDER_SITE_OTHER): Payer: Medicaid Other | Admitting: *Deleted

## 2014-01-19 ENCOUNTER — Telehealth: Payer: Self-pay | Admitting: Cardiology

## 2014-01-19 DIAGNOSIS — I442 Atrioventricular block, complete: Secondary | ICD-10-CM

## 2014-01-19 LAB — MDC_IDC_ENUM_SESS_TYPE_REMOTE
Battery Impedance: 160 Ohm
Battery Remaining Longevity: 120 mo
Battery Voltage: 2.79 V
Brady Statistic AP VP Percent: 13 %
Brady Statistic AS VS Percent: 0 %
Date Time Interrogation Session: 20150722212131
Lead Channel Impedance Value: 410 Ohm
Lead Channel Pacing Threshold Pulse Width: 0.4 ms
Lead Channel Setting Pacing Amplitude: 2 V
Lead Channel Setting Pacing Amplitude: 2.5 V
Lead Channel Setting Pacing Pulse Width: 0.4 ms
Lead Channel Setting Sensing Sensitivity: 5.6 mV
MDC IDC MSMT LEADCHNL RA PACING THRESHOLD AMPLITUDE: 0.625 V
MDC IDC MSMT LEADCHNL RA PACING THRESHOLD PULSEWIDTH: 0.4 ms
MDC IDC MSMT LEADCHNL RA SENSING INTR AMPL: 2.8 mV
MDC IDC MSMT LEADCHNL RV IMPEDANCE VALUE: 548 Ohm
MDC IDC MSMT LEADCHNL RV PACING THRESHOLD AMPLITUDE: 1.125 V
MDC IDC STAT BRADY AP VS PERCENT: 0 %
MDC IDC STAT BRADY AS VP PERCENT: 87 %

## 2014-01-19 NOTE — Telephone Encounter (Signed)
Spoke with pt and reminded pt of remote transmission that is due today. Pt verbalized understanding.   

## 2014-01-20 NOTE — Progress Notes (Signed)
Remote pacemaker transmission.   

## 2014-02-10 ENCOUNTER — Encounter: Payer: Self-pay | Admitting: Cardiology

## 2014-02-14 ENCOUNTER — Encounter: Payer: Self-pay | Admitting: Internal Medicine

## 2014-03-01 ENCOUNTER — Encounter: Payer: Self-pay | Admitting: *Deleted

## 2014-03-01 ENCOUNTER — Telehealth: Payer: Self-pay | Admitting: *Deleted

## 2014-03-01 DIAGNOSIS — E781 Pure hyperglyceridemia: Secondary | ICD-10-CM

## 2014-03-01 NOTE — Telephone Encounter (Signed)
Per recent chart follow up - lipids have not been done yet.  Letter & order mailed today.

## 2014-04-22 ENCOUNTER — Encounter: Payer: Medicaid Other | Admitting: Internal Medicine

## 2014-05-16 NOTE — Progress Notes (Signed)
Clinical Summary [name] is a 63 y.o.male seen today for follow up of the following medical problems.  1. Complete heart block  - s/p pacemaker placement 2004  - normal device check 12/2013 - denies any significant lightheadedness or dizziness, no palps, no syncope   2. HTN  - checks bp twice a day. Typically 110s/60s. - compliant with meds  3. HL  - compliant w/ statin, denies any side effects - no recent panel in our system, reports recent panel earlier this month with his pcp.      Past Medical History  Diagnosis Date  . Complete heart block     with prior syncope s/p PPM 2004 in [place] (MDT Kappa);  08/16/2011 PPM Gen. Change: MDT Adapta L ADDDR1, [id number] H.  . HTN (hypertension), benign   . Syncope     a.  2001 - normal cath;  b. 2012 Normal Myoview, EF 58%.  Marland Kitchen. Anxiety   . HLD (hyperlipidemia)   . DDD (degenerative disc disease)     with multiple prior back surgeries   . Complication of anesthesia 05/2011    had difficulty breathing after surgery=C8 nerve root compression  . Asthma     hasn't used inhaler at least 4-5 months.  . Pneumonia last 1998    has had 8x  . Hepatitis     Hx Hepatitis A & B  . GERD (gastroesophageal reflux disease)   . PONV (postoperative nausea and vomiting)   . Pacemaker   . Shortness of breath      Allergies  Allergen Reactions  . Other Other (See Comments)    Nuclear Stress Medicine- made him feel" out of touch" w/ reality.  . Prednisone Other (See Comments)    REACTION: pain in joints  . Codeine Rash    REACTION: rash,itch     Current Outpatient Prescriptions  Medication Sig Dispense Refill  . albuterol (PROVENTIL HFA;VENTOLIN HFA) 108 (90 BASE) MCG/ACT inhaler Inhale 2 puffs into the lungs every 6 (six) hours as needed. For shortness of breath    . amLODipine (NORVASC) 10 MG tablet Take 5 mg by mouth daily.     . Ascorbic Acid (VITAMIN C) 1000 MG tablet Take 1,000 mg by mouth daily.      Marland Kitchen. atorvastatin  (LIPITOR) 40 MG tablet Take 1 tablet (40 mg total) by mouth daily. 30 tablet 6  . Cholecalciferol (VITAMIN D) 2000 UNITS CAPS Take 1 capsule by mouth daily.    Marland Kitchen. desonide (DESOWEN) 0.05 % cream Apply 1 application topically 2 (two) times daily as needed. For flare ups    . diazepam (VALIUM) 10 MG tablet Take 10 mg by mouth every 8 (eight) hours as needed. For anxiety    . Flaxseed, Linseed, (FLAX SEED OIL) 1000 MG CAPS Take 4,000 mg by mouth daily.    . hydrochlorothiazide (HYDRODIURIL) 25 MG tablet Take 1 tablet (25 mg total) by mouth daily. 30 tablet 6  . HYDROcodone-acetaminophen (NORCO) 10-325 MG per tablet Take 1 tablet by mouth every 8 (eight) hours as needed. For pain    . HYDROmorphone (DILAUDID) 4 MG tablet Take 4 mg by mouth every 6 (six) hours as needed for pain.    Marland Kitchen. ketoconazole (NIZORAL) 2 % cream Apply 1 application topically daily as needed for irritation.    . Multiple Vitamin (MULTIVITAMIN) tablet Take 1 tablet by mouth daily.      Marland Kitchen. omeprazole (PRILOSEC) 20 MG capsule Take 20 mg by mouth daily.    .Marland Kitchen  PARoxetine (PAXIL) 20 MG tablet Take 10 mg by mouth daily.    . potassium chloride (K-DUR,KLOR-CON) 10 MEQ tablet Take 10 mEq by mouth 2 (two) times daily.      No current facility-administered medications for this visit.     Past Surgical History  Procedure Laterality Date  . Cervical neck surgeries    . Pacemaker insertion      Implanted 2004 in [place]  . Cardiac catheterization  2001    no significant CAD  . Insert / replace / remove pacemaker  08/2011    initially inserted 12/2002  . Shoulder arthroscopy distal clavicle excision and open rotator cuff repair  2009    right  . Shoulder arthroscopy distal clavicle excision and open rotator cuff repair  2010    Left  . Carpal tunnel release  1979  . Ulnar tunnel release  1980's(late) or early 1990's  . Pacemaker generator change  08/16/11    [employer] Adapta L implanted by Dr Jason Yu, lead implangted 2004 in [place]  .  Back surgery  08/2009, 09/2011     Allergies  Allergen Reactions  . Other Other (See Comments)    Nuclear Stress Medicine- made him feel" out of touch" w/ reality.  . Prednisone Other (See Comments)    REACTION: pain in joints  . Codeine Rash    REACTION: rash,itch      Family History  Problem Relation Age of Onset  . Diabetes Neg Hx   . Hypertension Neg Hx   . Coronary artery disease Neg Hx      Social History [name] reports that he quit smoking about 36 years ago. His smoking use included Cigarettes. He has a 6 pack-year smoking history. He has never used smokeless tobacco. [name] reports that he does not drink alcohol.   Review of Systems CONSTITUTIONAL: No weight loss, fever, chills, weakness or fatigue.  HEENT: Eyes: No visual loss, blurred vision, double vision or yellow sclerae.No hearing loss, sneezing, congestion, runny nose or sore throat.  SKIN: No rash or itching.  CARDIOVASCULAR: per HPI RESPIRATORY: No shortness of breath, cough or sputum.  GASTROINTESTINAL: No anorexia, nausea, vomiting or diarrhea. No abdominal pain or blood.  GENITOURINARY: No burning on urination, no polyuria NEUROLOGICAL: No headache, dizziness, syncope, paralysis, ataxia, numbness or tingling in the extremities. No change in bowel or bladder control.  MUSCULOSKELETAL: No muscle, back pain, joint pain or stiffness.  LYMPHATICS: No enlarged nodes. No history of splenectomy.  PSYCHIATRIC: No history of depression or anxiety.  ENDOCRINOLOGIC: No reports of sweating, cold or heat intolerance. No polyuria or polydipsia.  Marland Kitchen.   Physical Examination p 84 bp 132/69 Wt 179 lbs BMI 30 Gen: resting comfortably, no acute distress HEENT: no scleral icterus, pupils equal round and reactive, no palptable cervical adenopathy,  CV: RRR, no m/r/g, no JVD, no carotid bruits Resp: Clear to auscultation bilaterally GI: abdomen is soft, non-tender, non-distended, normal bowel sounds, no  hepatosplenomegaly MSK: extremities are warm, no edema.  Skin: warm, no rash Neuro:  no focal deficits Psych: appropriate affect     Assessment and Plan   1. Complete heart block  - no current symptoms, pacemaker with normal function on last check 12/2013 - continue monitoring.   2. HTN  - at goal, continue current meds  3. Hyperlipidemia - will request most recent lipid panel from pcp, continue current statin at this time.    F/u 1 year  Antoine PocheJonathan F. Suhaas Agena, M.D.

## 2014-05-17 ENCOUNTER — Ambulatory Visit (INDEPENDENT_AMBULATORY_CARE_PROVIDER_SITE_OTHER): Payer: Medicaid Other | Admitting: Cardiology

## 2014-05-17 ENCOUNTER — Encounter: Payer: Self-pay | Admitting: Cardiology

## 2014-05-17 ENCOUNTER — Ambulatory Visit (INDEPENDENT_AMBULATORY_CARE_PROVIDER_SITE_OTHER): Payer: Medicaid Other | Admitting: *Deleted

## 2014-05-17 VITALS — BP 132/69 | HR 84 | Ht 65.0 in | Wt 179.0 lb

## 2014-05-17 DIAGNOSIS — I442 Atrioventricular block, complete: Secondary | ICD-10-CM

## 2014-05-17 DIAGNOSIS — I10 Essential (primary) hypertension: Secondary | ICD-10-CM

## 2014-05-17 DIAGNOSIS — Z23 Encounter for immunization: Secondary | ICD-10-CM

## 2014-05-17 DIAGNOSIS — E785 Hyperlipidemia, unspecified: Secondary | ICD-10-CM

## 2014-05-17 NOTE — Patient Instructions (Signed)
Continue all current medications. Your physician wants you to follow up in:  1 year.  You will receive a reminder letter in the mail one-two months in advance.  If you don't receive a letter, please call our office to schedule the follow up appointment   

## 2014-06-02 ENCOUNTER — Other Ambulatory Visit: Payer: Self-pay | Admitting: Neurosurgery

## 2014-06-02 ENCOUNTER — Other Ambulatory Visit (HOSPITAL_COMMUNITY): Payer: Self-pay | Admitting: Neurosurgery

## 2014-06-02 DIAGNOSIS — M542 Cervicalgia: Secondary | ICD-10-CM

## 2014-06-02 DIAGNOSIS — M5023 Other cervical disc displacement, cervicothoracic region: Secondary | ICD-10-CM

## 2014-06-02 DIAGNOSIS — M47816 Spondylosis without myelopathy or radiculopathy, lumbar region: Secondary | ICD-10-CM

## 2014-06-06 ENCOUNTER — Other Ambulatory Visit: Payer: Self-pay | Admitting: *Deleted

## 2014-06-06 MED ORDER — HYDROCHLOROTHIAZIDE 25 MG PO TABS: 25.0000 mg | ORAL_TABLET | Freq: Every day | ORAL | Status: DC

## 2014-06-06 NOTE — Telephone Encounter (Signed)
HCTZ refilled. 

## 2014-06-09 ENCOUNTER — Encounter (HOSPITAL_COMMUNITY): Payer: Self-pay | Admitting: Internal Medicine

## 2014-06-10 ENCOUNTER — Encounter: Payer: Medicaid Other | Admitting: Internal Medicine

## 2014-06-16 ENCOUNTER — Ambulatory Visit (HOSPITAL_COMMUNITY): Payer: Medicaid Other

## 2014-07-14 ENCOUNTER — Ambulatory Visit (HOSPITAL_COMMUNITY)
Admission: RE | Admit: 2014-07-14 | Discharge: 2014-07-14 | Disposition: A | Discharge status: Home / Self Care | Payer: Medicaid Other | Source: Ambulatory Visit | Attending: Neurosurgery | Admitting: Neurosurgery

## 2014-07-14 DIAGNOSIS — M4806 Spinal stenosis, lumbar region: Secondary | ICD-10-CM | POA: Diagnosis not present

## 2014-07-14 DIAGNOSIS — M4802 Spinal stenosis, cervical region: Secondary | ICD-10-CM | POA: Insufficient documentation

## 2014-07-14 DIAGNOSIS — M5124 Other intervertebral disc displacement, thoracic region: Secondary | ICD-10-CM | POA: Insufficient documentation

## 2014-07-14 DIAGNOSIS — M5023 Other cervical disc displacement, cervicothoracic region: Secondary | ICD-10-CM

## 2014-07-14 DIAGNOSIS — M47816 Spondylosis without myelopathy or radiculopathy, lumbar region: Secondary | ICD-10-CM

## 2014-07-14 DIAGNOSIS — M542 Cervicalgia: Secondary | ICD-10-CM | POA: Diagnosis not present

## 2014-07-14 MED ORDER — ONDANSETRON HCL 4 MG/2ML IJ SOLN: 4.0000 mg | Freq: Four times a day (QID) | INTRAMUSCULAR | Status: DC | PRN

## 2014-07-14 MED ORDER — DIAZEPAM 5 MG PO TABS
ORAL_TABLET | ORAL | Status: AC
  Administered 2014-07-14: 10 mg via ORAL
  Filled 2014-07-14: qty 2

## 2014-07-14 MED ORDER — IOHEXOL 300 MG/ML  SOLN
10.0000 mL | Freq: Once | INTRAMUSCULAR | Status: AC | PRN
  Administered 2014-07-14: 10 mL via INTRATHECAL

## 2014-07-14 MED ORDER — DIAZEPAM 5 MG PO TABS
10.0000 mg | ORAL_TABLET | Freq: Once | ORAL | Status: AC
  Administered 2014-07-14: 10 mg via ORAL

## 2014-07-14 NOTE — Discharge Instructions (Signed)
Myelogram and Lumbar Puncture Discharge Instructions ° °1. Go home and rest quietly for the next 24 hours.  It is important to lie flat for the next 24 hours.  Get up only to go to the restroom.  You may lie in the bed or on a couch on your back, your stomach, your left side or your right side.  You may have one pillow under your head.  You may have pillows between your knees while you are on your side or under your knees while you are on your back. ° °2. DO NOT drive today.  Recline the seat as far back as it will go, while still wearing your seat belt, on the way home. ° °3. You may get up to go to the bathroom as needed.  You may sit up for 10 minutes to eat.  You may resume your normal diet and medications unless otherwise indicated. ° °4. The incidence of headache, nausea, or vomiting is about 5% (one in 20 patients).  If you develop a headache, lie flat and drink plenty of fluids until the headache goes away.  Caffeinated beverages may be helpful.  If you develop severe nausea and vomiting or a headache that does not go away with flat bed rest, call Dr Stern ° °5. You may resume normal activities after your 24 hours of bed rest is over; however, do not exert yourself strongly or do any heavy lifting tomorrow. ° °6. Call your physician for a follow-up appointment.  The results of your myelogram will be sent directly to your physician by the following day. ° °7. If you have any questions or if complications develop after you arrive home, please call Dr Stern ° °Discharge instructions have been explained to the patient.  The patient, or the person responsible for the patient, fully understands these instructions. ° ° °

## 2014-07-14 NOTE — Procedures (Signed)
Thoracic 12 L 1 puncture Omnipaque 300 6 cc instilled.  Patient tolerated procedure without difficulty.

## 2014-08-05 ENCOUNTER — Ambulatory Visit (INDEPENDENT_AMBULATORY_CARE_PROVIDER_SITE_OTHER): Payer: Medicaid Other | Admitting: Internal Medicine

## 2014-08-05 ENCOUNTER — Encounter: Payer: Self-pay | Admitting: Internal Medicine

## 2014-08-05 VITALS — BP 128/72 | HR 81 | Ht 65.0 in | Wt 175.4 lb

## 2014-08-05 DIAGNOSIS — I442 Atrioventricular block, complete: Secondary | ICD-10-CM

## 2014-08-05 DIAGNOSIS — I10 Essential (primary) hypertension: Secondary | ICD-10-CM

## 2014-08-05 LAB — MDC_IDC_ENUM_SESS_TYPE_INCLINIC
Battery Remaining Longevity: 115 mo
Battery Voltage: 2.8 V
Brady Statistic AP VS Percent: 0 %
Brady Statistic AS VP Percent: 86 %
Brady Statistic AS VS Percent: 0 %
Date Time Interrogation Session: 20160205122349
Lead Channel Impedance Value: 433 Ohm
Lead Channel Impedance Value: 561 Ohm
Lead Channel Pacing Threshold Amplitude: 0.75 V
Lead Channel Pacing Threshold Pulse Width: 0.4 ms
Lead Channel Pacing Threshold Pulse Width: 0.64 ms
Lead Channel Setting Pacing Amplitude: 2 V
Lead Channel Setting Pacing Pulse Width: 0.64 ms
Lead Channel Setting Sensing Sensitivity: 5.6 mV
MDC IDC MSMT BATTERY IMPEDANCE: 160 Ohm
MDC IDC MSMT LEADCHNL RA PACING THRESHOLD AMPLITUDE: 0.5 V
MDC IDC MSMT LEADCHNL RA SENSING INTR AMPL: 4 mV
MDC IDC SET LEADCHNL RV PACING AMPLITUDE: 2.5 V
MDC IDC STAT BRADY AP VP PERCENT: 14 %

## 2014-08-05 NOTE — Progress Notes (Signed)
PCP: Ernestine ConradBLUTH, KIRK, MD Primary Cardiologist:  Dr Garnet SierrasBranch  Jason Yu is a 64 y.o. male who presents today for routine electrophysiology followup.  Since his last office visit, the patient reports doing very well.  His SOB has recently improved with the addition of nebulizer therapy.  Though he does not smoke tobacco, he continues to use marijuana.  He says he has  "cut back" since I saw him last.  Today, he denies symptoms of palpitations, chest pain,  lower extremity edema, dizziness, presyncope, or syncope.  The patient is otherwise without complaint today.   Past Medical History  Diagnosis Date  . Complete heart block     with prior syncope s/p PPM 2004 in [place] (MDT Kappa);  08/16/2011 PPM Gen. Change: MDT Adapta L ADDDR1, [id number] H.  . HTN (hypertension), benign   . Syncope     a.  2001 - normal cath;  b. 2012 Normal Myoview, EF 58%.  Jason Yu. Anxiety   . HLD (hyperlipidemia)   . DDD (degenerative disc disease)     with multiple prior back surgeries   . Complication of anesthesia 05/2011    had difficulty breathing after surgery=C8 nerve root compression  . Asthma     hasn't used inhaler at least 4-5 months.  . Pneumonia last 1998    has had 8x  . Hepatitis     Hx Hepatitis A & B  . GERD (gastroesophageal reflux disease)   . PONV (postoperative nausea and vomiting)   . Pacemaker   . Shortness of breath    Past Surgical History  Procedure Laterality Date  . Cervical neck surgeries    . Pacemaker insertion      Implanted 2004 in [place]  . Cardiac catheterization  2001    no significant CAD  . Insert / replace / remove pacemaker  08/2011    initially inserted 12/2002  . Shoulder arthroscopy distal clavicle excision and open rotator cuff repair  2009    right  . Shoulder arthroscopy distal clavicle excision and open rotator cuff repair  2010    Left  . Carpal tunnel release  1979  . Ulnar tunnel release  1980's(late) or early 1990's  . Pacemaker generator change  08/16/11   [employer] Adapta L implanted by Dr Johney FrameAllred, lead implangted 2004 in [place]  . Back surgery  08/2009, 09/2011  . Pacemaker generator change Left 08/16/2011    Procedure: PACEMAKER GENERATOR CHANGE;  Surgeon: Hillis RangeJames Crosby Bevan, MD;  [facility];  Service: Cardiovascular;  Laterality: Left;    Current Outpatient Prescriptions  Medication Sig Dispense Refill  . albuterol (PROVENTIL HFA;VENTOLIN HFA) 108 (90 BASE) MCG/ACT inhaler Inhale 2 puffs into the lungs every 6 (six) hours as needed. For shortness of breath    . amLODipine (NORVASC) 10 MG tablet Take 10 mg by mouth daily.     . Ascorbic Acid (VITAMIN C) 1000 MG tablet Take 1,000 mg by mouth daily.      Jason Yu. atorvastatin (LIPITOR) 40 MG tablet Take 1 tablet (40 mg total) by mouth daily. 30 tablet 6  . Cholecalciferol (VITAMIN D) 2000 UNITS CAPS Take 1 capsule by mouth daily.    Jason Yu. co-enzyme Q-10 30 MG capsule Take 30 mg by mouth daily.    Jason Yu. desonide (DESOWEN) 0.05 % cream Apply 1 application topically 2 (two) times daily as needed. For flare ups    . diazepam (VALIUM) 10 MG tablet Take 10 mg by mouth every 8 (eight) hours as needed. For  anxiety    . Flaxseed, Linseed, (FLAX SEED OIL) 1000 MG CAPS Take 4,000 mg by mouth daily.    . hydrochlorothiazide (HYDRODIURIL) 25 MG tablet Take 1 tablet (25 mg total) by mouth daily. 30 tablet 6  . HYDROcodone-acetaminophen (NORCO) 10-325 MG per tablet Take 1 tablet by mouth every 8 (eight) hours as needed. For pain    . HYDROmorphone (DILAUDID) 4 MG tablet Take 4 mg by mouth every 6 (six) hours as needed for pain.    Jason Yu ketoconazole (NIZORAL) 2 % cream Apply 1 application topically daily as needed for irritation.    . Multiple Vitamin (MULTIVITAMIN) tablet Take 1 tablet by mouth daily.      Jason Yu omeprazole (PRILOSEC) 20 MG capsule Take 20 mg by mouth daily.    Jason Yu PARoxetine (PAXIL) 20 MG tablet Take 10 mg by mouth daily.     No current facility-administered medications for this visit.   ROS- all systems are  reviewed and negative as per HPI above  Physical Exam: Filed Vitals:   08/05/14 1200  BP: 128/72  Pulse: 81  Height:  (1.651 m)  Weight: 175 lb 6.4 oz (79.561 kg)    GEN- The patient is well appearing, alert and oriented x 3 today.   Head- normocephalic, atraumatic Eyes-  Sclera clear, conjunctiva pink Ears- hearing intact Oropharynx- clear Lungs- Clear to ausculation bilaterally, normal work of breathing Chest- pacemaker pocket is well healed Heart- Regular rate and rhythm, no murmurs, rubs or gallops, PMI not laterally displaced GI- soft, NT, ND, + BS Extremities- no clubbing, cyanosis, or edema MS- wearing a back brace today and walking slowly  Pacemaker interrogation- reviewed in detail today,  See PACEART report  Assessment and Plan:  1. Complete heart block Normal pacemaker function See Pace Art report No changes today  2. HTN Stable No change required today  3. Marijuana Cessation advised  Carelink every 3 months I will see again in 1 year

## 2014-08-05 NOTE — Patient Instructions (Signed)
Remote monitoring is used to monitor your Pacemaker of ICD from home. This monitoring reduces the number of office visits required to check your device to one time per year. It allows us to keep an eye on the functioning of your device to ensure it is working properly. You are scheduled for a device check from home on MAY 9TH 2016. You may send your transmission at any time that day. If you have a wireless device, the transmission will be sent automatically. After your physician reviews your transmission, you will receive a postcard with your next transmission date.  Your physician wants you to follow-up in: 1 YEAR WITH DR. ALLRED You will receive a reminder letter in the mail two months in advance. If you don't receive a letter, please call our office to schedule the follow-up appointment.  Thank you for choosing [facility]!!

## 2014-08-12 ENCOUNTER — Encounter: Payer: Self-pay | Admitting: Internal Medicine

## 2014-08-25 ENCOUNTER — Other Ambulatory Visit: Payer: Self-pay | Admitting: *Deleted

## 2014-08-25 MED ORDER — ATORVASTATIN CALCIUM 40 MG PO TABS: 40.0000 mg | ORAL_TABLET | Freq: Every day | ORAL | Status: DC

## 2014-11-07 ENCOUNTER — Ambulatory Visit (INDEPENDENT_AMBULATORY_CARE_PROVIDER_SITE_OTHER): Payer: Medicaid Other | Admitting: *Deleted

## 2014-11-07 DIAGNOSIS — I442 Atrioventricular block, complete: Secondary | ICD-10-CM

## 2014-11-07 NOTE — Progress Notes (Signed)
Remote pacemaker transmission.   

## 2014-11-09 LAB — CUP PACEART REMOTE DEVICE CHECK
Battery Impedance: 185 Ohm
Battery Remaining Longevity: 108 mo
Brady Statistic AS VP Percent: 93 %
Brady Statistic AS VS Percent: 0 %
Lead Channel Impedance Value: 518 Ohm
Lead Channel Pacing Threshold Amplitude: 0.625 V
Lead Channel Pacing Threshold Pulse Width: 0.4 ms
Lead Channel Pacing Threshold Pulse Width: 0.4 ms
Lead Channel Setting Pacing Amplitude: 2 V
Lead Channel Setting Pacing Amplitude: 2.75 V
Lead Channel Setting Pacing Pulse Width: 0.4 ms
Lead Channel Setting Sensing Sensitivity: 5.6 mV
MDC IDC MSMT BATTERY VOLTAGE: 2.79 V
MDC IDC MSMT LEADCHNL RA IMPEDANCE VALUE: 384 Ohm
MDC IDC MSMT LEADCHNL RA SENSING INTR AMPL: 2.8 mV
MDC IDC MSMT LEADCHNL RV PACING THRESHOLD AMPLITUDE: 1.375 V
MDC IDC SESS DTM: 20160505220806
MDC IDC STAT BRADY AP VP PERCENT: 7 %
MDC IDC STAT BRADY AP VS PERCENT: 0 %

## 2014-11-23 ENCOUNTER — Encounter: Payer: Self-pay | Admitting: Cardiology

## 2014-11-30 ENCOUNTER — Encounter: Payer: Self-pay | Admitting: Internal Medicine

## 2015-01-16 ENCOUNTER — Other Ambulatory Visit: Payer: Self-pay | Admitting: *Deleted

## 2015-01-16 MED ORDER — HYDROCHLOROTHIAZIDE 25 MG PO TABS: 25.0000 mg | ORAL_TABLET | Freq: Every day | ORAL | Status: DC

## 2015-01-30 ENCOUNTER — Encounter: Payer: Self-pay | Admitting: Internal Medicine

## 2015-02-06 ENCOUNTER — Ambulatory Visit (INDEPENDENT_AMBULATORY_CARE_PROVIDER_SITE_OTHER): Payer: Medicaid Other | Admitting: *Deleted

## 2015-02-06 ENCOUNTER — Telehealth: Payer: Self-pay | Admitting: Cardiology

## 2015-02-06 DIAGNOSIS — I442 Atrioventricular block, complete: Secondary | ICD-10-CM | POA: Diagnosis not present

## 2015-02-06 NOTE — Telephone Encounter (Signed)
LMOVM reminding pt to send remote transmission.   

## 2015-02-07 NOTE — Progress Notes (Signed)
Remote pacemaker transmission.   

## 2015-02-15 LAB — CUP PACEART REMOTE DEVICE CHECK
Battery Impedance: 209 Ohm
Battery Voltage: 2.8 V
Brady Statistic AP VP Percent: 13 %
Brady Statistic AP VS Percent: 0 %
Date Time Interrogation Session: 20160808192941
Lead Channel Impedance Value: 406 Ohm
Lead Channel Pacing Threshold Amplitude: 0.625 V
Lead Channel Pacing Threshold Pulse Width: 0.4 ms
Lead Channel Sensing Intrinsic Amplitude: 2.8 mV
Lead Channel Setting Pacing Amplitude: 2 V
Lead Channel Setting Pacing Amplitude: 2.5 V
Lead Channel Setting Pacing Pulse Width: 0.4 ms
Lead Channel Setting Sensing Sensitivity: 5.6 mV
MDC IDC MSMT BATTERY REMAINING LONGEVITY: 111 mo
MDC IDC MSMT LEADCHNL RV IMPEDANCE VALUE: 547 Ohm
MDC IDC MSMT LEADCHNL RV PACING THRESHOLD AMPLITUDE: 1.25 V
MDC IDC MSMT LEADCHNL RV PACING THRESHOLD PULSEWIDTH: 0.4 ms
MDC IDC STAT BRADY AS VP PERCENT: 87 %
MDC IDC STAT BRADY AS VS PERCENT: 0 %

## 2015-02-21 ENCOUNTER — Encounter: Payer: Self-pay | Admitting: Cardiology

## 2015-02-23 ENCOUNTER — Encounter: Payer: Self-pay | Admitting: Internal Medicine

## 2015-04-21 ENCOUNTER — Other Ambulatory Visit: Payer: Self-pay | Admitting: Cardiology

## 2015-05-08 ENCOUNTER — Telehealth: Payer: Self-pay | Admitting: Cardiology

## 2015-05-08 ENCOUNTER — Ambulatory Visit (INDEPENDENT_AMBULATORY_CARE_PROVIDER_SITE_OTHER): Payer: Medicaid Other | Admitting: *Deleted

## 2015-05-08 DIAGNOSIS — I442 Atrioventricular block, complete: Secondary | ICD-10-CM

## 2015-05-08 NOTE — Progress Notes (Signed)
Remote pacemaker transmission.   

## 2015-05-08 NOTE — Telephone Encounter (Signed)
Spoke with pt and reminded pt of remote transmission that is due today. Pt verbalized understanding.   

## 2015-05-09 ENCOUNTER — Encounter: Payer: Self-pay | Admitting: Cardiology

## 2015-05-09 LAB — CUP PACEART REMOTE DEVICE CHECK
Battery Impedance: 209 Ohm
Battery Remaining Longevity: 110 mo
Battery Voltage: 2.79 V
Brady Statistic AP VP Percent: 18 %
Implantable Lead Implant Date: 20040709
Implantable Lead Implant Date: 20040709
Implantable Lead Location: 753859
Implantable Lead Location: 753860
Implantable Lead Model: 5076
Implantable Lead Model: 5076
Lead Channel Impedance Value: 399 Ohm
Lead Channel Impedance Value: 533 Ohm
Lead Channel Sensing Intrinsic Amplitude: 2.8 mV
Lead Channel Setting Pacing Amplitude: 2.5 V
Lead Channel Setting Pacing Pulse Width: 0.4 ms
MDC IDC MSMT LEADCHNL RA PACING THRESHOLD AMPLITUDE: 0.625 V
MDC IDC MSMT LEADCHNL RA PACING THRESHOLD PULSEWIDTH: 0.4 ms
MDC IDC MSMT LEADCHNL RV PACING THRESHOLD AMPLITUDE: 1.25 V
MDC IDC MSMT LEADCHNL RV PACING THRESHOLD PULSEWIDTH: 0.4 ms
MDC IDC SESS DTM: 20161107184515
MDC IDC SET LEADCHNL RA PACING AMPLITUDE: 2 V
MDC IDC SET LEADCHNL RV SENSING SENSITIVITY: 5.6 mV
MDC IDC STAT BRADY AP VS PERCENT: 0 %
MDC IDC STAT BRADY AS VP PERCENT: 82 %
MDC IDC STAT BRADY AS VS PERCENT: 0 %

## 2015-05-31 ENCOUNTER — Other Ambulatory Visit: Payer: Self-pay | Admitting: Cardiology

## 2015-06-09 ENCOUNTER — Ambulatory Visit: Payer: Medicaid Other | Admitting: Cardiology

## 2015-07-31 ENCOUNTER — Other Ambulatory Visit: Payer: Self-pay | Admitting: Cardiology

## 2015-08-02 ENCOUNTER — Ambulatory Visit: Payer: Medicaid Other | Admitting: Cardiology

## 2015-08-02 NOTE — Progress Notes (Unsigned)
Patient ID: Jason Yu, male   DOB: [birth year], 65 y.o.   MRN: [id number]     Clinical Summary Jason Yu is a 65 y.o.male seen today for follow up of the following medical problems.   1. Complete heart block  - s/p pacemaker placement 2004  - normal device check 05/2015  - denies any significant lightheadedness or dizziness, no palps, no syncope   2. HTN  - checks bp twice a day. Typically 110s/60s. - compliant with meds  3. HL  - compliant w/ statin, denies any side effects - no recent panel in our system, reports recent panel earlier this month with his pcp. Past Medical History  Diagnosis Date  . Complete heart block     with prior syncope s/p PPM 2004 in [place] (MDT Kappa);  08/16/2011 PPM Gen. Change: MDT Adapta L ADDDR1, [id number] H.  . HTN (hypertension), benign   . Syncope     a.  2001 - normal cath;  b. 2012 Normal Myoview, EF 58%.  Marland Kitchen Anxiety   . HLD (hyperlipidemia)   . DDD (degenerative disc disease)     with multiple prior back surgeries   . Complication of anesthesia 05/2011    had difficulty breathing after surgery=C8 nerve root compression  . Asthma     hasn't used inhaler at least 4-5 months.  . Pneumonia last 1998    has had 8x  . Hepatitis     Hx Hepatitis A & B  . GERD (gastroesophageal reflux disease)   . PONV (postoperative nausea and vomiting)   . Pacemaker   . Shortness of breath      Allergies  Allergen Reactions  . Other Other (See Comments)    Nuclear Stress Medicine- made him feel" out of touch" w/ reality.  . Prednisone Other (See Comments)    REACTION: pain in joints High Doses  . Codeine Rash    REACTION: rash,itch     Current Outpatient Prescriptions  Medication Sig Dispense Refill  . albuterol (PROVENTIL HFA;VENTOLIN HFA) 108 (90 BASE) MCG/ACT inhaler Inhale 2 puffs into the lungs every 6 (six) hours as needed. For shortness of breath    . amLODipine (NORVASC) 10 MG tablet Take 10 mg by mouth daily.     . Ascorbic  Acid (VITAMIN C) 1000 MG tablet Take 1,000 mg by mouth daily.      Marland Kitchen atorvastatin (LIPITOR) 40 MG tablet TAKE ONE TABLET BY MOUTH ONCE DAILY 30 tablet 3  . Cholecalciferol (VITAMIN D) 2000 UNITS CAPS Take 1 capsule by mouth daily.    Marland Kitchen co-enzyme Q-10 30 MG capsule Take 30 mg by mouth daily.    Marland Kitchen desonide (DESOWEN) 0.05 % cream Apply 1 application topically 2 (two) times daily as needed. For flare ups    . diazepam (VALIUM) 10 MG tablet Take 10 mg by mouth every 8 (eight) hours as needed. For anxiety    . Flaxseed, Linseed, (FLAX SEED OIL) 1000 MG CAPS Take 4,000 mg by mouth daily.    . hydrochlorothiazide (HYDRODIURIL) 25 MG tablet Take 1 tablet (25 mg total) by mouth daily. 30 tablet 6  . HYDROcodone-acetaminophen (NORCO) 10-325 MG per tablet Take 1 tablet by mouth every 8 (eight) hours as needed. For pain    . HYDROmorphone (DILAUDID) 4 MG tablet Take 4 mg by mouth every 6 (six) hours as needed for pain.    Marland Kitchen ketoconazole (NIZORAL) 2 % cream Apply 1 application topically daily as needed for irritation.    Marland Kitchen  Multiple Vitamin (MULTIVITAMIN) tablet Take 1 tablet by mouth daily.      Marland Kitchen omeprazole (PRILOSEC) 20 MG capsule Take 20 mg by mouth daily.    Marland Kitchen PARoxetine (PAXIL) 20 MG tablet Take 10 mg by mouth daily.     No current facility-administered medications for this visit.     Past Surgical History  Procedure Laterality Date  . Cervical neck surgeries    . Pacemaker insertion      Implanted 2004 in [place]  . Cardiac catheterization  2001    no significant CAD  . Insert / replace / remove pacemaker  08/2011    initially inserted 12/2002  . Shoulder arthroscopy distal clavicle excision and open rotator cuff repair  2009    right  . Shoulder arthroscopy distal clavicle excision and open rotator cuff repair  2010    Left  . Carpal tunnel release  1979  . Ulnar tunnel release  1980's(late) or early 1990's  . Pacemaker generator change  08/16/11    Medtronic Adapta L implanted by Dr Johney Frame,  lead implangted 2004 in [place]  . Back surgery  08/2009, 09/2011  . Pacemaker generator change Left 08/16/2011    Procedure: PACEMAKER GENERATOR CHANGE;  Surgeon: Hillis Range, MD;  Location[facility] CATH LAB;  Service: Cardiovascular;  Laterality: Left;     Allergies  Allergen Reactions  . Other Other (See Comments)    Nuclear Stress Medicine- made him feel" out of touch" w/ reality.  . Prednisone Other (See Comments)    REACTION: pain in joints High Doses  . Codeine Rash    REACTION: rash,itch      Family History  Problem Relation Age of Onset  . Diabetes Neg Hx   . Hypertension Neg Hx   . Coronary artery disease Neg Hx      Social History Jason Yu reports that he quit smoking about 37 years ago. His smoking use included Cigarettes. He started smoking about 47 years ago. He has a 6 pack-year smoking history. He has never used smokeless tobacco. Jason Yu reports that he does not drink alcohol.   Review of Systems CONSTITUTIONAL: No weight loss, fever, chills, weakness or fatigue.  HEENT: Eyes: No visual loss, blurred vision, double vision or yellow sclerae.No hearing loss, sneezing, congestion, runny nose or sore throat.  SKIN: No rash or itching.  CARDIOVASCULAR:  RESPIRATORY: No shortness of breath, cough or sputum.  GASTROINTESTINAL: No anorexia, nausea, vomiting or diarrhea. No abdominal pain or blood.  GENITOURINARY: No burning on urination, no polyuria NEUROLOGICAL: No headache, dizziness, syncope, paralysis, ataxia, numbness or tingling in the extremities. No change in bowel or bladder control.  MUSCULOSKELETAL: No muscle, back pain, joint pain or stiffness.  LYMPHATICS: No enlarged nodes. No history of splenectomy.  PSYCHIATRIC: No history of depression or anxiety.  ENDOCRINOLOGIC: No reports of sweating, cold or heat intolerance. No polyuria or polydipsia.  Marland Kitchen   Physical Examination There were no vitals filed for this visit. There were no vitals filed for  this visit.  Gen: resting comfortably, no acute distress HEENT: no scleral icterus, pupils equal round and reactive, no palptable cervical adenopathy,  CV Resp: Clear to auscultation bilaterally GI: abdomen is soft, non-tender, non-distended, normal bowel sounds, no hepatosplenomegaly MSK: extremities are warm, no edema.  Skin: warm, no rash Neuro:  no focal deficits Psych: appropriate affect   Diagnostic Studies     Assessment and Plan   1. Complete heart block  - no current symptoms, pacemaker with  normal function on last check 12/2013 - continue monitoring.   2. HTN  - at goal, continue current meds  3. Hyperlipidemia - will request most recent lipid panel from pcp, continue current statin at this time.    F/u 1 year      Antoine Poche, M.D., F.A.C.C.

## 2015-08-23 ENCOUNTER — Encounter: Payer: Self-pay | Admitting: Cardiology

## 2015-08-23 ENCOUNTER — Encounter: Payer: Self-pay | Admitting: *Deleted

## 2015-08-23 ENCOUNTER — Ambulatory Visit (INDEPENDENT_AMBULATORY_CARE_PROVIDER_SITE_OTHER): Payer: Medicaid Other | Admitting: Cardiology

## 2015-08-23 VITALS — BP 119/75 | HR 66 | Ht 65.0 in | Wt 170.0 lb

## 2015-08-23 DIAGNOSIS — I10 Essential (primary) hypertension: Secondary | ICD-10-CM | POA: Diagnosis not present

## 2015-08-23 DIAGNOSIS — E785 Hyperlipidemia, unspecified: Secondary | ICD-10-CM

## 2015-08-23 DIAGNOSIS — I442 Atrioventricular block, complete: Secondary | ICD-10-CM | POA: Diagnosis not present

## 2015-08-23 NOTE — Patient Instructions (Signed)

## 2015-08-23 NOTE — Progress Notes (Addendum)
Patient ID: KINGDOM VANZANTEN, male   DOB: Sep 12, 1950, 65 y.o.   MRN: [id number]     Clinical Summary Mr. Nest is a 65 y.o.male seen today for follow up of the following medical problems.   1. Complete heart block  - s/p pacemaker placement 2004  - normal device check 05/2015  - denies any significant lightheadedness or dizziness, no palps, no syncope   2. HTN  - checks bp twice a day. Typically 90-100s/60s-70s. - compliant with meds  3. HL  - compliant w/ statin, denies any side effects - reports recent panel earlier this month with his pcp.    Past Medical History  Diagnosis Date  . Complete heart block [facility])     with prior syncope s/p PPM 2004 in [place] (MDT Kappa);  08/16/2011 PPM Gen. Change: MDT Adapta L ADDDR1, [id number] H.  . HTN (hypertension), benign   . Syncope     a.  2001 - normal cath;  b. 2012 Normal Myoview, EF 58%.  Marland Kitchen Anxiety   . HLD (hyperlipidemia)   . DDD (degenerative disc disease)     with multiple prior back surgeries   . Complication of anesthesia 05/2011    had difficulty breathing after surgery=C8 nerve root compression  . Asthma     hasn't used inhaler at least 4-5 months.  . Pneumonia last 1998    has had 8x  . Hepatitis     Hx Hepatitis A & B  . GERD (gastroesophageal reflux disease)   . PONV (postoperative nausea and vomiting)   . Pacemaker   . Shortness of breath      Allergies  Allergen Reactions  . Other Other (See Comments)    Nuclear Stress Medicine- made him feel" out of touch" w/ reality.  . Prednisone Other (See Comments)    REACTION: pain in joints High Doses  . Codeine Rash    REACTION: rash,itch     Current Outpatient Prescriptions  Medication Sig Dispense Refill  . albuterol (PROVENTIL HFA;VENTOLIN HFA) 108 (90 BASE) MCG/ACT inhaler Inhale 2 puffs into the lungs every 6 (six) hours as needed. For shortness of breath    . amLODipine (NORVASC) 10 MG tablet Take 10 mg by mouth daily.     . Ascorbic Acid  (VITAMIN C) 1000 MG tablet Take 1,000 mg by mouth daily.      Marland Kitchen atorvastatin (LIPITOR) 40 MG tablet TAKE ONE TABLET BY MOUTH ONCE DAILY 30 tablet 3  . Cholecalciferol (VITAMIN D) 2000 UNITS CAPS Take 1 capsule by mouth daily.    Marland Kitchen co-enzyme Q-10 30 MG capsule Take 30 mg by mouth daily.    Marland Kitchen desonide (DESOWEN) 0.05 % cream Apply 1 application topically 2 (two) times daily as needed. For flare ups    . diazepam (VALIUM) 10 MG tablet Take 10 mg by mouth every 8 (eight) hours as needed. For anxiety    . Flaxseed, Linseed, (FLAX SEED OIL) 1000 MG CAPS Take 4,000 mg by mouth daily.    . hydrochlorothiazide (HYDRODIURIL) 25 MG tablet Take 1 tablet (25 mg total) by mouth daily. 30 tablet 6  . HYDROcodone-acetaminophen (NORCO) 10-325 MG per tablet Take 1 tablet by mouth every 8 (eight) hours as needed. For pain    . HYDROmorphone (DILAUDID) 4 MG tablet Take 4 mg by mouth every 6 (six) hours as needed for pain.    Marland Kitchen ketoconazole (NIZORAL) 2 % cream Apply 1 application topically daily as needed for irritation.    . Multiple  Vitamin (MULTIVITAMIN) tablet Take 1 tablet by mouth daily.      Marland Kitchen omeprazole (PRILOSEC) 20 MG capsule Take 20 mg by mouth daily.    Marland Kitchen PARoxetine (PAXIL) 20 MG tablet Take 10 mg by mouth daily.     No current facility-administered medications for this visit.     Past Surgical History  Procedure Laterality Date  . Cervical neck surgeries    . Pacemaker insertion      Implanted 2004 in [place]  . Cardiac catheterization  2001    no significant CAD  . Insert / replace / remove pacemaker  08/2011    initially inserted 12/2002  . Shoulder arthroscopy distal clavicle excision and open rotator cuff repair  2009    right  . Shoulder arthroscopy distal clavicle excision and open rotator cuff repair  2010    Left  . Carpal tunnel release  1979  . Ulnar tunnel release  1980's(late) or early 1990's  . Pacemaker generator change  08/16/11    Medtronic Adapta L implanted by Dr Johney Frame, lead  implangted 2004 in [place]  . Back surgery  08/2009, 09/2011  . Pacemaker generator change Left 08/16/2011    Procedure: PACEMAKER GENERATOR CHANGE;  Surgeon: Hillis Range, MD;  Location: [facility] CATH LAB;  Service: Cardiovascular;  Laterality: Left;     Allergies  Allergen Reactions  . Other Other (See Comments)    Nuclear Stress Medicine- made him feel" out of touch" w/ reality.  . Prednisone Other (See Comments)    REACTION: pain in joints High Doses  . Codeine Rash    REACTION: rash,itch      Family History  Problem Relation Age of Onset  . Diabetes Neg Hx   . Hypertension Neg Hx   . Coronary artery disease Neg Hx      Social History Mr. Adachi reports that he quit smoking about 37 years ago. His smoking use included Cigarettes. He started smoking about 47 years ago. He has a 6 pack-year smoking history. He has never used smokeless tobacco. Mr. Copado reports that he does not drink alcohol.   Review of Systems CONSTITUTIONAL: No weight loss, fever, chills, weakness or fatigue.  HEENT: Eyes: No visual loss, blurred vision, double vision or yellow sclerae.No hearing loss, sneezing, congestion, runny nose or sore throat.  SKIN: No rash or itching.  CARDIOVASCULAR: per HPI RESPIRATORY: No shortness of breath, cough or sputum.  GASTROINTESTINAL: No anorexia, nausea, vomiting or diarrhea. No abdominal pain or blood.  GENITOURINARY: No burning on urination, no polyuria NEUROLOGICAL: No headache, dizziness, syncope, paralysis, ataxia, numbness or tingling in the extremities. No change in bowel or bladder control.  MUSCULOSKELETAL: No muscle, back pain, joint pain or stiffness.  LYMPHATICS: No enlarged nodes. No history of splenectomy.  PSYCHIATRIC: No history of depression or anxiety.  ENDOCRINOLOGIC: No reports of sweating, cold or heat intolerance. No polyuria or polydipsia.  Marland Kitchen   Physical Examination Filed Vitals:   08/23/15 1444  BP: 119/75  Pulse: 66   Filed Weights     08/23/15 1444  Weight: 170 lb (77.111 kg)    Gen: resting comfortably, no acute distress HEENT: no scleral icterus, pupils equal round and reactive, no palptable cervical adenopathy,  CV: RRR, no m/r/g, no jvd Resp: Clear to auscultation bilaterally GI: abdomen is soft, non-tender, non-distended, normal bowel sounds, no hepatosplenomegaly MSK: extremities are warm, no edema.  Skin: warm, no rash Neuro:  no focal deficits Psych: appropriate affect   Diagnostic Studies  08/23/15 Clinic EKG (performed and reviewed in clinic): A sensed, V-paced  Assessment and Plan  1. Complete heart block  - no current symptoms, pacemaker with normal function at last check - continue regular follow ups with device clinic  2. HTN  - at goal, we will continue current meds  3. Hyperlipidemia - reqeust labs from pcp - continue statin   F/u 1 year      Antoine Poche, M.D.

## 2015-08-29 ENCOUNTER — Ambulatory Visit (INDEPENDENT_AMBULATORY_CARE_PROVIDER_SITE_OTHER): Payer: Medicaid Other | Admitting: Internal Medicine

## 2015-08-29 ENCOUNTER — Encounter: Payer: Self-pay | Admitting: Internal Medicine

## 2015-08-29 VITALS — BP 128/72 | HR 67 | Ht 65.0 in | Wt 159.0 lb

## 2015-08-29 DIAGNOSIS — I10 Essential (primary) hypertension: Secondary | ICD-10-CM

## 2015-08-29 DIAGNOSIS — I442 Atrioventricular block, complete: Secondary | ICD-10-CM

## 2015-08-29 LAB — CUP PACEART INCLINIC DEVICE CHECK
Battery Impedance: 233 Ohm
Brady Statistic AP VP Percent: 19 %
Brady Statistic AP VS Percent: 0 %
Brady Statistic AS VP Percent: 81 %
Brady Statistic AS VS Percent: 0 %
Date Time Interrogation Session: 20170228160749
Implantable Lead Location: 753859
Implantable Lead Location: 753860
Lead Channel Impedance Value: 409 Ohm
Lead Channel Impedance Value: 547 Ohm
Lead Channel Pacing Threshold Amplitude: 0.625 V
Lead Channel Pacing Threshold Amplitude: 1 V
Lead Channel Pacing Threshold Pulse Width: 0.4 ms
Lead Channel Pacing Threshold Pulse Width: 0.4 ms
Lead Channel Pacing Threshold Pulse Width: 0.4 ms
Lead Channel Setting Pacing Pulse Width: 0.4 ms
MDC IDC LEAD IMPLANT DT: 20040709
MDC IDC LEAD IMPLANT DT: 20040709
MDC IDC MSMT BATTERY REMAINING LONGEVITY: 102 mo
MDC IDC MSMT BATTERY VOLTAGE: 2.8 V
MDC IDC MSMT LEADCHNL RA PACING THRESHOLD AMPLITUDE: 0.75 V
MDC IDC MSMT LEADCHNL RA SENSING INTR AMPL: 4 mV
MDC IDC MSMT LEADCHNL RV PACING THRESHOLD AMPLITUDE: 1.375 V
MDC IDC MSMT LEADCHNL RV PACING THRESHOLD PULSEWIDTH: 0.4 ms
MDC IDC SET LEADCHNL RA PACING AMPLITUDE: 2 V
MDC IDC SET LEADCHNL RV PACING AMPLITUDE: 2.5 V
MDC IDC SET LEADCHNL RV SENSING SENSITIVITY: 5.6 mV

## 2015-08-29 NOTE — Patient Instructions (Signed)
Your physician recommends that you continue on your current medications as directed. Please refer to the Current Medication list given to you today. Device check 11/28/15. Your physician recommends that you schedule a follow-up appointment in: 1 year with Dr. Allred. Please schedule this appointment today. 

## 2015-08-29 NOTE — Progress Notes (Signed)
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  Since his last office visit, the patient reports doing very well.  SOB is stable.  Though he does not smoke tobacco, he continues to use marijuana.  His primary concern is with chronic back pain.  Today, he denies symptoms of palpitations, chest pain,  lower extremity edema, dizziness, presyncope, or syncope.  The patient is otherwise without complaint today.   Past Medical History  Diagnosis Date  . Complete heart block [facility])     with prior syncope s/p PPM 2004 in [place] (MDT Kappa);  08/16/2011 PPM Gen. Change: MDT Adapta L ADDDR1, [id number] H.  . HTN (hypertension), benign   . Syncope     a.  2001 - normal cath;  b. 2012 Normal Myoview, EF 58%.  Marland Kitchen Anxiety   . HLD (hyperlipidemia)   . DDD (degenerative disc disease)     with multiple prior back surgeries   . Complication of anesthesia 05/2011    had difficulty breathing after surgery=C8 nerve root compression  . Asthma     hasn't used inhaler at least 4-5 months.  . Pneumonia last 1998    has had 8x  . Hepatitis     Hx Hepatitis A & B  . GERD (gastroesophageal reflux disease)   . PONV (postoperative nausea and vomiting)   . Pacemaker   . Shortness of breath    Past Surgical History  Procedure Laterality Date  . Cervical neck surgeries    . Pacemaker insertion      Implanted 2004 in [place]  . Cardiac catheterization  2001    no significant CAD  . Insert / replace / remove pacemaker  08/2011    initially inserted 12/2002  . Shoulder arthroscopy distal clavicle excision and open rotator cuff repair  2009    right  . Shoulder arthroscopy distal clavicle excision and open rotator cuff repair  2010    Left  . Carpal tunnel release  1979  . Ulnar tunnel release  1980's(late) or early 1990's  . Pacemaker generator change  08/16/11    Medtronic Adapta L implanted by Dr Johney Frame, lead  implangted 2004 in [place]  . Back surgery  08/2009, 09/2011  . Pacemaker generator change Left 08/16/2011    Procedure: PACEMAKER GENERATOR CHANGE;  Surgeon: Hillis Range, MD;  Location: [facility] CATH LAB;  Service: Cardiovascular;  Laterality: Left;    Current Outpatient Prescriptions  Medication Sig Dispense Refill  . albuterol (PROVENTIL HFA;VENTOLIN HFA) 108 (90 BASE) MCG/ACT inhaler Inhale 2 puffs into the lungs every 6 (six) hours as needed. For shortness of breath    . amLODipine (NORVASC) 10 MG tablet Take 5 mg by mouth daily.     . Ascorbic Acid (VITAMIN C) 1000 MG tablet Take 1,000 mg by mouth daily.      Marland Kitchen atorvastatin (LIPITOR) 40 MG tablet TAKE ONE TABLET BY MOUTH ONCE DAILY 30 tablet 3  . Cholecalciferol (VITAMIN D) 2000 UNITS CAPS Take 1 capsule by mouth daily.    Marland Kitchen co-enzyme Q-10 30 MG capsule Take 30 mg by mouth daily.    Marland Kitchen desonide (DESOWEN) 0.05 % cream Apply 1 application topically 2 (two) times daily as needed. For flare ups    . diazepam (VALIUM) 10 MG tablet Take 10 mg by mouth every 8 (eight) hours as needed. For anxiety    . Flaxseed, Linseed, (FLAX SEED OIL) 1000  MG CAPS Take 4,000 mg by mouth daily.    . hydrochlorothiazide (HYDRODIURIL) 25 MG tablet Take 1 tablet (25 mg total) by mouth daily. 30 tablet 6  . HYDROcodone-acetaminophen (NORCO) 10-325 MG per tablet Take 1 tablet by mouth every 8 (eight) hours as needed. For pain    . HYDROmorphone (DILAUDID) 4 MG tablet Take 4 mg by mouth every 6 (six) hours as needed for pain.    Marland Kitchen ketoconazole (NIZORAL) 2 % cream Apply 1 application topically daily as needed for irritation.    . Multiple Vitamin (MULTIVITAMIN) tablet Take 1 tablet by mouth daily.      Marland Kitchen omeprazole (PRILOSEC) 20 MG capsule Take 20 mg by mouth daily.    Marland Kitchen PARoxetine (PAXIL) 20 MG tablet Take 10 mg by mouth daily.     No current facility-administered medications for this visit.   ROS- all systems are reviewed and negative as per HPI above  Physical  Exam: Filed Vitals:   08/29/15 1104  BP: 128/72  Pulse: 67  Height:  (1.651 m)  Weight: 159 lb (72.122 kg)  SpO2: 98%    GEN- The patient is well appearing, alert and oriented x 3 today.   Head- normocephalic, atraumatic Eyes-  Sclera clear, conjunctiva pink Ears- hearing intact Oropharynx- clear Lungs- Clear to ausculation bilaterally, normal work of breathing Chest- pacemaker pocket is well healed Heart- Regular rate and rhythm, no murmurs, rubs or gallops, PMI not laterally displaced GI- soft, NT, ND, + BS Extremities- no clubbing, cyanosis, or edema MS- wearing a back brace today and walking slowly  Pacemaker interrogation- reviewed in detail today,  See PACEART report  Assessment and Plan:  1. Complete heart block Normal pacemaker function See Pace Art report No changes today  2. HTN Stable No change required today  3. Marijuana Cessation advised  [facility] every 3 months I will see again in 1 year  Hillis Range MD, [facility] 08/29/2015 11:39 AM

## 2015-09-01 ENCOUNTER — Encounter: Payer: Medicaid Other | Admitting: Internal Medicine

## 2015-09-03 ENCOUNTER — Other Ambulatory Visit: Payer: Self-pay | Admitting: Cardiology

## 2015-11-13 ENCOUNTER — Other Ambulatory Visit: Payer: Self-pay | Admitting: Cardiology

## 2015-11-16 ENCOUNTER — Other Ambulatory Visit: Payer: Self-pay | Admitting: Neurosurgery

## 2015-11-16 ENCOUNTER — Other Ambulatory Visit (HOSPITAL_COMMUNITY): Payer: Self-pay | Admitting: Neurosurgery

## 2015-11-16 DIAGNOSIS — M546 Pain in thoracic spine: Secondary | ICD-10-CM

## 2015-11-16 DIAGNOSIS — M5023 Other cervical disc displacement, cervicothoracic region: Secondary | ICD-10-CM

## 2015-11-28 ENCOUNTER — Telehealth: Payer: Self-pay | Admitting: Cardiology

## 2015-11-28 ENCOUNTER — Ambulatory Visit (INDEPENDENT_AMBULATORY_CARE_PROVIDER_SITE_OTHER): Payer: Medicaid Other | Admitting: *Deleted

## 2015-11-28 DIAGNOSIS — I442 Atrioventricular block, complete: Secondary | ICD-10-CM | POA: Diagnosis not present

## 2015-11-28 NOTE — Telephone Encounter (Signed)
LMOVM reminding pt to send remote transmission.   

## 2015-12-04 ENCOUNTER — Other Ambulatory Visit: Payer: Self-pay | Admitting: Cardiology

## 2015-12-04 NOTE — Progress Notes (Signed)
Remote pacemaker transmission.   

## 2015-12-05 ENCOUNTER — Other Ambulatory Visit (HOSPITAL_COMMUNITY): Payer: Self-pay | Admitting: Neurosurgery

## 2015-12-05 DIAGNOSIS — M5023 Other cervical disc displacement, cervicothoracic region: Secondary | ICD-10-CM

## 2015-12-05 DIAGNOSIS — M546 Pain in thoracic spine: Secondary | ICD-10-CM

## 2015-12-07 ENCOUNTER — Ambulatory Visit (HOSPITAL_COMMUNITY)
Admission: RE | Admit: 2015-12-07 | Discharge: 2015-12-07 | Disposition: A | Discharge status: Home / Self Care | Payer: Medicaid Other | Source: Ambulatory Visit | Attending: Neurosurgery | Admitting: Neurosurgery

## 2015-12-07 DIAGNOSIS — M5124 Other intervertebral disc displacement, thoracic region: Secondary | ICD-10-CM | POA: Insufficient documentation

## 2015-12-07 DIAGNOSIS — Z981 Arthrodesis status: Secondary | ICD-10-CM | POA: Insufficient documentation

## 2015-12-07 DIAGNOSIS — M4804 Spinal stenosis, thoracic region: Secondary | ICD-10-CM | POA: Insufficient documentation

## 2015-12-07 DIAGNOSIS — M5023 Other cervical disc displacement, cervicothoracic region: Secondary | ICD-10-CM

## 2015-12-07 DIAGNOSIS — M4806 Spinal stenosis, lumbar region: Secondary | ICD-10-CM | POA: Insufficient documentation

## 2015-12-07 DIAGNOSIS — M2578 Osteophyte, vertebrae: Secondary | ICD-10-CM | POA: Insufficient documentation

## 2015-12-07 DIAGNOSIS — N281 Cyst of kidney, acquired: Secondary | ICD-10-CM | POA: Insufficient documentation

## 2015-12-07 DIAGNOSIS — M5126 Other intervertebral disc displacement, lumbar region: Secondary | ICD-10-CM | POA: Diagnosis not present

## 2015-12-07 DIAGNOSIS — M419 Scoliosis, unspecified: Secondary | ICD-10-CM | POA: Diagnosis not present

## 2015-12-07 DIAGNOSIS — Z950 Presence of cardiac pacemaker: Secondary | ICD-10-CM | POA: Diagnosis not present

## 2015-12-07 DIAGNOSIS — M4802 Spinal stenosis, cervical region: Secondary | ICD-10-CM | POA: Diagnosis not present

## 2015-12-07 DIAGNOSIS — M5033 Other cervical disc degeneration, cervicothoracic region: Secondary | ICD-10-CM | POA: Insufficient documentation

## 2015-12-07 DIAGNOSIS — I77810 Thoracic aortic ectasia: Secondary | ICD-10-CM | POA: Insufficient documentation

## 2015-12-07 DIAGNOSIS — M546 Pain in thoracic spine: Secondary | ICD-10-CM

## 2015-12-07 MED ORDER — HYDROMORPHONE HCL 2 MG PO TABS
2.0000 mg | ORAL_TABLET | ORAL | Status: DC | PRN
  Filled 2015-12-07: qty 2

## 2015-12-07 MED ORDER — DIAZEPAM 5 MG PO TABS
ORAL_TABLET | ORAL | Status: AC
  Filled 2015-12-07: qty 2

## 2015-12-07 MED ORDER — LIDOCAINE HCL (PF) 1 % IJ SOLN
INTRAMUSCULAR | Status: AC
  Administered 2015-12-07: 5 mL via INTRADERMAL
  Filled 2015-12-07: qty 5

## 2015-12-07 MED ORDER — IOHEXOL 300 MG/ML  SOLN
10.0000 mL | Freq: Once | INTRAMUSCULAR | Status: AC | PRN
  Administered 2015-12-07: 10 mL via INTRATHECAL

## 2015-12-07 MED ORDER — LIDOCAINE HCL (PF) 1 % IJ SOLN
5.0000 mL | Freq: Once | INTRAMUSCULAR | Status: DC
  Administered 2015-12-07: 5 mL via INTRADERMAL

## 2015-12-07 MED ORDER — ONDANSETRON HCL 4 MG/2ML IJ SOLN: 4.0000 mg | Freq: Four times a day (QID) | INTRAMUSCULAR | Status: DC | PRN

## 2015-12-07 MED ORDER — IOPAMIDOL (ISOVUE-M 200) INJECTION 41%
INTRAMUSCULAR | Status: AC
  Filled 2015-12-07: qty 10

## 2015-12-07 MED ORDER — DIAZEPAM 5 MG PO TABS
10.0000 mg | ORAL_TABLET | Freq: Once | ORAL | Status: DC
  Filled 2015-12-07: qty 2

## 2015-12-07 NOTE — Procedures (Signed)
Lumbar myelogram with puncture L 12 level with 5cc Omnipaque 300.  No complications.

## 2015-12-07 NOTE — Discharge Instructions (Signed)

## 2015-12-12 LAB — CUP PACEART REMOTE DEVICE CHECK
Battery Voltage: 2.79 V
Brady Statistic AP VP Percent: 21 %
Brady Statistic AP VS Percent: 0 %
Brady Statistic AS VP Percent: 79 %
Implantable Lead Implant Date: 20040709
Implantable Lead Implant Date: 20040709
Implantable Lead Location: 753860
Lead Channel Impedance Value: 433 Ohm
Lead Channel Setting Pacing Amplitude: 2 V
Lead Channel Setting Pacing Amplitude: 2.75 V
Lead Channel Setting Pacing Pulse Width: 0.4 ms
Lead Channel Setting Sensing Sensitivity: 5.6 mV
MDC IDC LEAD LOCATION: 753859
MDC IDC MSMT BATTERY IMPEDANCE: 257 Ohm
MDC IDC MSMT BATTERY REMAINING LONGEVITY: 99 mo
MDC IDC MSMT LEADCHNL RV IMPEDANCE VALUE: 537 Ohm
MDC IDC SESS DTM: 20170531210519
MDC IDC STAT BRADY AS VS PERCENT: 0 %

## 2015-12-14 ENCOUNTER — Other Ambulatory Visit: Payer: Self-pay | Admitting: Neurosurgery

## 2015-12-19 ENCOUNTER — Encounter: Payer: Self-pay | Admitting: Cardiology

## 2016-01-03 ENCOUNTER — Telehealth: Payer: Self-pay | Admitting: *Deleted

## 2016-01-03 NOTE — Telephone Encounter (Signed)
Pt called requesting surgical clearance - spine surgery details in EPIC - scheduled for 01/12/16 with Dr. Venetia MaxonStern.

## 2016-01-04 ENCOUNTER — Encounter: Payer: Self-pay | Admitting: Cardiology

## 2016-01-04 NOTE — Telephone Encounter (Signed)
I have not seen him in a few months and would like to discuss the surgery with him. Can we put him in Wed at 340pm for next week please   J Adiba Fargnoli MD

## 2016-01-05 ENCOUNTER — Encounter: Payer: Self-pay | Admitting: Cardiology

## 2016-01-05 NOTE — Telephone Encounter (Signed)
Waiting for surgical clearance form from hospital about pt PPM.

## 2016-01-05 NOTE — Telephone Encounter (Signed)
Pt agreeable to come in. Also requested I send this to device clinic for surgical clearance.

## 2016-01-11 ENCOUNTER — Ambulatory Visit: Payer: Medicaid Other | Admitting: Cardiology

## 2016-01-28 ENCOUNTER — Other Ambulatory Visit: Payer: Self-pay | Admitting: Cardiology

## 2016-02-01 ENCOUNTER — Ambulatory Visit: Payer: Medicaid Other | Admitting: Cardiology

## 2016-02-01 NOTE — Progress Notes (Deleted)
Clinical Summary [name] is a 65 y.o.male seen today for follow up of the following medical problems.   1. Complete heart block  - s/p pacemaker placement 2004  - normal device check 05/2015  - denies any significant lightheadedness or dizziness, no palps, no syncope   2. HTN  - checks bp twice a day. Typically 90-100s/60s-70s. - compliant with meds  3. HL  - compliant w/ statin, denies any side effects - reports recent panel earlier this month with his pcp.  4. Preoperative evaluatio - being considered for back surgery -  Past Medical History:  Diagnosis Date  . Anxiety   . Asthma    hasn't used inhaler at least 4-5 months.  . Complete heart block [facility])    with prior syncope s/p PPM 2004 in [place] (MDT Kappa);  08/16/2011 PPM Gen. Change: MDT Adapta L ADDDR1, [id number] H.  . Complication of anesthesia 05/2011   had difficulty breathing after surgery=C8 nerve root compression  . DDD (degenerative disc disease)    with multiple prior back surgeries   . GERD (gastroesophageal reflux disease)   . Hepatitis    Hx Hepatitis A & B  . HLD (hyperlipidemia)   . HTN (hypertension), benign   . Pacemaker   . Pneumonia last 1998   has had 8x  . PONV (postoperative nausea and vomiting)   . Shortness of breath   . Syncope    a.  2001 - normal cath;  b. 2012 Normal Myoview, EF 58%.     Allergies  Allergen Reactions  . Other Other (See Comments)    Nuclear Stress Medicine- made him feel" out of touch" w/ reality.  . Adhesive [Tape] Other (See Comments)    Pulls skin off, Please use "paper" tape  . Prednisone Other (See Comments)    REACTION: pain in joints High Doses  . Codeine Rash    REACTION: rash,itch     Current Outpatient Prescriptions  Medication Sig Dispense Refill  . albuterol (PROVENTIL HFA;VENTOLIN HFA) 108 (90 BASE) MCG/ACT inhaler Inhale 2 puffs into the lungs every 6 (six) hours as needed. For shortness of breath    . amLODipine (NORVASC)  10 MG tablet Take 5 mg by mouth daily.     . Ascorbic Acid (VITAMIN C) 1000 MG tablet Take 1,000 mg by mouth daily.      Marland Kitchen atorvastatin (LIPITOR) 40 MG tablet TAKE ONE TABLET BY MOUTH ONCE DAILY 30 tablet 6  . Cholecalciferol (VITAMIN D) 2000 UNITS CAPS Take 2,000 Units by mouth daily.     Marland Kitchen co-enzyme Q-10 30 MG capsule Take 30 mg by mouth daily.    Marland Kitchen desonide (DESOWEN) 0.05 % cream Apply 1 application topically 2 (two) times daily as needed. For flare ups    . diazepam (VALIUM) 10 MG tablet Take 10 mg by mouth every 8 (eight) hours as needed. For anxiety    . Flaxseed, Linseed, (FLAX SEED OIL) 1000 MG CAPS Take 4,000 mg by mouth daily.    . hydrochlorothiazide (HYDRODIURIL) 25 MG tablet TAKE ONE TABLET BY MOUTH ONCE DAILY 30 tablet 6  . HYDROcodone-acetaminophen (NORCO) 10-325 MG per tablet Take 1 tablet by mouth every 8 (eight) hours as needed for moderate pain. For pain    . HYDROmorphone (DILAUDID) 4 MG tablet Take 4 mg by mouth 2 (two) times daily.     Marland Kitchen ketoconazole (NIZORAL) 2 % cream Apply 1 application topically daily as needed for irritation.    Marland Kitchen  Menthol, Topical Analgesic, (BLUE-EMU MAXIMUM STRENGTH) 2.5 % LIQD Apply 1 application topically 3 (three) times daily as needed (for muscle pain).    . Multiple Vitamin (MULTIVITAMIN) tablet Take 1 tablet by mouth daily.      Marland Kitchen omeprazole (PRILOSEC) 20 MG capsule Take 20 mg by mouth 2 (two) times daily as needed (for heartburn or acid reflux).     Marland Kitchen PARoxetine (PAXIL) 20 MG tablet Take 20 mg by mouth daily.     . vitamin E 1000 UNIT capsule Take 1,000 Units by mouth daily.     No current facility-administered medications for this visit.      Past Surgical History:  Procedure Laterality Date  . BACK SURGERY  08/2009, 09/2011  . CARDIAC CATHETERIZATION  2001   no significant CAD  . CARPAL TUNNEL RELEASE  1979  . cervical neck surgeries    . INSERT / REPLACE / REMOVE PACEMAKER  08/2011   initially inserted 12/2002  . pacemaker generator  change  08/16/11   Medtronic Adapta L implanted by Dr Johney Frame, lead implangted 2004 in [place]  . PACEMAKER GENERATOR CHANGE Left 08/16/2011   Procedure: PACEMAKER GENERATOR CHANGE;  Surgeon: Hillis Range, MD;  Location: [facility] CATH LAB;  Service: Cardiovascular;  Laterality: Left;  . PACEMAKER INSERTION     Implanted 2004 in [place]  . SHOULDER ARTHROSCOPY DISTAL CLAVICLE EXCISION AND OPEN ROTATOR CUFF REPAIR  2009   right  . SHOULDER ARTHROSCOPY DISTAL CLAVICLE EXCISION AND OPEN ROTATOR CUFF REPAIR  2010   Left  . ULNAR TUNNEL RELEASE  1980's(late) or early 1990's     Allergies  Allergen Reactions  . Other Other (See Comments)    Nuclear Stress Medicine- made him feel" out of touch" w/ reality.  . Adhesive [Tape] Other (See Comments)    Pulls skin off, Please use "paper" tape  . Prednisone Other (See Comments)    REACTION: pain in joints High Doses  . Codeine Rash    REACTION: rash,itch      Family History  Problem Relation Age of Onset  . Diabetes Neg Hx   . Hypertension Neg Hx   . Coronary artery disease Neg Hx      Social History [name] reports that he quit smoking about 38 years ago. His smoking use included Cigarettes. He started smoking about 47 years ago. He has a 6.00 pack-year smoking history. He has never used smokeless tobacco. [name] reports that he does not drink alcohol.   Review of Systems CONSTITUTIONAL: No weight loss, fever, chills, weakness or fatigue.  HEENT: Eyes: No visual loss, blurred vision, double vision or yellow sclerae.No hearing loss, sneezing, congestion, runny nose or sore throat.  SKIN: No rash or itching.  CARDIOVASCULAR:  RESPIRATORY: No shortness of breath, cough or sputum.  GASTROINTESTINAL: No anorexia, nausea, vomiting or diarrhea. No abdominal pain or blood.  GENITOURINARY: No burning on urination, no polyuria NEUROLOGICAL: No headache, dizziness, syncope, paralysis, ataxia, numbness or tingling in the extremities. No  change in bowel or bladder control.  MUSCULOSKELETAL: No muscle, back pain, joint pain or stiffness.  LYMPHATICS: No enlarged nodes. No history of splenectomy.  PSYCHIATRIC: No history of depression or anxiety.  ENDOCRINOLOGIC: No reports of sweating, cold or heat intolerance. No polyuria or polydipsia.  Marland Kitchen   Physical Examination There were no vitals filed for this visit. There were no vitals filed for this visit.  Gen: resting comfortably, no acute distress HEENT: no scleral icterus, pupils equal round and reactive, no  palptable cervical adenopathy,  CV Resp: Clear to auscultation bilaterally GI: abdomen is soft, non-tender, non-distended, normal bowel sounds, no hepatosplenomegaly MSK: extremities are warm, no edema.  Skin: warm, no rash Neuro:  no focal deficits Psych: appropriate affect   Diagnostic Studies     Assessment and Plan  1. Complete heart block  - no current symptoms, pacemaker with normal function at last check - continue regular follow ups with device clinic  2. HTN  - at goal, we will continue current meds  3. Hyperlipidemia - reqeust labs from pcp - continue statin   F/u 1 year      Antoine Poche, M.D., F.A.C.C.

## 2016-02-16 ENCOUNTER — Inpatient Hospital Stay (HOSPITAL_COMMUNITY): Admission: RE | Admit: 2016-02-16 | Payer: Medicaid Other | Source: Ambulatory Visit | Admitting: Neurosurgery

## 2016-02-16 ENCOUNTER — Encounter (HOSPITAL_COMMUNITY): Admission: RE | Payer: Self-pay | Source: Ambulatory Visit

## 2016-02-16 SURGERY — POSTERIOR LUMBAR FUSION 1 LEVEL
Anesthesia: General | Site: Back

## 2016-02-22 ENCOUNTER — Ambulatory Visit: Payer: Medicaid Other | Admitting: Cardiology

## 2016-03-06 ENCOUNTER — Ambulatory Visit (INDEPENDENT_AMBULATORY_CARE_PROVIDER_SITE_OTHER): Payer: Medicaid Other | Admitting: *Deleted

## 2016-03-06 ENCOUNTER — Telehealth: Payer: Self-pay | Admitting: Cardiology

## 2016-03-06 DIAGNOSIS — I442 Atrioventricular block, complete: Secondary | ICD-10-CM

## 2016-03-06 NOTE — Telephone Encounter (Signed)
Spoke with pt and reminded pt of remote transmission that is due today. Pt verbalized understanding.   

## 2016-03-06 NOTE — Progress Notes (Signed)
Remote pacemaker transmission.   

## 2016-03-08 ENCOUNTER — Encounter: Payer: Self-pay | Admitting: Cardiology

## 2016-03-16 LAB — CUP PACEART REMOTE DEVICE CHECK
Brady Statistic AP VP Percent: 20 %
Brady Statistic AP VS Percent: 0 %
Brady Statistic AS VP Percent: 80 %
Brady Statistic AS VS Percent: 0 %
Implantable Lead Implant Date: 20040709
Implantable Lead Location: 753859
Implantable Lead Model: 5076
Lead Channel Impedance Value: 510 Ohm
Lead Channel Pacing Threshold Amplitude: 0.625 V
Lead Channel Pacing Threshold Amplitude: 1.125 V
Lead Channel Pacing Threshold Pulse Width: 0.4 ms
Lead Channel Sensing Intrinsic Amplitude: 2.8 mV
Lead Channel Setting Pacing Pulse Width: 0.4 ms
MDC IDC LEAD IMPLANT DT: 20040709
MDC IDC LEAD LOCATION: 753860
MDC IDC MSMT BATTERY IMPEDANCE: 282 Ohm
MDC IDC MSMT BATTERY REMAINING LONGEVITY: 100 mo
MDC IDC MSMT BATTERY VOLTAGE: 2.79 V
MDC IDC MSMT LEADCHNL RA IMPEDANCE VALUE: 394 Ohm
MDC IDC MSMT LEADCHNL RV PACING THRESHOLD PULSEWIDTH: 0.4 ms
MDC IDC SESS DTM: 20170906172637
MDC IDC SET LEADCHNL RA PACING AMPLITUDE: 2 V
MDC IDC SET LEADCHNL RV PACING AMPLITUDE: 2.5 V
MDC IDC SET LEADCHNL RV SENSING SENSITIVITY: 5.6 mV

## 2016-03-22 ENCOUNTER — Encounter: Payer: Self-pay | Admitting: Cardiology

## 2016-03-29 ENCOUNTER — Ambulatory Visit: Payer: Medicaid Other | Admitting: Cardiology

## 2016-03-29 NOTE — Progress Notes (Deleted)
Clinical Summary Mr. Gamino is a 65 y.o.male seen today for follow up of the following medical problems.   1. Complete heart block  - s/p pacemaker placement 2004  - normal device check 05/2015  - denies any significant lightheadedness or dizziness, no palps, no syncope   2. HTN  - checks bp twice a day. Typically 90-100s/60s-70s. - compliant with meds  3. HL  - compliant w/ statin, denies any side effects - reports recent panel earlier this month with his pcp.  4. Preoperative cardiac evaluation - being considered for back surgery -  Past Medical History:  Diagnosis Date  . Anxiety   . Asthma    hasn't used inhaler at least 4-5 months.  . Complete heart block [facility])    with prior syncope s/p PPM 2004 in [place] (MDT Kappa);  08/16/2011 PPM Gen. Change: MDT Adapta L ADDDR1, [id number] H.  . Complication of anesthesia 05/2011   had difficulty breathing after surgery=C8 nerve root compression  . DDD (degenerative disc disease)    with multiple prior back surgeries   . GERD (gastroesophageal reflux disease)   . Hepatitis    Hx Hepatitis A & B  . HLD (hyperlipidemia)   . HTN (hypertension), benign   . Pacemaker   . Pneumonia last 1998   has had 8x  . PONV (postoperative nausea and vomiting)   . Shortness of breath   . Syncope    a.  2001 - normal cath;  b. 2012 Normal Myoview, EF 58%.     Allergies  Allergen Reactions  . Other Other (See Comments)    Nuclear Stress Medicine- made him feel" out of touch" w/ reality.  . Adhesive [Tape] Other (See Comments)    Pulls skin off, Please use "paper" tape  . Prednisone Other (See Comments)    REACTION: pain in joints High Doses  . Codeine Rash    REACTION: rash,itch     Current Outpatient Prescriptions  Medication Sig Dispense Refill  . albuterol (PROVENTIL HFA;VENTOLIN HFA) 108 (90 BASE) MCG/ACT inhaler Inhale 2 puffs into the lungs every 6 (six) hours as needed. For shortness of breath    . amLODipine  (NORVASC) 10 MG tablet Take 5 mg by mouth daily.     . Ascorbic Acid (VITAMIN C) 1000 MG tablet Take 1,000 mg by mouth daily.      Marland Kitchen atorvastatin (LIPITOR) 40 MG tablet TAKE ONE TABLET BY MOUTH ONCE DAILY 30 tablet 6  . Cholecalciferol (VITAMIN D) 2000 UNITS CAPS Take 2,000 Units by mouth daily.     Marland Kitchen co-enzyme Q-10 30 MG capsule Take 30 mg by mouth daily.    Marland Kitchen desonide (DESOWEN) 0.05 % cream Apply 1 application topically 2 (two) times daily as needed. For flare ups    . diazepam (VALIUM) 10 MG tablet Take 10 mg by mouth every 8 (eight) hours as needed. For anxiety    . Flaxseed, Linseed, (FLAX SEED OIL) 1000 MG CAPS Take 4,000 mg by mouth daily.    . hydrochlorothiazide (HYDRODIURIL) 25 MG tablet TAKE ONE TABLET BY MOUTH ONCE DAILY 30 tablet 6  . HYDROcodone-acetaminophen (NORCO) 10-325 MG per tablet Take 1 tablet by mouth every 8 (eight) hours as needed for moderate pain. For pain    . HYDROmorphone (DILAUDID) 4 MG tablet Take 4 mg by mouth 2 (two) times daily.     Marland Kitchen ketoconazole (NIZORAL) 2 % cream Apply 1 application topically daily as needed for irritation.    Marland Kitchen  Menthol, Topical Analgesic, (BLUE-EMU MAXIMUM STRENGTH) 2.5 % LIQD Apply 1 application topically 3 (three) times daily as needed (for muscle pain).    . Multiple Vitamin (MULTIVITAMIN) tablet Take 1 tablet by mouth daily.      Marland Kitchen. omeprazole (PRILOSEC) 20 MG capsule Take 20 mg by mouth 2 (two) times daily as needed (for heartburn or acid reflux).     Marland Kitchen. PARoxetine (PAXIL) 20 MG tablet Take 20 mg by mouth daily.     . vitamin E 1000 UNIT capsule Take 1,000 Units by mouth daily.     No current facility-administered medications for this visit.      Past Surgical History:  Procedure Laterality Date  . BACK SURGERY  08/2009, 09/2011  . CARDIAC CATHETERIZATION  2001   no significant CAD  . CARPAL TUNNEL RELEASE  1979  . cervical neck surgeries    . INSERT / REPLACE / REMOVE PACEMAKER  08/2011   initially inserted 12/2002  . pacemaker  generator change  08/16/11   Medtronic Adapta L implanted by Dr Johney FrameAllred, lead implangted 2004 in [place]  . PACEMAKER GENERATOR CHANGE Left 08/16/2011   Procedure: PACEMAKER GENERATOR CHANGE;  Surgeon: Hillis RangeJames Allred, MD;  [facility];  Service: Cardiovascular;  Laterality: Left;  . PACEMAKER INSERTION     Implanted 2004 in [place]  . SHOULDER ARTHROSCOPY DISTAL CLAVICLE EXCISION AND OPEN ROTATOR CUFF REPAIR  2009   right  . SHOULDER ARTHROSCOPY DISTAL CLAVICLE EXCISION AND OPEN ROTATOR CUFF REPAIR  2010   Left  . ULNAR TUNNEL RELEASE  1980's(late) or early 1990's     Allergies  Allergen Reactions  . Other Other (See Comments)    Nuclear Stress Medicine- made him feel" out of touch" w/ reality.  . Adhesive [Tape] Other (See Comments)    Pulls skin off, Please use "paper" tape  . Prednisone Other (See Comments)    REACTION: pain in joints High Doses  . Codeine Rash    REACTION: rash,itch      Family History  Problem Relation Age of Onset  . Diabetes Neg Hx   . Hypertension Neg Hx   . Coronary artery disease Neg Hx      Social History Mr. Annice NeedyGauldin reports that he quit smoking about 38 years ago. His smoking use included Cigarettes. He started smoking about 47 years ago. He has a 6.00 pack-year smoking history. He has never used smokeless tobacco. Mr. Annice NeedyGauldin reports that he does not drink alcohol.   Review of Systems CONSTITUTIONAL: No weight loss, fever, chills, weakness or fatigue.  HEENT: Eyes: No visual loss, blurred vision, double vision or yellow sclerae.No hearing loss, sneezing, congestion, runny nose or sore throat.  SKIN: No rash or itching.  CARDIOVASCULAR:  RESPIRATORY: No shortness of breath, cough or sputum.  GASTROINTESTINAL: No anorexia, nausea, vomiting or diarrhea. No abdominal pain or blood.  GENITOURINARY: No burning on urination, no polyuria NEUROLOGICAL: No headache, dizziness, syncope, paralysis, ataxia, numbness or tingling in the  extremities. No change in bowel or bladder control.  MUSCULOSKELETAL: No muscle, back pain, joint pain or stiffness.  LYMPHATICS: No enlarged nodes. No history of splenectomy.  PSYCHIATRIC: No history of depression or anxiety.  ENDOCRINOLOGIC: No reports of sweating, cold or heat intolerance. No polyuria or polydipsia.  Marland Kitchen.   Physical Examination There were no vitals filed for this visit. There were no vitals filed for this visit.  Gen: resting comfortably, no acute distress HEENT: no scleral icterus, pupils equal round and reactive, no  palptable cervical adenopathy,  CV Resp: Clear to auscultation bilaterally GI: abdomen is soft, non-tender, non-distended, normal bowel sounds, no hepatosplenomegaly MSK: extremities are warm, no edema.  Skin: warm, no rash Neuro:  no focal deficits Psych: appropriate affect   Diagnostic Studies     Assessment and Plan   1. Complete heart block  - no current symptoms, pacemaker with normal function at last check - continue regular follow ups with device clinic  2. HTN  - at goal, we will continue current meds  3. Hyperlipidemia - reqeust labs from pcp - continue statin   F/u 1 year     Antoine Poche, M.D., F.A.C.C.

## 2016-06-05 ENCOUNTER — Ambulatory Visit (INDEPENDENT_AMBULATORY_CARE_PROVIDER_SITE_OTHER): Payer: Medicaid Other | Admitting: *Deleted

## 2016-06-05 ENCOUNTER — Telehealth: Payer: Self-pay | Admitting: Cardiology

## 2016-06-05 DIAGNOSIS — I442 Atrioventricular block, complete: Secondary | ICD-10-CM

## 2016-06-05 LAB — CUP PACEART REMOTE DEVICE CHECK
Battery Remaining Longevity: 99 mo
Implantable Lead Implant Date: 20040709
Implantable Lead Location: 753859
Implantable Pulse Generator Implant Date: 20130215
Lead Channel Pacing Threshold Amplitude: 1.25 V
Lead Channel Pacing Threshold Pulse Width: 0.4 ms
Lead Channel Setting Pacing Amplitude: 2 V
Lead Channel Setting Pacing Pulse Width: 0.4 ms
Lead Channel Setting Sensing Sensitivity: 5.6 mV
MDC IDC LEAD IMPLANT DT: 20040709
MDC IDC LEAD LOCATION: 753860
MDC IDC MSMT BATTERY IMPEDANCE: 306 Ohm
MDC IDC MSMT BATTERY VOLTAGE: 2.79 V
MDC IDC MSMT LEADCHNL RA IMPEDANCE VALUE: 410 Ohm
MDC IDC MSMT LEADCHNL RA PACING THRESHOLD AMPLITUDE: 0.5 V
MDC IDC MSMT LEADCHNL RA PACING THRESHOLD PULSEWIDTH: 0.4 ms
MDC IDC MSMT LEADCHNL RV IMPEDANCE VALUE: 553 Ohm
MDC IDC SESS DTM: 20171207200339
MDC IDC SET LEADCHNL RV PACING AMPLITUDE: 2.5 V
MDC IDC STAT BRADY AP VP PERCENT: 16 %
MDC IDC STAT BRADY AP VS PERCENT: 0 %
MDC IDC STAT BRADY AS VP PERCENT: 84 %
MDC IDC STAT BRADY AS VS PERCENT: 0 %

## 2016-06-05 NOTE — Telephone Encounter (Signed)
Spoke with pt and reminded pt of remote transmission that is due today. Pt verbalized understanding.   

## 2016-06-07 NOTE — Progress Notes (Signed)
Remote pacemaker transmission.   

## 2016-06-12 ENCOUNTER — Encounter: Payer: Self-pay | Admitting: Cardiology

## 2016-06-26 ENCOUNTER — Encounter: Payer: Self-pay | Admitting: Cardiology

## 2016-08-27 ENCOUNTER — Other Ambulatory Visit: Payer: Self-pay | Admitting: Cardiology

## 2016-08-30 ENCOUNTER — Encounter: Payer: Medicaid Other | Admitting: Internal Medicine

## 2016-09-06 ENCOUNTER — Encounter: Payer: Medicaid Other | Admitting: Internal Medicine

## 2016-09-26 ENCOUNTER — Other Ambulatory Visit: Payer: Self-pay | Admitting: Cardiology

## 2016-10-11 ENCOUNTER — Encounter: Payer: Medicaid Other | Admitting: Internal Medicine

## 2016-11-01 ENCOUNTER — Other Ambulatory Visit: Payer: Self-pay | Admitting: Cardiology

## 2016-12-04 ENCOUNTER — Encounter: Payer: Medicaid Other | Admitting: Internal Medicine

## 2017-01-24 ENCOUNTER — Ambulatory Visit (INDEPENDENT_AMBULATORY_CARE_PROVIDER_SITE_OTHER): Payer: Medicaid Other | Admitting: Internal Medicine

## 2017-01-24 ENCOUNTER — Encounter: Payer: Self-pay | Admitting: Internal Medicine

## 2017-01-24 VITALS — BP 118/72 | HR 70 | Ht 65.0 in | Wt 168.0 lb

## 2017-01-24 DIAGNOSIS — I442 Atrioventricular block, complete: Secondary | ICD-10-CM | POA: Diagnosis not present

## 2017-01-24 DIAGNOSIS — I10 Essential (primary) hypertension: Secondary | ICD-10-CM | POA: Diagnosis not present

## 2017-01-24 LAB — CUP PACEART INCLINIC DEVICE CHECK
Battery Impedance: 380 Ohm
Battery Voltage: 2.79 V
Brady Statistic AP VS Percent: 0 %
Brady Statistic AS VP Percent: 86 %
Implantable Lead Implant Date: 20040709
Implantable Lead Location: 753859
Implantable Lead Model: 5076
Lead Channel Impedance Value: 536 Ohm
Lead Channel Pacing Threshold Amplitude: 0.625 V
Lead Channel Pacing Threshold Amplitude: 1 V
Lead Channel Pacing Threshold Pulse Width: 0.4 ms
Lead Channel Sensing Intrinsic Amplitude: 4 mV
Lead Channel Setting Pacing Amplitude: 2 V
Lead Channel Setting Sensing Sensitivity: 5.6 mV
MDC IDC LEAD IMPLANT DT: 20040709
MDC IDC LEAD LOCATION: 753860
MDC IDC MSMT BATTERY REMAINING LONGEVITY: 91 mo
MDC IDC MSMT LEADCHNL RA IMPEDANCE VALUE: 409 Ohm
MDC IDC MSMT LEADCHNL RA PACING THRESHOLD AMPLITUDE: 0.75 V
MDC IDC MSMT LEADCHNL RA PACING THRESHOLD PULSEWIDTH: 0.4 ms
MDC IDC MSMT LEADCHNL RV PACING THRESHOLD AMPLITUDE: 1.125 V
MDC IDC MSMT LEADCHNL RV PACING THRESHOLD PULSEWIDTH: 0.4 ms
MDC IDC MSMT LEADCHNL RV PACING THRESHOLD PULSEWIDTH: 0.4 ms
MDC IDC PG IMPLANT DT: 20130215
MDC IDC SESS DTM: 20180727143256
MDC IDC SET LEADCHNL RV PACING AMPLITUDE: 2.5 V
MDC IDC SET LEADCHNL RV PACING PULSEWIDTH: 0.4 ms
MDC IDC STAT BRADY AP VP PERCENT: 14 %
MDC IDC STAT BRADY AS VS PERCENT: 0 %

## 2017-01-24 NOTE — Patient Instructions (Addendum)
Medication Instructions:   Your physician recommends that you continue on your current medications as directed. Please refer to the Current Medication list given to you today.  Labwork:  NONE  Testing/Procedures:  NONE  Follow-Up: Your physician recommends that you schedule a follow-up appointment in: 1 year. Please schedule this appointment today before leaving the office.   Any Other Special Instructions Will Be Listed Below (If Applicable).  Your next device check from home is on 04/28/17.  If you need a refill on your cardiac medications before your next appointment, please call your pharmacy. 

## 2017-01-24 NOTE — Progress Notes (Signed)
PCP: Ernestine ConradBluth, Kirk, MD previously Primary Cardiologist:  Dr Garnet SierrasBranch  Jason Yu is a 66 y.o. male who presents today for routine electrophysiology followup.  Since last being seen in our clinic, the patient reports doing very well.  His primary concern remains with chronic back pain  Today, he denies symptoms of palpitations, chest pain, shortness of breath,  lower extremity edema, dizziness, presyncope, or syncope.  The patient is otherwise without complaint today.   Past Medical History:  Diagnosis Date  . Anxiety   . Asthma    hasn't used inhaler at least 4-5 months.  . Complete heart block [facility](H[facility])    with prior syncope s/p PPM 2004 in [place] (MDT Kappa);  08/16/2011 PPM Gen. Change: MDT Adapta L ADDDR1, [id number] H.  . Complication of anesthesia 05/2011   had difficulty breathing after surgery=C8 nerve root compression  . DDD (degenerative disc disease)    with multiple prior back surgeries   . GERD (gastroesophageal reflux disease)   . Hepatitis    Hx Hepatitis A & B  . HLD (hyperlipidemia)   . HTN (hypertension), benign   . Pacemaker   . Pneumonia last 1998   has had 8x  . PONV (postoperative nausea and vomiting)   . Shortness of breath   . Syncope    a.  2001 - normal cath;  b. 2012 Normal Myoview, EF 58%.   Past Surgical History:  Procedure Laterality Date  . BACK SURGERY  08/2009, 09/2011  . CARDIAC CATHETERIZATION  2001   no significant CAD  . CARPAL TUNNEL RELEASE  1979  . cervical neck surgeries    . INSERT / REPLACE / REMOVE PACEMAKER  08/2011   initially inserted 12/2002  . pacemaker generator change  08/16/11   Medtronic Adapta L implanted by Dr Johney FrameAllred, lead implangted 2004 in [place]  . PACEMAKER GENERATOR CHANGE Left 08/16/2011   Procedure: PACEMAKER GENERATOR CHANGE;  Surgeon: Hillis RangeJames Dasani Crear, MD;  [facility];  Service: Cardiovascular;  Laterality: Left;  . PACEMAKER INSERTION     Implanted 2004 in [place]  . SHOULDER ARTHROSCOPY DISTAL CLAVICLE EXCISION  AND OPEN ROTATOR CUFF REPAIR  2009   right  . SHOULDER ARTHROSCOPY DISTAL CLAVICLE EXCISION AND OPEN ROTATOR CUFF REPAIR  2010   Left  . ULNAR TUNNEL RELEASE  1980's(late) or early 1990's    ROS- all systems are reviewed and negative except as per HPI above  Current Outpatient Prescriptions  Medication Sig Dispense Refill  . albuterol (PROVENTIL HFA;VENTOLIN HFA) 108 (90 BASE) MCG/ACT inhaler Inhale 2 puffs into the lungs every 6 (six) hours as needed. For shortness of breath    . amLODipine (NORVASC) 10 MG tablet Take 5 mg by mouth daily.     . Ascorbic Acid (VITAMIN C) 1000 MG tablet Take 1,000 mg by mouth daily.      Marland Kitchen. atorvastatin (LIPITOR) 40 MG tablet TAKE 1 TABLET BY MOUTH DAILY 30 tablet 3  . Cholecalciferol (VITAMIN D) 2000 UNITS CAPS Take 2,000 Units by mouth daily.     Marland Kitchen. co-enzyme Q-10 30 MG capsule Take 30 mg by mouth daily.    Marland Kitchen. desonide (DESOWEN) 0.05 % cream Apply 1 application topically 2 (two) times daily as needed. For flare ups    . diazepam (VALIUM) 10 MG tablet Take 10 mg by mouth every 8 (eight) hours as needed. For anxiety    . Flaxseed, Linseed, (FLAX SEED OIL) 1000 MG CAPS Take 4,000 mg by mouth daily.    .Marland Kitchen  Glucosamine-Chondroit-Vit C-Mn (GLUCOSAMINE 1500 COMPLEX) CAPS Take by mouth.    . hydrochlorothiazide (HYDRODIURIL) 25 MG tablet TAKE 1 TABLET BY MOUTH DAILY 30 tablet 3  . HYDROcodone-acetaminophen (NORCO) 10-325 MG per tablet Take 1 tablet by mouth every 8 (eight) hours as needed for moderate pain. For pain    . HYDROmorphone (DILAUDID) 4 MG tablet Take 4 mg by mouth 2 (two) times daily.     Marland Kitchen. ketoconazole (NIZORAL) 2 % cream Apply 1 application topically daily as needed for irritation.    . Menthol, Topical Analgesic, (BLUE-EMU MAXIMUM STRENGTH) 2.5 % LIQD Apply 1 application topically 3 (three) times daily as needed (for muscle pain).    . Multiple Vitamin (MULTIVITAMIN) tablet Take 1 tablet by mouth daily.      Marland Kitchen. omeprazole (PRILOSEC) 20 MG capsule Take 20  mg by mouth 2 (two) times daily as needed (for heartburn or acid reflux).     Marland Kitchen. PARoxetine (PAXIL) 20 MG tablet Take 20 mg by mouth daily.     . vitamin B-12 (CYANOCOBALAMIN) 1000 MCG tablet Take 1,000 mcg by mouth daily.    . vitamin E 1000 UNIT capsule Take 1,000 Units by mouth daily.     No current facility-administered medications for this visit.     Physical Exam: Vitals:   01/24/17 1001  BP: 118/72  Pulse: 70  SpO2: 97%  Weight: 168 lb (76.2 kg)  Height: 5\' 5"  (1.651 m)    GEN- The patient is well appearing, alert and oriented x 3 today.   Head- normocephalic, atraumatic Eyes-  Sclera clear, conjunctiva pink Ears- hearing intact Oropharynx- clear Lungs- Clear to ausculation bilaterally, normal work of breathing Chest- pacemaker pocket is well healed Heart- Regular rate and rhythm, no murmurs, rubs or gallops, PMI not laterally displaced GI- soft, NT, ND, + BS Extremities- no clubbing, cyanosis, or edema  Pacemaker interrogation- reviewed in detail today,  See PACEART report  Assessment and Plan:  1. Complete heart block Normal pacemaker function See Pace Art report No changes today  2. HTN Stable No change required today  3. Marijuana Cessation advised  carelink Return to see me in a year  Hillis RangeJames Lutie Pickler MD, [facility] 01/24/2017 10:40 AM

## 2017-02-17 ENCOUNTER — Other Ambulatory Visit: Payer: Self-pay | Admitting: Cardiology

## 2017-03-03 ENCOUNTER — Other Ambulatory Visit: Payer: Self-pay | Admitting: Cardiology

## 2017-03-07 ENCOUNTER — Other Ambulatory Visit: Payer: Self-pay | Admitting: Cardiology

## 2017-03-07 MED ORDER — AMLODIPINE BESYLATE 10 MG PO TABS: 5.0000 mg | ORAL_TABLET | Freq: Every day | ORAL | 0 refills | Status: DC

## 2017-03-07 NOTE — Telephone Encounter (Signed)
[facility] - pt needs appt for refills - note sent to pharmacy as well

## 2017-03-07 NOTE — Telephone Encounter (Signed)
°*  STAT* If patient is at the pharmacy, call can be transferred to refill team.   1. Which medications need to be refilled? amLODipine (NORVASC) 10 MG tablet    2. Which pharmacy/location (including street and city if local pharmacy)  [facility]   3. Do they need a 30 day or 90 day supply?    Has September supply needs to get for October

## 2017-03-21 ENCOUNTER — Other Ambulatory Visit: Payer: Self-pay | Admitting: Cardiology

## 2017-04-04 ENCOUNTER — Other Ambulatory Visit: Payer: Self-pay | Admitting: *Deleted

## 2017-04-04 ENCOUNTER — Telehealth: Payer: Self-pay | Admitting: Cardiology

## 2017-04-04 MED ORDER — AMLODIPINE BESYLATE 10 MG PO TABS: 5.0000 mg | ORAL_TABLET | Freq: Every day | ORAL | 1 refills | Status: DC

## 2017-04-04 NOTE — Telephone Encounter (Signed)
Pt requested #90 of amlodipine sent to [facility], pt made f/u appt with Dr Wyline Mood

## 2017-04-04 NOTE — Telephone Encounter (Signed)
Jason Yu called in regards to directions of his amLODipine (NORVASC) 10 MG  Appointment has been made with Dr. Wyline Mood for November.

## 2017-04-28 ENCOUNTER — Ambulatory Visit (INDEPENDENT_AMBULATORY_CARE_PROVIDER_SITE_OTHER): Payer: Medicaid Other | Admitting: *Deleted

## 2017-04-28 ENCOUNTER — Telehealth: Payer: Self-pay | Admitting: Cardiology

## 2017-04-28 DIAGNOSIS — I442 Atrioventricular block, complete: Secondary | ICD-10-CM | POA: Diagnosis not present

## 2017-04-28 NOTE — Telephone Encounter (Signed)
Spoke with pt and reminded pt of remote transmission that is due today. Pt verbalized understanding.   

## 2017-04-29 LAB — CUP PACEART REMOTE DEVICE CHECK
Battery Impedance: 430 Ohm
Battery Remaining Longevity: 81 mo
Brady Statistic AP VP Percent: 8 %
Brady Statistic AP VS Percent: 0 %
Brady Statistic AS VS Percent: 0 %
Date Time Interrogation Session: 20181029225301
Implantable Lead Implant Date: 20040709
Implantable Lead Location: 753859
Implantable Lead Model: 5076
Implantable Lead Model: 5076
Lead Channel Impedance Value: 531 Ohm
Lead Channel Pacing Threshold Amplitude: 0.75 V
Lead Channel Pacing Threshold Amplitude: 1.375 V
Lead Channel Pacing Threshold Pulse Width: 0.4 ms
Lead Channel Sensing Intrinsic Amplitude: 2.8 mV
MDC IDC LEAD IMPLANT DT: 20040709
MDC IDC LEAD LOCATION: 753860
MDC IDC MSMT BATTERY VOLTAGE: 2.79 V
MDC IDC MSMT LEADCHNL RA IMPEDANCE VALUE: 421 Ohm
MDC IDC MSMT LEADCHNL RV PACING THRESHOLD PULSEWIDTH: 0.4 ms
MDC IDC PG IMPLANT DT: 20130215
MDC IDC SET LEADCHNL RA PACING AMPLITUDE: 2 V
MDC IDC SET LEADCHNL RV PACING AMPLITUDE: 2.75 V
MDC IDC SET LEADCHNL RV PACING PULSEWIDTH: 0.4 ms
MDC IDC SET LEADCHNL RV SENSING SENSITIVITY: 5.6 mV
MDC IDC STAT BRADY AS VP PERCENT: 92 %

## 2017-04-29 NOTE — Progress Notes (Signed)
Remote pacemaker transmission.   

## 2017-05-06 ENCOUNTER — Encounter: Payer: Self-pay | Admitting: Cardiology

## 2017-05-08 ENCOUNTER — Institutional Professional Consult (permissible substitution): Payer: Medicaid Other | Admitting: Pulmonary Disease

## 2017-05-08 ENCOUNTER — Other Ambulatory Visit: Payer: Self-pay | Admitting: Neurosurgery

## 2017-05-08 DIAGNOSIS — M48062 Spinal stenosis, lumbar region with neurogenic claudication: Secondary | ICD-10-CM

## 2017-05-08 NOTE — Progress Notes (Deleted)
Subjective:    Patient ID: Jason Yu, male    DOB: 06/22/1951, 66 y.o.   MRN: [id number]  Synopsis: referred in 2018 for***  HPI No chief complaint on file.  ***  Past Medical History:  Diagnosis Date  . Anxiety   . Asthma    hasn't used inhaler at least 4-5 months.  . Complete heart block [facility](HCC)    with prior syncope s/p PPM 2004 in [place] (MDT Kappa);  08/16/2011 PPM Gen. Change: MDT Adapta L ADDDR1, [id number] H.  . Complication of anesthesia 05/2011   had difficulty breathing after surgery=C8 nerve root compression  . DDD (degenerative disc disease)    with multiple prior back surgeries   . GERD (gastroesophageal reflux disease)   . Hepatitis    Hx Hepatitis A & B  . HLD (hyperlipidemia)   . HTN (hypertension), benign   . Pacemaker   . Pneumonia last 1998   has had 8x  . PONV (postoperative nausea and vomiting)   . Shortness of breath   . Syncope    a.  2001 - normal cath;  b. 2012 Normal Myoview, EF 58%.     Family History  Problem Relation Age of Onset  . Diabetes Neg Hx   . Hypertension Neg Hx   . Coronary artery disease Neg Hx      Social History   Socioeconomic History  . Marital status: Single    Spouse name: Not on file  . Number of children: Not on file  . Years of education: Not on file  . Highest education level: Not on file  Social Needs  . Financial resource strain: Not on file  . Food insecurity - worry: Not on file  . Food insecurity - inability: Not on file  . Transportation needs - medical: Not on file  . Transportation needs - non-medical: Not on file  Occupational History  . Not on file  Tobacco Use  . Smoking status: Former Smoker    Packs/day: 2.00    Years: 3.00    Pack years: 6.00    Types: Cigarettes    Start date: 07/11/1968    Last attempt to quit: 11/29/1977    Years since quitting: 39.4  . Smokeless tobacco: Never Used  Substance and Sexual Activity  . Alcohol use: No    Alcohol/week: 0.0 oz  . Drug use: Yes   Types: Marijuana    Comment: smokes occasional marijuana, no ready to quit  . Sexual activity: Not on file  Other Topics Concern  . Not on file  Social History Narrative   Lives alone     Allergies  Allergen Reactions  . Other Other (See Comments)    Nuclear Stress Medicine- made him feel" out of touch" w/ reality.  . Adhesive [Tape] Other (See Comments)    Pulls skin off, Please use "paper" tape  . Prednisone Other (See Comments)    REACTION: pain in joints High Doses  . Codeine Rash    REACTION: rash,itch     Outpatient Medications Prior to Visit  Medication Sig Dispense Refill  . albuterol (PROVENTIL HFA;VENTOLIN HFA) 108 (90 BASE) MCG/ACT inhaler Inhale 2 puffs into the lungs every 6 (six) hours as needed. For shortness of breath    . amLODipine (NORVASC) 10 MG tablet Take 0.5 tablets (5 mg total) by mouth daily. 45 tablet 1  . Ascorbic Acid (VITAMIN C) 1000 MG tablet Take 1,000 mg by mouth daily.      .Marland Kitchen  atorvastatin (LIPITOR) 40 MG tablet TAKE 1 TABLET BY MOUTH DAILY 30 tablet 6  . Cholecalciferol (VITAMIN D) 2000 UNITS CAPS Take 2,000 Units by mouth daily.     Marland Kitchen. co-enzyme Q-10 30 MG capsule Take 30 mg by mouth daily.    Marland Kitchen. desonide (DESOWEN) 0.05 % cream Apply 1 application topically 2 (two) times daily as needed. For flare ups    . diazepam (VALIUM) 10 MG tablet Take 10 mg by mouth every 8 (eight) hours as needed. For anxiety    . Flaxseed, Linseed, (FLAX SEED OIL) 1000 MG CAPS Take 4,000 mg by mouth daily.    . Glucosamine-Chondroit-Vit C-Mn (GLUCOSAMINE 1500 COMPLEX) CAPS Take by mouth.    . hydrochlorothiazide (HYDRODIURIL) 25 MG tablet TAKE 1 TABLET BY MOUTH DAILY 30 tablet 6  . HYDROcodone-acetaminophen (NORCO) 10-325 MG per tablet Take 1 tablet by mouth every 8 (eight) hours as needed for moderate pain. For pain    . HYDROmorphone (DILAUDID) 4 MG tablet Take 4 mg by mouth 2 (two) times daily.     Marland Kitchen. ketoconazole (NIZORAL) 2 % cream Apply 1 application topically daily  as needed for irritation.    . Menthol, Topical Analgesic, (BLUE-EMU MAXIMUM STRENGTH) 2.5 % LIQD Apply 1 application topically 3 (three) times daily as needed (for muscle pain).    . Multiple Vitamin (MULTIVITAMIN) tablet Take 1 tablet by mouth daily.      Marland Kitchen. omeprazole (PRILOSEC) 20 MG capsule Take 20 mg by mouth 2 (two) times daily as needed (for heartburn or acid reflux).     Marland Kitchen. PARoxetine (PAXIL) 20 MG tablet Take 20 mg by mouth daily.     . vitamin B-12 (CYANOCOBALAMIN) 1000 MCG tablet Take 1,000 mcg by mouth daily.    . vitamin E 1000 UNIT capsule Take 1,000 Units by mouth daily.     No facility-administered medications prior to visit.       Review of Systems     Objective:   Physical Exam  There were no vitals filed for this visit.  ***  CBC    Component Value Date/Time   WBC 6.4 02/21/2012 1227   RBC 4.82 02/21/2012 1227   HGB 14.8 02/21/2012 1227   HCT 42.9 02/21/2012 1227   PLT 231 02/21/2012 1227   MCV 89.0 02/21/2012 1227   MCH 30.7 02/21/2012 1227   MCHC 34.5 02/21/2012 1227   RDW 13.3 02/21/2012 1227   LYMPHSABS 1.2 07/19/2009 2100   MONOABS 0.6 07/19/2009 2100   EOSABS 0.0 07/19/2009 2100   BASOSABS 0.0 07/19/2009 2100   BMET    Component Value Date/Time   NA 137 02/21/2012 1227   K 4.6 02/21/2012 1227   CL 102 02/21/2012 1227   CO2 25 02/21/2012 1227   GLUCOSE 110 (H) 02/21/2012 1227   BUN 13 02/21/2012 1227   CREATININE 0.75 02/21/2012 1227   CALCIUM 9.7 02/21/2012 1227   GFRNONAA >90 02/21/2012 1227   GFRAA >90 02/21/2012 1227         Assessment & Plan:    No diagnosis found.  Discussion: ***    Current Outpatient Medications:  .  albuterol (PROVENTIL HFA;VENTOLIN HFA) 108 (90 BASE) MCG/ACT inhaler, Inhale 2 puffs into the lungs every 6 (six) hours as needed. For shortness of breath, Disp: , Rfl:  .  amLODipine (NORVASC) 10 MG tablet, Take 0.5 tablets (5 mg total) by mouth daily., Disp: 45 tablet, Rfl: 1 .  Ascorbic Acid (VITAMIN  C) 1000 MG tablet, Take 1,000  mg by mouth daily.  , Disp: , Rfl:  .  atorvastatin (LIPITOR) 40 MG tablet, TAKE 1 TABLET BY MOUTH DAILY, Disp: 30 tablet, Rfl: 6 .  Cholecalciferol (VITAMIN D) 2000 UNITS CAPS, Take 2,000 Units by mouth daily. , Disp: , Rfl:  .  co-enzyme Q-10 30 MG capsule, Take 30 mg by mouth daily., Disp: , Rfl:  .  desonide (DESOWEN) 0.05 % cream, Apply 1 application topically 2 (two) times daily as needed. For flare ups, Disp: , Rfl:  .  diazepam (VALIUM) 10 MG tablet, Take 10 mg by mouth every 8 (eight) hours as needed. For anxiety, Disp: , Rfl:  .  Flaxseed, Linseed, (FLAX SEED OIL) 1000 MG CAPS, Take 4,000 mg by mouth daily., Disp: , Rfl:  .  Glucosamine-Chondroit-Vit C-Mn (GLUCOSAMINE 1500 COMPLEX) CAPS, Take by mouth., Disp: , Rfl:  .  hydrochlorothiazide (HYDRODIURIL) 25 MG tablet, TAKE 1 TABLET BY MOUTH DAILY, Disp: 30 tablet, Rfl: 6 .  HYDROcodone-acetaminophen (NORCO) 10-325 MG per tablet, Take 1 tablet by mouth every 8 (eight) hours as needed for moderate pain. For pain, Disp: , Rfl:  .  HYDROmorphone (DILAUDID) 4 MG tablet, Take 4 mg by mouth 2 (two) times daily. , Disp: , Rfl:  .  ketoconazole (NIZORAL) 2 % cream, Apply 1 application topically daily as needed for irritation., Disp: , Rfl:  .  Menthol, Topical Analgesic, (BLUE-EMU MAXIMUM STRENGTH) 2.5 % LIQD, Apply 1 application topically 3 (three) times daily as needed (for muscle pain)., Disp: , Rfl:  .  Multiple Vitamin (MULTIVITAMIN) tablet, Take 1 tablet by mouth daily.  , Disp: , Rfl:  .  omeprazole (PRILOSEC) 20 MG capsule, Take 20 mg by mouth 2 (two) times daily as needed (for heartburn or acid reflux). , Disp: , Rfl:  .  PARoxetine (PAXIL) 20 MG tablet, Take 20 mg by mouth daily. , Disp: , Rfl:  .  vitamin B-12 (CYANOCOBALAMIN) 1000 MCG tablet, Take 1,000 mcg by mouth daily., Disp: , Rfl:  .  vitamin E 1000 UNIT capsule, Take 1,000 Units by mouth daily., Disp: , Rfl:

## 2017-05-13 ENCOUNTER — Encounter: Payer: Self-pay | Admitting: *Deleted

## 2017-05-13 ENCOUNTER — Encounter: Payer: Self-pay | Admitting: Cardiology

## 2017-05-13 ENCOUNTER — Ambulatory Visit: Payer: Medicaid Other | Admitting: Cardiology

## 2017-05-13 VITALS — BP 132/80 | HR 87 | Ht 65.0 in | Wt 168.4 lb

## 2017-05-13 DIAGNOSIS — I10 Essential (primary) hypertension: Secondary | ICD-10-CM | POA: Diagnosis not present

## 2017-05-13 DIAGNOSIS — E782 Mixed hyperlipidemia: Secondary | ICD-10-CM

## 2017-05-13 DIAGNOSIS — I442 Atrioventricular block, complete: Secondary | ICD-10-CM

## 2017-05-13 NOTE — Patient Instructions (Signed)

## 2017-05-13 NOTE — Progress Notes (Signed)
Clinical Summary Jason Yu is a 66 y.o.male seen today for follow up of the following medical problems.   1. Complete heart block  - s/p pacemaker placement 2004  - normal function by device check during 12/2016 EP appt  - denies any significant lightheadedness or dizziness, no palps, no syncope    2. HTN   - home bp's 110s/70s - compliant with meds   3. HL  - he is compliant with statin. Recent labs with pcp.       Past Medical History:  Diagnosis Date  . Anxiety   . Asthma    hasn't used inhaler at least 4-5 months.  . Complete heart block [facility](HCC)    with prior syncope s/p PPM 2004 in [place] (MDT Kappa);  08/16/2011 PPM Gen. Change: MDT Adapta L ADDDR1, [id number] H.  . Complication of anesthesia 05/2011   had difficulty breathing after surgery=C8 nerve root compression  . DDD (degenerative disc disease)    with multiple prior back surgeries   . GERD (gastroesophageal reflux disease)   . Hepatitis    Hx Hepatitis A & B  . HLD (hyperlipidemia)   . HTN (hypertension), benign   . Pacemaker   . Pneumonia last 1998   has had 8x  . PONV (postoperative nausea and vomiting)   . Shortness of breath   . Syncope    a.  2001 - normal cath;  b. 2012 Normal Myoview, EF 58%.     Allergies  Allergen Reactions  . Other Other (See Comments)    Nuclear Stress Medicine- made him feel" out of touch" w/ reality.  . Adhesive [Tape] Other (See Comments)    Pulls skin off, Please use "paper" tape  . Prednisone Other (See Comments)    REACTION: pain in joints High Doses  . Codeine Rash    REACTION: rash,itch     Current Outpatient Medications  Medication Sig Dispense Refill  . albuterol (PROVENTIL HFA;VENTOLIN HFA) 108 (90 BASE) MCG/ACT inhaler Inhale 2 puffs into the lungs every 6 (six) hours as needed. For shortness of breath    . amLODipine (NORVASC) 10 MG tablet Take 0.5 tablets (5 mg total) by mouth daily. 45 tablet 1  . Ascorbic Acid (VITAMIN C) 1000 MG  tablet Take 1,000 mg by mouth daily.      Marland Kitchen. atorvastatin (LIPITOR) 40 MG tablet TAKE 1 TABLET BY MOUTH DAILY 30 tablet 6  . Cholecalciferol (VITAMIN D) 2000 UNITS CAPS Take 2,000 Units by mouth daily.     Marland Kitchen. co-enzyme Q-10 30 MG capsule Take 30 mg by mouth daily.    Marland Kitchen. desonide (DESOWEN) 0.05 % cream Apply 1 application topically 2 (two) times daily as needed. For flare ups    . diazepam (VALIUM) 10 MG tablet Take 10 mg by mouth every 8 (eight) hours as needed. For anxiety    . Flaxseed, Linseed, (FLAX SEED OIL) 1000 MG CAPS Take 4,000 mg by mouth daily.    . Glucosamine-Chondroit-Vit C-Mn (GLUCOSAMINE 1500 COMPLEX) CAPS Take by mouth.    . hydrochlorothiazide (HYDRODIURIL) 25 MG tablet TAKE 1 TABLET BY MOUTH DAILY 30 tablet 6  . HYDROcodone-acetaminophen (NORCO) 10-325 MG per tablet Take 1 tablet by mouth every 8 (eight) hours as needed for moderate pain. For pain    . HYDROmorphone (DILAUDID) 4 MG tablet Take 4 mg by mouth 2 (two) times daily.     Marland Kitchen. ketoconazole (NIZORAL) 2 % cream Apply 1 application topically daily as needed for irritation.    .Marland Kitchen  Menthol, Topical Analgesic, (BLUE-EMU MAXIMUM STRENGTH) 2.5 % LIQD Apply 1 application topically 3 (three) times daily as needed (for muscle pain).    . Multiple Vitamin (MULTIVITAMIN) tablet Take 1 tablet by mouth daily.      Marland Kitchen. omeprazole (PRILOSEC) 20 MG capsule Take 20 mg by mouth 2 (two) times daily as needed (for heartburn or acid reflux).     Marland Kitchen. PARoxetine (PAXIL) 20 MG tablet Take 20 mg by mouth daily.     . vitamin B-12 (CYANOCOBALAMIN) 1000 MCG tablet Take 1,000 mcg by mouth daily.    . vitamin E 1000 UNIT capsule Take 1,000 Units by mouth daily.     No current facility-administered medications for this visit.      Past Surgical History:  Procedure Laterality Date  . BACK SURGERY  08/2009, 09/2011  . CARDIAC CATHETERIZATION  2001   no significant CAD  . CARPAL TUNNEL RELEASE  1979  . cervical neck surgeries    . INSERT / REPLACE / REMOVE  PACEMAKER  08/2011   initially inserted 12/2002  . pacemaker generator change  08/16/11   Medtronic Adapta L implanted by Dr Johney FrameAllred, lead implangted 2004 in [place]  . PACEMAKER INSERTION     Implanted 2004 in [place]  . SHOULDER ARTHROSCOPY DISTAL CLAVICLE EXCISION AND OPEN ROTATOR CUFF REPAIR  2009   right  . SHOULDER ARTHROSCOPY DISTAL CLAVICLE EXCISION AND OPEN ROTATOR CUFF REPAIR  2010   Left  . ULNAR TUNNEL RELEASE  1980's(late) or early 1990's     Allergies  Allergen Reactions  . Other Other (See Comments)    Nuclear Stress Medicine- made him feel" out of touch" w/ reality.  . Adhesive [Tape] Other (See Comments)    Pulls skin off, Please use "paper" tape  . Prednisone Other (See Comments)    REACTION: pain in joints High Doses  . Codeine Rash    REACTION: rash,itch      Family History  Problem Relation Age of Onset  . Diabetes Neg Hx   . Hypertension Neg Hx   . Coronary artery disease Neg Hx      Social History Jason Yu reports that he quit smoking about 39 years ago. His smoking use included cigarettes. He started smoking about 48 years ago. He has a 6.00 pack-year smoking history. he has never used smokeless tobacco. Jason Yu reports that he does not drink alcohol.   Review of Systems CONSTITUTIONAL: No weight loss, fever, chills, weakness or fatigue.  HEENT: Eyes: No visual loss, blurred vision, double vision or yellow sclerae.No hearing loss, sneezing, congestion, runny nose or sore throat.  SKIN: No rash or itching.  CARDIOVASCULAR: per hpi RESPIRATORY: No shortness of breath, cough or sputum.  GASTROINTESTINAL: No anorexia, nausea, vomiting or diarrhea. No abdominal pain or blood.  GENITOURINARY: No burning on urination, no polyuria NEUROLOGICAL: No headache, dizziness, syncope, paralysis, ataxia, numbness or tingling in the extremities. No change in bowel or bladder control.  MUSCULOSKELETAL: No muscle, back pain, joint pain or stiffness.    LYMPHATICS: No enlarged nodes. No history of splenectomy.  PSYCHIATRIC: No history of depression or anxiety.  ENDOCRINOLOGIC: No reports of sweating, cold or heat intolerance. No polyuria or polydipsia.  Marland Kitchen.   Physical Examination Vitals:   05/13/17 1503  BP: 132/80  Pulse: 87  SpO2: 93%   Vitals:   05/13/17 1503  Weight: 168 lb 6.4 oz (76.4 kg)  Height: 5\' 5"  (1.651 m)    Gen: resting comfortably, no acute distress  HEENT: no scleral icterus, pupils equal round and reactive, no palptable cervical adenopathy,  CV: RRR, no m/r/g, no jvd Resp: Clear to auscultation bilaterally GI: abdomen is soft, non-tender, non-distended, normal bowel sounds, no hepatosplenomegaly MSK: extremities are warm, no edema.  Skin: warm, no rash Neuro:  no focal deficits Psych: appropriate affect   Diagnostic Studies     Assessment and Plan  1. Complete heart block  - asymptomatic, continue to follow in device clinic - ekg today A-sensed V-paced  2. HTN  - bp is at goal, continue current meds  3. Hyperlipidemia - continue statin, request labs from pcp   F/u 1 year      Antoine Poche, M.D.

## 2017-05-17 ENCOUNTER — Encounter: Payer: Self-pay | Admitting: Cardiology

## 2017-05-19 ENCOUNTER — Ambulatory Visit
Admission: RE | Admit: 2017-05-19 | Discharge: 2017-05-19 | Disposition: A | Discharge status: Home / Self Care | Payer: Medicaid Other | Source: Ambulatory Visit | Attending: Neurosurgery | Admitting: Neurosurgery

## 2017-05-19 ENCOUNTER — Other Ambulatory Visit: Payer: Self-pay | Admitting: Neurosurgery

## 2017-05-19 DIAGNOSIS — M48062 Spinal stenosis, lumbar region with neurogenic claudication: Secondary | ICD-10-CM

## 2017-05-19 MED ORDER — IOPAMIDOL (ISOVUE-M 300) INJECTION 61%: 10.0000 mL | Freq: Once | INTRAMUSCULAR | Status: DC | PRN

## 2017-05-19 MED ORDER — DIAZEPAM 5 MG PO TABS: 10.0000 mg | ORAL_TABLET | Freq: Once | ORAL | Status: DC

## 2017-05-19 NOTE — Progress Notes (Signed)
Patient states he has been off Paxil for at least the past two days.

## 2017-05-19 NOTE — Discharge Instructions (Signed)
Myelogram Discharge Instructions  1. Go home and rest quietly for the next 24 hours.  It is important to lie flat for the next 24 hours.  Get up only to go to the restroom.  You may lie in the bed or on a couch on your back, your stomach, your left side or your right side.  You may have one pillow under your head.  You may have pillows between your knees while you are on your side or under your knees while you are on your back.  2. DO NOT drive today.  Recline the seat as far back as it will go, while still wearing your seat belt, on the way home.  3. You may get up to go to the bathroom as needed.  You may sit up for 10 minutes to eat.  You may resume your normal diet and medications unless otherwise indicated.  Drink lots of extra fluids today and tomorrow.  4. The incidence of headache, nausea, or vomiting is about 5% (one in 20 patients).  If you develop a headache, lie flat and drink plenty of fluids until the headache goes away.  Caffeinated beverages may be helpful.  If you develop severe nausea and vomiting or a headache that does not go away with flat bed rest, call [phone].  5. You may resume normal activities after your 24 hours of bed rest is over; however, do not exert yourself strongly or do any heavy lifting tomorrow. If when you get up you have a headache when standing, go back to bed and force fluids for another 24 hours.  6. Call your physician for a follow-up appointment.  The results of your myelogram will be sent directly to your physician by the following day.  7. If you have any questions or if complications develop after you arrive home, please call [phone].  Discharge instructions have been explained to the patient.  The patient, or the person responsible for the patient, fully understands these instructions.       May resume Paxil on Nov. 20, 2018, after 1:00 pm.

## 2017-05-21 ENCOUNTER — Other Ambulatory Visit: Payer: Self-pay | Admitting: Neurosurgery

## 2017-05-21 DIAGNOSIS — M48062 Spinal stenosis, lumbar region with neurogenic claudication: Secondary | ICD-10-CM

## 2017-06-03 ENCOUNTER — Other Ambulatory Visit: Payer: Medicaid Other

## 2017-06-03 ENCOUNTER — Inpatient Hospital Stay: Admission: RE | Admit: 2017-06-03 | Payer: Medicaid Other | Source: Ambulatory Visit

## 2017-06-03 ENCOUNTER — Inpatient Hospital Stay
Admission: RE | Admit: 2017-06-03 | Discharge: 2017-06-03 | Disposition: A | Discharge status: Home / Self Care | Payer: Medicaid Other | Source: Ambulatory Visit | Attending: Neurosurgery | Admitting: Neurosurgery

## 2017-06-03 NOTE — Discharge Instructions (Signed)
Myelogram Discharge Instructions  1. Go home and rest quietly for the next 24 hours.  It is important to lie flat for the next 24 hours.  Get up only to go to the restroom.  You may lie in the bed or on a couch on your back, your stomach, your left side or your right side.  You may have one pillow under your head.  You may have pillows between your knees while you are on your side or under your knees while you are on your back.  2. DO NOT drive today.  Recline the seat as far back as it will go, while still wearing your seat belt, on the way home.  3. You may get up to go to the bathroom as needed.  You may sit up for 10 minutes to eat.  You may resume your normal diet and medications unless otherwise indicated.  Drink lots of extra fluids today and tomorrow.  4. The incidence of headache, nausea, or vomiting is about 5% (one in 20 patients).  If you develop a headache, lie flat and drink plenty of fluids until the headache goes away.  Caffeinated beverages may be helpful.  If you develop severe nausea and vomiting or a headache that does not go away with flat bed rest, call [phone].  5. You may resume normal activities after your 24 hours of bed rest is over; however, do not exert yourself strongly or do any heavy lifting tomorrow. If when you get up you have a headache when standing, go back to bed and force fluids for another 24 hours.  6. Call your physician for a follow-up appointment.  The results of your myelogram will be sent directly to your physician by the following day.  7. If you have any questions or if complications develop after you arrive home, please call [phone].  Discharge instructions have been explained to the patient.  The patient, or the person responsible for the patient, fully understands these instructions.    May resume Paxil on Dec. 5, 2018, after 1:00 pm.

## 2017-06-04 ENCOUNTER — Institutional Professional Consult (permissible substitution): Payer: Medicaid Other | Admitting: Pulmonary Disease

## 2017-06-05 ENCOUNTER — Encounter: Payer: Self-pay | Admitting: Pulmonary Disease

## 2017-06-05 ENCOUNTER — Ambulatory Visit: Payer: Medicaid Other | Admitting: Pulmonary Disease

## 2017-06-05 VITALS — BP 120/68 | HR 81 | Ht 65.0 in | Wt 165.0 lb

## 2017-06-05 DIAGNOSIS — R918 Other nonspecific abnormal finding of lung field: Secondary | ICD-10-CM | POA: Diagnosis not present

## 2017-06-05 DIAGNOSIS — R0609 Other forms of dyspnea: Secondary | ICD-10-CM | POA: Diagnosis not present

## 2017-06-05 DIAGNOSIS — J449 Chronic obstructive pulmonary disease, unspecified: Secondary | ICD-10-CM

## 2017-06-05 MED ORDER — FLUTICASONE FUROATE-VILANTEROL 200-25 MCG/INH IN AEPB: 1.0000 | INHALATION_SPRAY | Freq: Every day | RESPIRATORY_TRACT | 0 refills | Status: DC

## 2017-06-05 NOTE — Progress Notes (Signed)
Subjective:   PATIENT ID: Jason Yu GENDER: male DOB: 1951/04/10, MRN: [id number]   Synopsis: Referred in Dec 2018 for lung nodules  HPI  Chief Complaint  Patient presents with  . pulmonary consult    [facility] [place] for lung nodules    He fell and broke his ribs and ended up coughing up some blood so he had a CT angiogram which showed some pulmonary nodules.  These were followed and didn't grow on a second CT.  He has a cough: > worse in the mornings > he will produce clear mucus in the mornings, its frothy > he does cough a lot during the daytime > he has a sensation of an irritation in his throat   Denies heartburn.  He takes omeprazole which helps a lot. No sinus congestion, no post nasal drip.    He has a lot of shortness of breath and feels he can't take a deep breath > he feels it with exertion > he can climb stairs without dyspnea, if he goes slow > he can't lift his groceries   He uses "95% Oxygen" hits about throughout the day which makes him breathe better.  He buys these off the internet.  He uses this for about 5-10 times per day.  This helps his leg pain.    He had childhood asthma: > severe, went to the Doc three times a week for shots  He smoked up until 1979, only 7 years.  He smoke marijuana > he has been vaping it for the last two months with a smokeless symptoms > he says that he doesn't produce much smoke > he doesn't cough when he does this > prior to that he smoked a bong, throughout the day > he would use this to control chronic pain   He remains active but he is plagued with severe orthopedic pain all over.  He had three back surgeries and has numbness in his feet bilaterally.    Past Medical History:  Diagnosis Date  . Anxiety   . Asthma    hasn't used inhaler at least 4-5 months.  . Complete heart block [facility](HCC)    with prior syncope s/p PPM 2004 in [place] (MDT Kappa);  08/16/2011 PPM Gen. Change: MDT  Adapta L ADDDR1, [id number] H.  . Complication of anesthesia 05/2011   had difficulty breathing after surgery=C8 nerve root compression  . DDD (degenerative disc disease)    with multiple prior back surgeries   . GERD (gastroesophageal reflux disease)   . Hepatitis    Hx Hepatitis A & B  . HLD (hyperlipidemia)   . HTN (hypertension), benign   . Pacemaker   . Pneumonia last 1998   has had 8x  . PONV (postoperative nausea and vomiting)   . Shortness of breath   . Syncope    a.  2001 - normal cath;  b. 2012 Normal Myoview, EF 58%.     Family History  Problem Relation Age of Onset  . Diabetes Neg Hx   . Hypertension Neg Hx   . Coronary artery disease Neg Hx      Social History   Socioeconomic History  . Marital status: Single    Spouse name: Not on file  . Number of children: Not on file  . Years of education: Not on file  . Highest education level: Not on file  Social Needs  . Financial resource strain: Not on file  . Food insecurity -  worry: Not on file  . Food insecurity - inability: Not on file  . Transportation needs - medical: Not on file  . Transportation needs - non-medical: Not on file  Occupational History  . Not on file  Tobacco Use  . Smoking status: Former Smoker    Packs/day: 2.00    Years: 3.00    Pack years: 6.00    Types: Cigarettes    Start date: 07/11/1968    Last attempt to quit: 11/29/1977    Years since quitting: 39.5  . Smokeless tobacco: Never Used  Substance and Sexual Activity  . Alcohol use: No    Alcohol/week: 0.0 oz  . Drug use: Yes    Types: Marijuana    Comment: smokes occasional marijuana, no ready to quit  . Sexual activity: Not on file  Other Topics Concern  . Not on file  Social History Narrative   Lives alone     Allergies  Allergen Reactions  . Other Other (See Comments)    Nuclear Stress Medicine- made him feel" out of touch" w/ reality.  . Adhesive [Tape] Other (See Comments)    Pulls skin off, Please use "paper" tape   . Codeine Itching and Rash  . Prednisone Other (See Comments)      pain in joints, high doses     Outpatient Medications Prior to Visit  Medication Sig Dispense Refill  . albuterol (PROVENTIL HFA;VENTOLIN HFA) 108 (90 BASE) MCG/ACT inhaler Inhale 2 puffs into the lungs every 6 (six) hours as needed. For shortness of breath    . amLODipine (NORVASC) 10 MG tablet Take 0.5 tablets (5 mg total) by mouth daily. 45 tablet 1  . Ascorbic Acid (VITAMIN C) 1000 MG tablet Take 1,000 mg by mouth daily.      Marland Kitchen atorvastatin (LIPITOR) 40 MG tablet TAKE 1 TABLET BY MOUTH DAILY 30 tablet 6  . Cholecalciferol (VITAMIN D) 2000 UNITS CAPS Take 2,000 Units by mouth daily.     Marland Kitchen co-enzyme Q-10 30 MG capsule Take 30 mg by mouth daily.    . diazepam (VALIUM) 10 MG tablet Take 10 mg by mouth every 8 (eight) hours as needed. For anxiety    . Flaxseed, Linseed, (FLAX SEED OIL) 1000 MG CAPS Take 4,000 mg by mouth daily.    . Glucosamine-Chondroit-Vit C-Mn (GLUCOSAMINE 1500 COMPLEX) CAPS Take by mouth.    . hydrochlorothiazide (HYDRODIURIL) 25 MG tablet TAKE 1 TABLET BY MOUTH DAILY 30 tablet 6  . HYDROcodone-acetaminophen (NORCO) 10-325 MG per tablet Take 1 tablet by mouth every 8 (eight) hours as needed for moderate pain. For pain    . HYDROmorphone (DILAUDID) 4 MG tablet Take 4 mg by mouth 2 (two) times daily.     . Menthol, Topical Analgesic, (BLUE-EMU MAXIMUM STRENGTH) 2.5 % LIQD Apply 1 application topically 3 (three) times daily as needed (for muscle pain).    . Multiple Vitamin (MULTIVITAMIN) tablet Take 1 tablet by mouth daily.      Marland Kitchen omeprazole (PRILOSEC) 20 MG capsule Take 20 mg by mouth 2 (two) times daily as needed (for heartburn or acid reflux).     Marland Kitchen PARoxetine (PAXIL) 20 MG tablet Take 20 mg by mouth daily.     . vitamin B-12 (CYANOCOBALAMIN) 1000 MCG tablet Take 1,000 mcg by mouth daily.    . vitamin E 1000 UNIT capsule Take 1,000 Units by mouth daily.     No facility-administered medications prior to  visit.     Review of Systems  Constitutional: Negative for chills, fever, malaise/fatigue and weight loss.  HENT: Positive for congestion and sinus pain. Negative for nosebleeds and sore throat.   Eyes: Negative for photophobia, pain and discharge.  Respiratory: Positive for cough, shortness of breath and wheezing. Negative for hemoptysis and sputum production.   Cardiovascular: Negative for chest pain, palpitations, orthopnea and leg swelling.  Gastrointestinal: Negative for abdominal pain, constipation, diarrhea, nausea and vomiting.  Genitourinary: Negative for dysuria, frequency, hematuria and urgency.  Musculoskeletal: Negative for back pain, joint pain, myalgias and neck pain.  Skin: Negative for itching and rash.  Neurological: Positive for headaches. Negative for tingling, tremors, sensory change, speech change, focal weakness, seizures and weakness.  Psychiatric/Behavioral: Negative for memory loss, substance abuse and suicidal ideas. The patient is not nervous/anxious.       Objective:  Physical Exam   Vitals:   06/05/17 1008  BP: 120/68  Pulse: 81  SpO2: 93%  Weight: 165 lb (74.8 kg)  Height: 5\' 5"  (1.651 m)    Gen: well appearing, no acute distress HENT: NCAT, OP clear, neck supple without masses Eyes: PERRL, EOMi Lymph: no cervical lymphadenopathy PULM: CTA B, normal effort CV: RRR, no mgr, no JVD GI: BS+, soft, nontender, no hsm Derm: no rash or skin breakdown MSK: diminished bulk/tone of hands bilaterally Neuro: A&Ox4, CN II-XII intact, strength 4/5 in all 4 extremities Psyche: normal mood and affect   CBC    Component Value Date/Time   WBC 6.4 02/21/2012 1227   RBC 4.82 02/21/2012 1227   HGB 14.8 02/21/2012 1227   HCT 42.9 02/21/2012 1227   PLT 231 02/21/2012 1227   MCV 89.0 02/21/2012 1227   MCH 30.7 02/21/2012 1227   MCHC 34.5 02/21/2012 1227   RDW 13.3 02/21/2012 1227   LYMPHSABS 1.2 07/19/2009 2100   MONOABS 0.6 07/19/2009 2100   EOSABS  0.0 07/19/2009 2100   BASOSABS 0.0 07/19/2009 2100     Chest imaging: April 2018 CT chest images independently reviewed showing a 2.3 mm nodule in the right upper lobe and an 8 mm nodule in the left upper lobe, I question whether or not this is a pleural-based lymph node.  PFT: December 2018 spirometry test ratio 65%, FEV1 1.46 L 51% predicted, FVC 2.26 L 59% predicted  Labs:  Path:  Echo:  Heart Catheterization:  Recent cardiology records reviewed where he saw Dr. Wyline MoodBranch for HTN, hypertlipidemia and complete heart block.     Assessment & Plan:   Dyspnea on exertion - Plan: Spirometry with graph, Spirometry with graph  Abnormal findings on diagnostic imaging of lung - Plan: CT Chest Wo Contrast Mild intermittent asthma with COPD Pulmonary nodule Marijuana use   Discussion: Mr. Renette ButtersGolden presents today for evaluation of his pulmonary nodules.  The most recent CT scan of his chest in April showed no growth nodules over several months.  I have personally reviewed these images and they do appear benign but will need to be followed with a repeat CT in October 2019.  Today's spirometry test was consistent with COPD, though he has an extensive history of asthma over the years.  Regardless, he has fixed airflow obstruction so we will start Brio today to see if this helps.  I will have him come back in a couple weeks to go over these results.  His flu shot is up-to-date.  He was counseled today on the importance of good hand hygiene.  Plan: Pulmonary nodule: I am pleased that these have not increased in size  At this time they appear to be benign but we need to get another CT scan in October of next year to make sure they have not grown let us know if you cough up blood or have unexplained weight loss or chest pain  COPD with Asthma: Spirometry today actually showed COPD. Take Breo 1 puff daily no matter how you feel Ambulatory oximetry monitoring today Keep using albuterol as needed  for shortness of breath  We will see you back in 1-2 weeks with our nurse practitioner to see if the Breo helped     Current Outpatient Medications:  .  albuterol (PROVENTIL HFA;VENTOLIN HFA) 108 (90 BASE) MCG/ACT inhaler, Inhale 2 puffs into the lungs every 6 (six) hours as needed. For shortness of breath, Disp: , Rfl:  .  amLODipine (NORVASC) 10 MG tablet, Take 0.5 tablets (5 mg total) by mouth daily., Disp: 45 tablet, Rfl: 1 .  Ascorbic Acid (VITAMIN C) 1000 MG tablet, Take 1,000 mg by mouth daily.  , Disp: , Rfl:  .  atorvastatin (LIPITOR) 40 MG tablet, TAKE 1 TABLET BY MOUTH DAILY, Disp: 30 tablet, Rfl: 6 .  Cholecalciferol (VITAMIN D) 2000 UNITS CAPS, Take 2,000 Units by mouth daily. , Disp: , Rfl:  .  co-enzyme Q-10 30 MG capsule, Take 30 mg by mouth daily., Disp: , Rfl:  .  diazepam (VALIUM) 10 MG tablet, Take 10 mg by mouth every 8 (eight) hours as needed. For anxiety, Disp: , Rfl:  .  Flaxseed, Linseed, (FLAX SEED OIL) 1000 MG CAPS, Take 4,000 mg by mouth daily., Disp: , Rfl:  .  Glucosamine-Chondroit-Vit C-Mn (GLUCOSAMINE 1500 COMPLEX) CAPS, Take by mouth., Disp: , Rfl:  .  hydrochlorothiazide (HYDRODIURIL) 25 MG tablet, TAKE 1 TABLET BY MOUTH DAILY, Disp: 30 tablet, Rfl: 6 .  HYDROcodone-acetaminophen (NORCO) 10-325 MG per tablet, Take 1 tablet by mouth every 8 (eight) hours as needed for moderate pain. For pain, Disp: , Rfl:  .  HYDROmorphone (DILAUDID) 4 MG tablet, Take 4 mg by mouth 2 (two) times daily. , Disp: , Rfl:  .  Menthol, Topical Analgesic, (BLUE-EMU MAXIMUM STRENGTH) 2.5 % LIQD, Apply 1 application topically 3 (three) times daily as needed (for muscle pain)., Disp: , Rfl:  .  Multiple Vitamin (MULTIVITAMIN) tablet, Take 1 tablet by mouth daily.  , Disp: , Rfl:  .  omeprazole (PRILOSEC) 20 MG capsule, Take 20 mg by mouth 2 (two) times daily as needed (for heartburn or acid reflux). , Disp: , Rfl:  .  PARoxetine (PAXIL) 20 MG tablet, Take 20 mg by mouth daily. , Disp: ,  Rfl:  .  vitamin B-12 (CYANOCOBALAMIN) 1000 MCG tablet, Take 1,000 mcg by mouth daily., Disp: , Rfl:  .  vitamin E 1000 UNIT capsule, Take 1,000 Units by mouth daily., Disp: , Rfl:

## 2017-06-05 NOTE — Patient Instructions (Addendum)
Pulmonary nodule: I am pleased that these have not increased in size At this time they appear to be benign but we need to get another CT scan in October of next year to make sure they have not grown let us know if you cough up blood or have unexplained weight loss or chest pain  COPD with Asthma: Spirometry today actually showed COPD. Take Breo 1 puff daily no matter how you feel Ambulatory oximetry monitoring today Keep using albuterol as needed for shortness of breath  We will see you back in 1-2 weeks with our nurse practitioner [facility]

## 2017-06-13 ENCOUNTER — Ambulatory Visit
Admission: RE | Admit: 2017-06-13 | Discharge: 2017-06-13 | Disposition: A | Discharge status: Home / Self Care | Payer: Medicaid Other | Source: Ambulatory Visit | Attending: Neurosurgery | Admitting: Neurosurgery

## 2017-06-13 ENCOUNTER — Other Ambulatory Visit: Payer: Medicaid Other

## 2017-06-13 DIAGNOSIS — M48062 Spinal stenosis, lumbar region with neurogenic claudication: Secondary | ICD-10-CM

## 2017-06-13 MED ORDER — DIAZEPAM 5 MG PO TABS: 10.0000 mg | ORAL_TABLET | Freq: Once | ORAL | Status: DC

## 2017-06-13 MED ORDER — IOPAMIDOL (ISOVUE-M 300) INJECTION 61%
10.0000 mL | Freq: Once | INTRAMUSCULAR | Status: AC | PRN
  Administered 2017-06-13: 10 mL via INTRATHECAL

## 2017-06-13 NOTE — Discharge Instructions (Signed)

## 2017-06-17 ENCOUNTER — Ambulatory Visit: Payer: Medicaid Other | Admitting: Adult Health

## 2017-06-17 ENCOUNTER — Telehealth: Payer: Self-pay | Admitting: Pulmonary Disease

## 2017-06-17 ENCOUNTER — Encounter: Payer: Self-pay | Admitting: Adult Health

## 2017-06-17 DIAGNOSIS — J449 Chronic obstructive pulmonary disease, unspecified: Secondary | ICD-10-CM | POA: Insufficient documentation

## 2017-06-17 DIAGNOSIS — J441 Chronic obstructive pulmonary disease with (acute) exacerbation: Secondary | ICD-10-CM | POA: Diagnosis not present

## 2017-06-17 DIAGNOSIS — J4489 Other specified chronic obstructive pulmonary disease: Secondary | ICD-10-CM | POA: Insufficient documentation

## 2017-06-17 DIAGNOSIS — R918 Other nonspecific abnormal finding of lung field: Secondary | ICD-10-CM | POA: Insufficient documentation

## 2017-06-17 MED ORDER — FLUTICASONE FUROATE-VILANTEROL 200-25 MCG/INH IN AEPB: 1.0000 | INHALATION_SPRAY | Freq: Every day | RESPIRATORY_TRACT | 4 refills | Status: DC

## 2017-06-17 MED ORDER — FLUTICASONE FUROATE-VILANTEROL 200-25 MCG/INH IN AEPB: 1.0000 | INHALATION_SPRAY | Freq: Every day | RESPIRATORY_TRACT | 0 refills | Status: DC

## 2017-06-17 NOTE — Progress Notes (Signed)
 @Patient  ID: Jason Yu, male    DOB: 09/07/1950, 66 y.o.   MRN: [id number]  Chief Complaint  Patient presents with  . Follow-up    COPD     Referring provider: No ref. provider found  HPI: 66 yo male former smoker seen for pulmonary consult for lung nodules 06/05/17 found to have moderate COPD with asthma  +THC use   TEST  Chest imaging: April 2018 CT chest images independently reviewed showing a 2.3 mm nodule in the right upper lobe and an 8 mm nodule in the left upper lobe, I question whether or not this is a pleural-based lymph node.  PFT: December 2018 spirometry test ratio 65%, FEV1 1.46 L 51% predicted, FVC 2.26 L 59% predicted   06/17/2017 Follow up : COPD and Lung nodule  Patient returns for a 2-week follow-up.  Patient was seen last visit for a pulmonary consult.  Patient had been found to have pulmonary nodules on CT chest.  Notable for a 2.3 mm, in the right upper lobe and an 8 mm nodule in the left upper lobe.. He has been recommended to have a follow-up CT chest in November 2019.   Patient does have a smoking history and intermittent shortness of breath.  He has a long-standing history of asthma.  PFT showed chronic airflow obstruction with an FEV1 at 51%, ratio 65%.  FVC 59%. Patient was recommended to begin on BREO daily.  Since last visit patient says he is feeling better.  Cough and shortness of breath have decreased.  Patient denies any wheezing.  Denies any albuterol use.   Patient does have chronic pain with diffuse joint issues.  He does use marijuana and inhaled menthol on a regular basis he also has oxygen tank that he got from the Internet that he occasionally uses which he feels helps his pains..   Allergies  Allergen Reactions  . Other Other (See Comments)    Nuclear Stress Medicine- made him feel" out of touch" w/ reality.  . Adhesive [Tape] Other (See Comments)    Pulls skin off, Please use "paper" tape  . Codeine Itching and Rash  .  Prednisone Other (See Comments)      pain in joints, high doses    Immunization History  Administered Date(s) Administered  . Influenza Split 04/17/2017  . Influenza,inj,Quad PF,6+ Mos 05/17/2014    Past Medical History:  Diagnosis Date  . Anxiety   . Asthma    hasn't used inhaler at least 4-5 months.  . Complete heart block [facility](HCC)    with prior syncope s/p PPM 2004 in [place] (MDT [place]);  08/16/2011 PPM Gen. Change: MDT Adapta L ADDDR1, [id number] H.  . Complication of anesthesia 05/2011   had difficulty breathing after surgery=C8 nerve root compression  . DDD (degenerative disc disease)    with multiple prior back surgeries   . GERD (gastroesophageal reflux disease)   . Hepatitis    Hx Hepatitis A & B  . HLD (hyperlipidemia)   . HTN (hypertension), benign   . Pacemaker   . Pneumonia last 1998   has had 8x  . PONV (postoperative nausea and vomiting)   . Shortness of breath   . Syncope    a.  2001 - normal cath;  b. 2012 Normal Myoview, EF 58%.    Tobacco History: Social History   Tobacco Use  Smoking Status Former Smoker  . Packs/day: 2.00  . Years: 3.00  . Pack years: 6.00  . Types: Cigarettes  .  Start date: 07/11/1968  . Last attempt to quit: 11/29/1977  . Years since quitting: 39.5  Smokeless Tobacco Never Used   Counseling given: Not Answered   Outpatient Encounter Medications as of 06/17/2017  Medication Sig  . albuterol (PROVENTIL HFA;VENTOLIN HFA) 108 (90 BASE) MCG/ACT inhaler Inhale 2 puffs into the lungs every 6 (six) hours as needed. For shortness of breath  . amLODipine (NORVASC) 10 MG tablet Take 0.5 tablets (5 mg total) by mouth daily.  . Ascorbic Acid (VITAMIN C) 1000 MG tablet Take 1,000 mg by mouth daily.    Marland Kitchen atorvastatin (LIPITOR) 40 MG tablet TAKE 1 TABLET BY MOUTH DAILY  . Cholecalciferol (VITAMIN D) 2000 UNITS CAPS Take 2,000 Units by mouth daily.   Marland Kitchen co-enzyme Q-10 30 MG capsule Take 30 mg by mouth daily.  . diazepam (VALIUM) 10 MG tablet  Take 10 mg by mouth every 8 (eight) hours as needed. For anxiety  . Flaxseed, Linseed, (FLAX SEED OIL) 1000 MG CAPS Take 4,000 mg by mouth daily.  . fluticasone furoate-vilanterol (BREO ELLIPTA) 200-25 MCG/INH AEPB Inhale 1 puff into the lungs daily.  . Glucosamine-Chondroit-Vit C-Mn (GLUCOSAMINE 1500 COMPLEX) CAPS Take by mouth.  . hydrochlorothiazide (HYDRODIURIL) 25 MG tablet TAKE 1 TABLET BY MOUTH DAILY  . HYDROcodone-acetaminophen (NORCO) 10-325 MG per tablet Take 1 tablet by mouth every 8 (eight) hours as needed for moderate pain. For pain  . HYDROmorphone (DILAUDID) 4 MG tablet Take 4 mg by mouth 2 (two) times daily.   . Menthol, Topical Analgesic, (BLUE-EMU MAXIMUM STRENGTH) 2.5 % LIQD Apply 1 application topically 3 (three) times daily as needed (for muscle pain).  . Multiple Vitamin (MULTIVITAMIN) tablet Take 1 tablet by mouth daily.    Marland Kitchen omeprazole (PRILOSEC) 20 MG capsule Take 20 mg by mouth 2 (two) times daily as needed (for heartburn or acid reflux).   Marland Kitchen PARoxetine (PAXIL) 20 MG tablet Take 20 mg by mouth daily.   . vitamin B-12 (CYANOCOBALAMIN) 1000 MCG tablet Take 1,000 mcg by mouth daily.  . vitamin E 1000 UNIT capsule Take 1,000 Units by mouth daily.  . [DISCONTINUED] fluticasone furoate-vilanterol (BREO ELLIPTA) 200-25 MCG/INH AEPB Inhale 1 puff into the lungs daily.  . [DISCONTINUED] fluticasone furoate-vilanterol (BREO ELLIPTA) 200-25 MCG/INH AEPB Inhale 1 puff into the lungs daily.   No facility-administered encounter medications on file as of 06/17/2017.      Review of Systems  Constitutional:   No  weight loss, night sweats,  Fevers, chills, fatigue, or  lassitude.  HEENT:   No headaches,  Difficulty swallowing,  Tooth/dental problems, or  Sore throat,                No sneezing, itching, ear ache, nasal congestion, post nasal drip,   CV:  No chest pain,  Orthopnea, PND, swelling in lower extremities, anasarca, dizziness, palpitations, syncope.   GI  No heartburn,  indigestion, abdominal pain, nausea, vomiting, diarrhea, change in bowel habits, loss of appetite, bloody stools.   Resp:  No chest wall deformity  Skin: no rash or lesions.  GU: no dysuria, change in color of urine, no urgency or frequency.  No flank pain, no hematuria   MS:  No joint pain or swelling.  No decreased range of motion.  No back pain.    Physical Exam  BP 124/70 (BP Location: Left Arm, Cuff Size: Normal)   Pulse 66   Wt 167 lb (75.8 kg)   SpO2 97%   BMI 27.79 kg/m  GEN: A/Ox3; pleasant , NAD,    HEENT:  [place]/AT,  EACs-clear, TMs-wnl, NOSE-clear, THROAT-clear, no lesions, no postnasal drip or exudate noted.   NECK:  Supple w/ fair ROM; no JVD; normal carotid impulses w/o bruits; no thyromegaly or nodules palpated; no lymphadenopathy.    RESP  Clear  P & A; w/o, wheezes/ rales/ or rhonchi. no accessory muscle use, no dullness to percussion  CARD:  RRR, no m/r/g, no peripheral edema, pulses intact, no cyanosis or clubbing.  GI:   Soft & nt; nml bowel sounds; no organomegaly or masses detected.   Musco: Warm bil, no deformities or joint swelling noted.   Neuro: alert, no focal deficits noted.    Skin: Warm, no lesions or rashes    Lab Results:   BNP No results found for: BNP  ProBNP No results found for: PROBNP  Imaging:    Assessment & Plan:   COPD with asthma (HCC) COPD w/ Asthma  Improved sx control on BREO  Discussion with pt regarding potential vaping issues   Plan  Patient Instructions  Continue on BREO 1 puff daily  Plan for CT chest 101/2019.  Follow up with Dr. Kendrick FriesMcQuaid in 4  months and As needed      Lung nodules Plan for serial CT chest in 05/2018.       Jason Oaksammy Shanda Cadotte, NP 06/17/2017

## 2017-06-17 NOTE — Patient Instructions (Signed)
Continue on BREO 1 puff daily  Plan for CT chest 101/2019.  Follow up with Dr. Kendrick FriesMcQuaid in 4  months and As needed

## 2017-06-17 NOTE — Assessment & Plan Note (Signed)
COPD w/ Asthma  Improved sx control on BREO  Discussion with pt regarding potential vaping issues   Plan  Patient Instructions  Continue on BREO 1 puff daily  Plan for CT chest 101/2019.  Follow up with Dr. Kendrick FriesMcQuaid in 4  months and As needed

## 2017-06-17 NOTE — Telephone Encounter (Signed)
Spoke with pt. He states that one of his medications on his list is incorrect. Pt states that he uses Blue-Emu cream but it does not have menthol it in. On his medication list, the generic name coming up is Menthol, Topical Analgesic. I advised the pt that there is nothing we can do about that due to the system populating that name. He verbalized understanding. Nothing further was needed at this time.

## 2017-06-17 NOTE — Assessment & Plan Note (Signed)
Plan for serial CT chest in 05/2018.

## 2017-06-18 NOTE — Progress Notes (Signed)
Reviewed, agree 

## 2017-07-02 ENCOUNTER — Telehealth: Payer: Self-pay | Admitting: Pulmonary Disease

## 2017-07-02 MED ORDER — FLUTICASONE FUROATE-VILANTEROL 200-25 MCG/INH IN AEPB: 1.0000 | INHALATION_SPRAY | Freq: Every day | RESPIRATORY_TRACT | 6 refills | Status: DC

## 2017-07-02 NOTE — Telephone Encounter (Signed)
Spoke with patient regarding Brep script Placed RX of Bre[facility] to [facility] today Nothing further needed

## 2017-07-03 ENCOUNTER — Telehealth: Payer: Self-pay | Admitting: Pulmonary Disease

## 2017-07-03 NOTE — Telephone Encounter (Signed)
[facility]. Attempted to initiate PA via [email or web address]- Key: [id number]. Patient has Medicaid so I had to call [employer] to initiate.  [place], [place] Breo 200 has been approved from 07/03/17-06/28/18.  PA# [id number].  Pharmacy aware of approval.  Nothing further needed.

## 2017-07-28 ENCOUNTER — Ambulatory Visit (INDEPENDENT_AMBULATORY_CARE_PROVIDER_SITE_OTHER): Payer: Medicare Other | Admitting: *Deleted

## 2017-07-28 ENCOUNTER — Telehealth: Payer: Self-pay | Admitting: Cardiology

## 2017-07-28 DIAGNOSIS — I442 Atrioventricular block, complete: Secondary | ICD-10-CM | POA: Diagnosis not present

## 2017-07-28 NOTE — Telephone Encounter (Signed)
LMOVM reminding pt to send remote transmission.   

## 2017-07-29 NOTE — Progress Notes (Signed)
Remote pacemaker transmission.   

## 2017-07-30 ENCOUNTER — Encounter: Payer: Self-pay | Admitting: Cardiology

## 2017-07-30 LAB — CUP PACEART REMOTE DEVICE CHECK
Battery Remaining Longevity: 83 mo
Brady Statistic AP VS Percent: 0 %
Brady Statistic AS VP Percent: 93 %
Brady Statistic AS VS Percent: 0 %
Date Time Interrogation Session: 20190129001715
Implantable Lead Implant Date: 20040709
Implantable Lead Location: 753859
Implantable Lead Location: 753860
Lead Channel Impedance Value: 533 Ohm
Lead Channel Pacing Threshold Amplitude: 0.625 V
Lead Channel Pacing Threshold Amplitude: 1.25 V
Lead Channel Pacing Threshold Pulse Width: 0.4 ms
Lead Channel Pacing Threshold Pulse Width: 0.4 ms
Lead Channel Setting Pacing Amplitude: 2.5 V
Lead Channel Setting Pacing Pulse Width: 0.4 ms
Lead Channel Setting Sensing Sensitivity: 5.6 mV
MDC IDC LEAD IMPLANT DT: 20040709
MDC IDC MSMT BATTERY IMPEDANCE: 479 Ohm
MDC IDC MSMT BATTERY VOLTAGE: 2.79 V
MDC IDC MSMT LEADCHNL RA IMPEDANCE VALUE: 410 Ohm
MDC IDC MSMT LEADCHNL RA SENSING INTR AMPL: 2.8 mV
MDC IDC PG IMPLANT DT: 20130215
MDC IDC SET LEADCHNL RA PACING AMPLITUDE: 2 V
MDC IDC STAT BRADY AP VP PERCENT: 7 %

## 2017-08-14 ENCOUNTER — Telehealth: Payer: Self-pay | Admitting: Pulmonary Disease

## 2017-08-14 MED ORDER — ALBUTEROL SULFATE HFA 108 (90 BASE) MCG/ACT IN AERS: 2.0000 | INHALATION_SPRAY | Freq: Four times a day (QID) | RESPIRATORY_TRACT | 2 refills | Status: DC | PRN

## 2017-08-14 NOTE — Telephone Encounter (Signed)
[facility], who states that proair is not covered and preferred is ventolin.  Rx for Ventolin has been sent to preferred pharmacy per office protocol. Pt is aware and voiced his understanding. Nothing further is needed.

## 2017-08-21 ENCOUNTER — Other Ambulatory Visit (HOSPITAL_COMMUNITY): Payer: Self-pay | Admitting: Family Medicine

## 2017-08-21 DIAGNOSIS — Z79899 Other long term (current) drug therapy: Secondary | ICD-10-CM

## 2017-08-22 ENCOUNTER — Other Ambulatory Visit (HOSPITAL_COMMUNITY): Payer: Self-pay | Admitting: Family Medicine

## 2017-08-22 DIAGNOSIS — I4430 Unspecified atrioventricular block: Secondary | ICD-10-CM

## 2017-08-22 DIAGNOSIS — Z87891 Personal history of nicotine dependence: Secondary | ICD-10-CM

## 2017-08-25 ENCOUNTER — Encounter (HOSPITAL_COMMUNITY): Payer: Medicaid Other

## 2017-08-26 ENCOUNTER — Encounter (INDEPENDENT_AMBULATORY_CARE_PROVIDER_SITE_OTHER): Payer: Self-pay | Admitting: *Deleted

## 2017-08-28 ENCOUNTER — Encounter (HOSPITAL_COMMUNITY): Payer: Self-pay

## 2017-08-28 ENCOUNTER — Other Ambulatory Visit (HOSPITAL_COMMUNITY): Payer: Self-pay | Admitting: Family Medicine

## 2017-08-28 ENCOUNTER — Other Ambulatory Visit (HOSPITAL_COMMUNITY): Payer: Medicaid Other

## 2017-08-28 ENCOUNTER — Ambulatory Visit (HOSPITAL_COMMUNITY)
Admission: RE | Admit: 2017-08-28 | Discharge: 2017-08-28 | Disposition: A | Discharge status: Home / Self Care | Payer: Medicaid Other | Source: Ambulatory Visit | Attending: Family Medicine | Admitting: Family Medicine

## 2017-10-16 ENCOUNTER — Ambulatory Visit: Payer: Medicaid Other | Admitting: Pulmonary Disease

## 2017-10-16 NOTE — Progress Notes (Deleted)
Subjective:   PATIENT ID: Jason Yu GENDER: male DOB: 05/17/1951, MRN: [id number]   Synopsis: Referred in Dec 2018 for lung nodules, he was diagnosed with COPD-asthma overlap at that time  HPI  No chief complaint on file.  ***  Past Medical History:  Diagnosis Date  . Anxiety   . Asthma    hasn't used inhaler at least 4-5 months.  . Complete heart block [facility])    with prior syncope s/p PPM 2004 in [place] (MDT Kappa);  08/16/2011 PPM Gen. Change: MDT Adapta L ADDDR1, [id number] H.  . Complication of anesthesia 05/2011   had difficulty breathing after surgery=C8 nerve root compression  . DDD (degenerative disc disease)    with multiple prior back surgeries   . GERD (gastroesophageal reflux disease)   . Hepatitis    Hx Hepatitis A & B  . HLD (hyperlipidemia)   . HTN (hypertension), benign   . Pacemaker   . Pneumonia last 1998   has had 8x  . PONV (postoperative nausea and vomiting)   . Shortness of breath   . Syncope    a.  2001 - normal cath;  b. 2012 Normal Myoview, EF 58%.      Review of Systems  Constitutional: Negative for chills, fever, malaise/fatigue and weight loss.  HENT: Positive for congestion and sinus pain. Negative for nosebleeds and sore throat.   Eyes: Negative for photophobia, pain and discharge.  Respiratory: Positive for cough, shortness of breath and wheezing. Negative for hemoptysis and sputum production.   Cardiovascular: Negative for chest pain, palpitations, orthopnea and leg swelling.  Gastrointestinal: Negative for abdominal pain, constipation, diarrhea, nausea and vomiting.  Genitourinary: Negative for dysuria, frequency, hematuria and urgency.  Musculoskeletal: Negative for back pain, joint pain, myalgias and neck pain.  Skin: Negative for itching and rash.  Neurological: Positive for headaches. Negative for tingling, tremors, sensory change, speech change, focal weakness, seizures and weakness.  Psychiatric/Behavioral: Negative for  memory loss, substance abuse and suicidal ideas. The patient is not nervous/anxious.       Objective:  Physical Exam   There were no vitals filed for this visit.  Gen: well appearing, no acute distress HENT: NCAT, OP clear, neck supple without masses Eyes: PERRL, EOMi Lymph: no cervical lymphadenopathy PULM: CTA B, normal effort CV: RRR, no mgr, no JVD GI: BS+, soft, nontender, no hsm Derm: no rash or skin breakdown MSK: diminished bulk/tone of hands bilaterally Neuro: A&Ox4, CN II-XII intact, strength 4/5 in all 4 extremities Psyche: normal mood and affect   CBC    Component Value Date/Time   WBC 6.4 02/21/2012 1227   RBC 4.82 02/21/2012 1227   HGB 14.8 02/21/2012 1227   HCT 42.9 02/21/2012 1227   PLT 231 02/21/2012 1227   MCV 89.0 02/21/2012 1227   MCH 30.7 02/21/2012 1227   MCHC 34.5 02/21/2012 1227   RDW 13.3 02/21/2012 1227   LYMPHSABS 1.2 07/19/2009 2100   MONOABS 0.6 07/19/2009 2100   EOSABS 0.0 07/19/2009 2100   BASOSABS 0.0 07/19/2009 2100     Chest imaging: April 2018 CT chest images independently reviewed showing a 2.3 mm nodule in the right upper lobe and an 8 mm nodule in the left upper lobe, I question whether or not this is a pleural-based lymph node.  PFT: December 2018 spirometry test ratio 65%, FEV1 1.46 L 51% predicted, FVC 2.26 L 59% predicted  Labs:  Path:  Echo:  Heart Catheterization:  Records from his last visit with  our pulmonary NP reviewed: he was doing well on Breo     Assessment & Plan:   No diagnosis found.        Current Outpatient Medications:  .  albuterol (PROVENTIL HFA;VENTOLIN HFA) 108 (90 Base) MCG/ACT inhaler, Inhale 2 puffs into the lungs every 6 (six) hours as needed. For shortness of breath, Disp: 18 g, Rfl: 2 .  amLODipine (NORVASC) 10 MG tablet, Take 0.5 tablets (5 mg total) by mouth daily., Disp: 45 tablet, Rfl: 1 .  Ascorbic Acid (VITAMIN C) 1000 MG tablet, Take 1,000 mg by mouth daily.  , Disp: ,  Rfl:  .  atorvastatin (LIPITOR) 40 MG tablet, TAKE 1 TABLET BY MOUTH DAILY, Disp: 30 tablet, Rfl: 6 .  Cholecalciferol (VITAMIN D) 2000 UNITS CAPS, Take 2,000 Units by mouth daily. , Disp: , Rfl:  .  co-enzyme Q-10 30 MG capsule, Take 30 mg by mouth daily., Disp: , Rfl:  .  diazepam (VALIUM) 10 MG tablet, Take 10 mg by mouth every 8 (eight) hours as needed. For anxiety, Disp: , Rfl:  .  Flaxseed, Linseed, (FLAX SEED OIL) 1000 MG CAPS, Take 4,000 mg by mouth daily., Disp: , Rfl:  .  fluticasone furoate-vilanterol (BREO ELLIPTA) 200-25 MCG/INH AEPB, Inhale 1 puff into the lungs daily., Disp: 60 each, Rfl: 6 .  Glucosamine-Chondroit-Vit C-Mn (GLUCOSAMINE 1500 COMPLEX) CAPS, Take by mouth., Disp: , Rfl:  .  hydrochlorothiazide (HYDRODIURIL) 25 MG tablet, TAKE 1 TABLET BY MOUTH DAILY, Disp: 30 tablet, Rfl: 6 .  HYDROcodone-acetaminophen (NORCO) 10-325 MG per tablet, Take 1 tablet by mouth every 8 (eight) hours as needed for moderate pain. For pain, Disp: , Rfl:  .  HYDROmorphone (DILAUDID) 4 MG tablet, Take 4 mg by mouth 2 (two) times daily. , Disp: , Rfl:  .  Menthol, Topical Analgesic, (BLUE-EMU MAXIMUM STRENGTH EX), Apply topically., Disp: , Rfl:  .  Menthol, Topical Analgesic, (BLUE-EMU MAXIMUM STRENGTH) 2.5 % LIQD, Apply 1 application topically 3 (three) times daily as needed (for muscle pain)., Disp: , Rfl:  .  Multiple Vitamin (MULTIVITAMIN) tablet, Take 1 tablet by mouth daily.  , Disp: , Rfl:  .  omeprazole (PRILOSEC) 20 MG capsule, Take 20 mg by mouth 2 (two) times daily as needed (for heartburn or acid reflux). , Disp: , Rfl:  .  PARoxetine (PAXIL) 20 MG tablet, Take 20 mg by mouth daily. , Disp: , Rfl:  .  vitamin B-12 (CYANOCOBALAMIN) 1000 MCG tablet, Take 1,000 mcg by mouth daily., Disp: , Rfl:  .  vitamin E 1000 UNIT capsule, Take 1,000 Units by mouth daily., Disp: , Rfl:

## 2017-10-19 ENCOUNTER — Other Ambulatory Visit: Payer: Self-pay | Admitting: Cardiology

## 2017-10-27 ENCOUNTER — Ambulatory Visit (INDEPENDENT_AMBULATORY_CARE_PROVIDER_SITE_OTHER): Payer: Medicare Other | Admitting: *Deleted

## 2017-10-27 ENCOUNTER — Telehealth: Payer: Self-pay | Admitting: Cardiology

## 2017-10-27 DIAGNOSIS — I442 Atrioventricular block, complete: Secondary | ICD-10-CM | POA: Diagnosis not present

## 2017-10-27 NOTE — Telephone Encounter (Signed)
Spoke with pt and reminded pt of remote transmission that is due today. Pt verbalized understanding.   

## 2017-10-28 ENCOUNTER — Encounter: Payer: Self-pay | Admitting: Cardiology

## 2017-10-28 NOTE — Progress Notes (Signed)
Remote pacemaker transmission.   

## 2017-10-29 LAB — CUP PACEART REMOTE DEVICE CHECK
Battery Remaining Longevity: 73 mo
Battery Voltage: 2.78 V
Brady Statistic AP VP Percent: 7 %
Brady Statistic AP VS Percent: 0 %
Brady Statistic AS VP Percent: 93 %
Implantable Lead Implant Date: 20040709
Implantable Lead Model: 5076
Implantable Pulse Generator Implant Date: 20130215
Lead Channel Impedance Value: 405 Ohm
Lead Channel Pacing Threshold Amplitude: 0.75 V
Lead Channel Pacing Threshold Amplitude: 1.375 V
Lead Channel Pacing Threshold Pulse Width: 0.4 ms
Lead Channel Pacing Threshold Pulse Width: 0.4 ms
Lead Channel Setting Pacing Amplitude: 2 V
Lead Channel Setting Pacing Pulse Width: 0.4 ms
Lead Channel Setting Sensing Sensitivity: 5.6 mV
MDC IDC LEAD IMPLANT DT: 20040709
MDC IDC LEAD LOCATION: 753859
MDC IDC LEAD LOCATION: 753860
MDC IDC MSMT BATTERY IMPEDANCE: 555 Ohm
MDC IDC MSMT LEADCHNL RV IMPEDANCE VALUE: 527 Ohm
MDC IDC SESS DTM: 20190430021438
MDC IDC SET LEADCHNL RV PACING AMPLITUDE: 2.75 V
MDC IDC STAT BRADY AS VS PERCENT: 0 %

## 2017-11-04 ENCOUNTER — Ambulatory Visit: Payer: Medicaid Other | Admitting: Adult Health

## 2017-11-11 ENCOUNTER — Ambulatory Visit: Payer: Medicaid Other | Admitting: Adult Health

## 2017-11-17 ENCOUNTER — Encounter: Payer: Self-pay | Admitting: Pulmonary Disease

## 2017-11-17 ENCOUNTER — Ambulatory Visit (INDEPENDENT_AMBULATORY_CARE_PROVIDER_SITE_OTHER): Payer: Medicare Other | Admitting: Pulmonary Disease

## 2017-11-17 DIAGNOSIS — J449 Chronic obstructive pulmonary disease, unspecified: Secondary | ICD-10-CM | POA: Diagnosis not present

## 2017-11-17 DIAGNOSIS — J309 Allergic rhinitis, unspecified: Secondary | ICD-10-CM | POA: Diagnosis not present

## 2017-11-17 DIAGNOSIS — R918 Other nonspecific abnormal finding of lung field: Secondary | ICD-10-CM | POA: Diagnosis not present

## 2017-11-17 NOTE — Progress Notes (Signed)
ct 

## 2017-11-17 NOTE — Assessment & Plan Note (Signed)
Follow-up with Dr. Mcquaid in November/2019 >>>Repeat CT without cKendrick Friesast for lung nodules Follow up with any concerns or issues with your breathing Continue with [facility] Ellipta inhaler

## 2017-11-17 NOTE — Assessment & Plan Note (Signed)
Mild AR Flare today  Start Fluticasone 1 spray in each nostril twice a day  >>>for 1 week as needed  >>>patient to get over the counter  Can also do nasal rinses

## 2017-11-17 NOTE — Patient Instructions (Addendum)
Follow-up with Dr. Kendrick Fries in November/2019 >>>Repeat CT without contrast for lung nodules Follow up with any concerns or issues with your breathing Continue with Breo Ellipta inhaler   Please contact the office if your symptoms worsen or you have concerns that you are not improving.   Thank you for choosing [facility] for your healthcare, and for allowing [place] to partner with you on your healthcare journey. I am thankful to be able to provide care to you today.   Elisha Headland FNP-C

## 2017-11-17 NOTE — Progress Notes (Signed)
  ID: Jason Yu, male    DOB: 12/14/1950, 67 y.o.   MRN: [id number]  Chief Complaint  Patient presents with  . Follow-up    4-5 month ROV, COPD     Referring provider: No ref. provider found  HPI: 67 yo male former smoker seen for pulmonary consult for lung nodules 06/05/17 found to have moderate COPD with asthma +THC use   TESTs  Chest imaging: April 2018 CT chest images independently reviewed showing a 2.3 mm nodule in the right upper lobe and an 8 mm nodule in the left upper lobe, I question whether or not this is a pleural-based lymph node. >>>will repeat November 2019 PFT: December 2018 spirometry test ratio 65%, FEV1 1.46 L 51% predicted, FVC 2.26 L 59% predicted  11/17/17 Office Visit  Patient arrives for follow-up appointment.  Patient reporting feeling great having and also 3 months with breathing. Patient attributes this to [facility].  Patient doing well.  No other complaints.   Allergies  Allergen Reactions  . Other Other (See Comments)    Nuclear Stress Medicine- made him feel" out of touch" w/ reality.  . Adhesive [Tape] Other (See Comments)    Pulls skin off, Please use "paper" tape  . Codeine Itching and Rash  . Prednisone Other (See Comments)      pain in joints, high doses    Immunization History  Administered Date(s) Administered  . Influenza Split 04/17/2017  . Influenza,inj,Quad PF,6+ Mos 05/17/2014    Past Medical History:  Diagnosis Date  . Anxiety   . Asthma    hasn't used inhaler at least 4-5 months.  . Complete heart block [facility])    with prior syncope s/p PPM 2004 in [place] (MDT Kappa);  08/16/2011 PPM Gen. Change: MDT Adapta L ADDDR1, [id number] H.  . Complication of anesthesia 05/2011   had difficulty breathing after surgery=C8 nerve root compression  . DDD (degenerative disc disease)    with multiple prior back surgeries   . GERD (gastroesophageal reflux disease)   . Hepatitis    Hx Hepatitis A & B  . HLD  (hyperlipidemia)   . HTN (hypertension), benign   . Pacemaker   . Pneumonia last 1998   has had 8x  . PONV (postoperative nausea and vomiting)   . Shortness of breath   . Syncope    a.  2001 - normal cath;  b. 2012 Normal Myoview, EF 58%.    Tobacco History: Social History   Tobacco Use  Smoking Status Former Smoker  . Packs/day: 2.00  . Years: 3.00  . Pack years: 6.00  . Types: Cigarettes  . Start date: 07/11/1968  . Last attempt to quit: 11/29/1977  . Years since quitting: 39.9  Smokeless Tobacco Never Used   Counseling given: Not Answered   Outpatient Encounter Medications as of 11/17/2017  Medication Sig  . albuterol (PROVENTIL HFA;VENTOLIN HFA) 108 (90 Base) MCG/ACT inhaler Inhale 2 puffs into the lungs every 6 (six) hours as needed. For shortness of breath  . amLODipine (NORVASC) 10 MG tablet Take 0.5 tablets (5 mg total) by mouth daily.  . Ascorbic Acid (VITAMIN C) 1000 MG tablet Take 1,000 mg by mouth daily.    Marland Kitchen atorvastatin (LIPITOR) 40 MG tablet TAKE 1 TABLET BY MOUTH DAILY  . Cholecalciferol (VITAMIN D) 2000 UNITS CAPS Take 2,000 Units by mouth daily.   Marland Kitchen co-enzyme Q-10 30 MG capsule Take 30 mg by mouth daily.  . diazepam (VALIUM) 10 MG tablet Take  10 mg by mouth every 8 (eight) hours as needed. For anxiety  . Flaxseed, Linseed, (FLAX SEED OIL) 1000 MG CAPS Take 4,000 mg by mouth daily.  . fluticasone furoate-vilanterol (BREO ELLIPTA) 200-25 MCG/INH AEPB Inhale 1 puff into the lungs daily.  . Glucosamine-Chondroit-Vit C-Mn (GLUCOSAMINE 1500 COMPLEX) CAPS Take by mouth.  . hydrochlorothiazide (HYDRODIURIL) 25 MG tablet TAKE 1 TABLET BY MOUTH DAILY  . HYDROcodone-acetaminophen (NORCO) 10-325 MG per tablet Take 1 tablet by mouth every 8 (eight) hours as needed for moderate pain. For pain  . HYDROmorphone (DILAUDID) 4 MG tablet Take 4 mg by mouth 2 (two) times daily.   . Menthol, Topical Analgesic, (BLUE-EMU MAXIMUM STRENGTH EX) Apply topically.  . Menthol, Topical  Analgesic, (BLUE-EMU MAXIMUM STRENGTH) 2.5 % LIQD Apply 1 application topically 3 (three) times daily as needed (for muscle pain).  . Multiple Vitamin (MULTIVITAMIN) tablet Take 1 tablet by mouth daily.    Marland Kitchen omeprazole (PRILOSEC) 20 MG capsule Take 20 mg by mouth 2 (two) times daily as needed (for heartburn or acid reflux).   Marland Kitchen PARoxetine (PAXIL) 20 MG tablet Take 20 mg by mouth daily.   . vitamin B-12 (CYANOCOBALAMIN) 1000 MCG tablet Take 1,000 mcg by mouth daily.  . vitamin E 1000 UNIT capsule Take 1,000 Units by mouth daily.   No facility-administered encounter medications on file as of 11/17/2017.      Review of Systems  Constitutional:   No  weight loss, night sweats,  Fevers, chills, fatigue, or  lassitude.  HEENT:   +post nasal drip No headaches,  Difficulty swallowing,  Tooth/dental problems, or  Sore throat, No sneezing, itching, ear ache  CV:  No chest pain,  Orthopnea, PND, swelling in lower extremities, anasarca, dizziness, palpitations, syncope.   GI:  No heartburn, indigestion, abdominal pain, nausea, vomiting, diarrhea, change in bowel habits, loss of appetite, bloody stools.   Resp: No shortness of breath with exertion or at rest.  No excess mucus, no productive cough,  No non-productive cough,  No coughing up of blood.  No change in color of mucus.  No wheezing.  No chest wall deformity  Skin: no rash or lesions.  GU: no dysuria, change in color of urine, no urgency or frequency.  No flank pain, no hematuria   MS:  No joint pain or swelling.  No decreased range of motion.  No back pain.    Physical Exam  BP 122/70 (BP Location: Left Arm, Cuff Size: Normal)   Pulse 66   Ht  (1.651 m)   Wt 169 lb 12.8 oz (77 kg)   SpO2 97%   BMI 28.26 kg/m   GEN: A/Ox3; pleasant , NAD, well nourished    HEENT:  [place]/AT,  EACs-clear, TMs-wnl, NOSE-clear, THROAT- +post nasal drip, clear, no lesions   NECK:  Supple w/ fair ROM; no JVD; no thyromegaly or nodules palpated; no  lymphadenopathy.    RESP  Clear  P & A with good air movement in all lobes; w/o, wheezes/ rales/ or rhonchi. no accessory muscle use, no dullness to percussion  CARD:  RRR, no m/r/g, no peripheral edema, pulses intact, no cyanosis or clubbing.  GI:   Soft & nt; nml bowel sounds; no organomegaly or masses detected.   Musco: Warm bil, no deformities or joint swelling noted.   Neuro: alert, no focal deficits noted.    Skin: Warm, no lesions or rashes    Lab Results:  CBC    Component Value Date/Time  WBC 6.4 02/21/2012 1227   RBC 4.82 02/21/2012 1227   HGB 14.8 02/21/2012 1227   HCT 42.9 02/21/2012 1227   PLT 231 02/21/2012 1227   MCV 89.0 02/21/2012 1227   MCH 30.7 02/21/2012 1227   MCHC 34.5 02/21/2012 1227   RDW 13.3 02/21/2012 1227   LYMPHSABS 1.2 07/19/2009 2100   MONOABS 0.6 07/19/2009 2100   EOSABS 0.0 07/19/2009 2100   BASOSABS 0.0 07/19/2009 2100    BMET    Component Value Date/Time   NA 137 02/21/2012 1227   K 4.6 02/21/2012 1227   CL 102 02/21/2012 1227   CO2 25 02/21/2012 1227   GLUCOSE 110 (H) 02/21/2012 1227   BUN 13 02/21/2012 1227   CREATININE 0.75 02/21/2012 1227   CALCIUM 9.7 02/21/2012 1227   GFRNONAA >90 02/21/2012 1227   GFRAA >90 02/21/2012 1227    BNP No results found for: BNP  ProBNP No results found for: PROBNP  Imaging: No results found.   Assessment & Plan:   COPD with asthma (HCC) Follow-up with Dr. Kendrick Fries in November/2019 >>>Repeat CT without contrast for lung nodules Follow up with any concerns or issues with your breathing Continue with Breo Ellipta inhaler   Lung nodules Follow-up with Dr. Kendrick Fries in November/2019 >>>Repeat CT without contrast for lung nodules - 05/2018 Follow up with any concerns or issues with your breathing    Allergic rhinitis Mild AR Flare today  Start Fluticasone 1 spray in each nostril twice a day  >>>for 1 week as needed  >>>patient to get over the counter  Can also do nasal  rinses      Coral Ceo, NP 11/17/2017

## 2017-11-17 NOTE — Assessment & Plan Note (Addendum)
Follow-up with Dr. Kendrick Fries in November/2019 >>>Repeat CT without contrast for lung nodules - 05/2018 Follow up with any concerns or issues with your breathing

## 2017-11-18 NOTE — Progress Notes (Signed)
Reviewed, agree 

## 2018-01-12 ENCOUNTER — Other Ambulatory Visit: Payer: Self-pay | Admitting: Adult Health

## 2018-01-26 ENCOUNTER — Ambulatory Visit (INDEPENDENT_AMBULATORY_CARE_PROVIDER_SITE_OTHER): Payer: Medicare Other | Admitting: *Deleted

## 2018-01-26 ENCOUNTER — Telehealth: Payer: Self-pay

## 2018-01-26 DIAGNOSIS — I442 Atrioventricular block, complete: Secondary | ICD-10-CM | POA: Diagnosis not present

## 2018-01-26 NOTE — Telephone Encounter (Signed)
I spoke with the patient about this.

## 2018-01-27 ENCOUNTER — Encounter: Payer: Self-pay | Admitting: Cardiology

## 2018-01-27 NOTE — Progress Notes (Signed)
Remote pacemaker transmission.   

## 2018-01-30 ENCOUNTER — Encounter: Payer: Medicare Other | Admitting: Internal Medicine

## 2018-01-30 LAB — CUP PACEART REMOTE DEVICE CHECK
Battery Impedance: 606 Ohm
Battery Remaining Longevity: 75 mo
Battery Voltage: 2.78 V
Brady Statistic AP VP Percent: 11 %
Brady Statistic AP VS Percent: 0 %
Brady Statistic AS VP Percent: 89 %
Brady Statistic AS VS Percent: 0 %
Date Time Interrogation Session: 20190729210658
Implantable Lead Implant Date: 20040709
Implantable Lead Implant Date: 20040709
Implantable Lead Location: 753859
Implantable Lead Location: 753860
Implantable Lead Model: 5076
Implantable Lead Model: 5076
Implantable Pulse Generator Implant Date: 20130215
Lead Channel Impedance Value: 416 Ohm
Lead Channel Impedance Value: 527 Ohm
Lead Channel Pacing Threshold Amplitude: 0.625 V
Lead Channel Pacing Threshold Amplitude: 1.25 V
Lead Channel Pacing Threshold Pulse Width: 0.4 ms
Lead Channel Pacing Threshold Pulse Width: 0.4 ms
Lead Channel Sensing Intrinsic Amplitude: 2.8 mV
Lead Channel Setting Pacing Amplitude: 2 V
Lead Channel Setting Pacing Amplitude: 2.5 V
Lead Channel Setting Pacing Pulse Width: 0.4 ms
Lead Channel Setting Sensing Sensitivity: 5.6 mV

## 2018-02-07 ENCOUNTER — Other Ambulatory Visit: Payer: Self-pay | Admitting: Pulmonary Disease

## 2018-02-09 ENCOUNTER — Ambulatory Visit (INDEPENDENT_AMBULATORY_CARE_PROVIDER_SITE_OTHER): Payer: Medicare Other | Admitting: Otolaryngology

## 2018-02-09 DIAGNOSIS — H9313 Tinnitus, bilateral: Secondary | ICD-10-CM

## 2018-02-09 DIAGNOSIS — H903 Sensorineural hearing loss, bilateral: Secondary | ICD-10-CM | POA: Diagnosis not present

## 2018-02-20 ENCOUNTER — Other Ambulatory Visit: Payer: Self-pay | Admitting: *Deleted

## 2018-02-20 MED ORDER — AMLODIPINE BESYLATE 10 MG PO TABS: 5.0000 mg | ORAL_TABLET | Freq: Every day | ORAL | 1 refills | Status: DC

## 2018-03-17 ENCOUNTER — Other Ambulatory Visit: Payer: Self-pay | Admitting: Cardiology

## 2018-03-31 ENCOUNTER — Other Ambulatory Visit: Payer: Medicare Other

## 2018-03-31 ENCOUNTER — Telehealth: Payer: Self-pay | Admitting: Pulmonary Disease

## 2018-03-31 NOTE — Telephone Encounter (Signed)
Attempted to call pt. I did not receive an answer. I have left a message for pt to return our call.  

## 2018-04-01 NOTE — Telephone Encounter (Signed)
Called and spoke to patient, he reports he has one more dose of prednisone, and 4 antibiotics left, he states his throat still burns a little and he is still using the magic mouthwash. He is using mucinex daily. Patient would like to know when he can start back his Breo or will he need to change something else due to the thrush in his mouth.   BQ please advise.

## 2018-04-01 NOTE — Telephone Encounter (Signed)
OK to resume Breo, let us know if thrush returns

## 2018-04-02 NOTE — Telephone Encounter (Signed)
Called and spoke with patient. Verbalized an understanding.  Patient will contact us if symptoms returns or doesn't feel better after re-starting Breo.  Nothing further needed.

## 2018-04-14 ENCOUNTER — Other Ambulatory Visit: Payer: Medicare Other

## 2018-04-27 ENCOUNTER — Telehealth: Payer: Self-pay | Admitting: Cardiology

## 2018-04-27 ENCOUNTER — Ambulatory Visit (INDEPENDENT_AMBULATORY_CARE_PROVIDER_SITE_OTHER): Payer: Medicare Other | Admitting: *Deleted

## 2018-04-27 DIAGNOSIS — I442 Atrioventricular block, complete: Secondary | ICD-10-CM | POA: Diagnosis not present

## 2018-04-27 DIAGNOSIS — I10 Essential (primary) hypertension: Secondary | ICD-10-CM

## 2018-04-27 NOTE — Telephone Encounter (Signed)
Spoke with pt and reminded pt of remote transmission that is due today. Pt verbalized understanding.   

## 2018-04-28 ENCOUNTER — Inpatient Hospital Stay: Admission: RE | Admit: 2018-04-28 | Payer: Medicare Other | Source: Ambulatory Visit

## 2018-04-28 LAB — CUP PACEART REMOTE DEVICE CHECK
Battery Impedance: 656 Ohm
Battery Voltage: 2.78 V
Brady Statistic AP VS Percent: 0 %
Brady Statistic AS VP Percent: 88 %
Implantable Lead Location: 753859
Implantable Lead Model: 5076
Implantable Lead Model: 5076
Lead Channel Pacing Threshold Amplitude: 0.75 V
Lead Channel Pacing Threshold Amplitude: 1.375 V
Lead Channel Pacing Threshold Pulse Width: 0.4 ms
Lead Channel Pacing Threshold Pulse Width: 0.4 ms
Lead Channel Setting Pacing Amplitude: 2.75 V
Lead Channel Setting Pacing Pulse Width: 0.4 ms
MDC IDC LEAD IMPLANT DT: 20040709
MDC IDC LEAD IMPLANT DT: 20040709
MDC IDC LEAD LOCATION: 753860
MDC IDC MSMT BATTERY REMAINING LONGEVITY: 67 mo
MDC IDC MSMT LEADCHNL RA IMPEDANCE VALUE: 389 Ohm
MDC IDC MSMT LEADCHNL RV IMPEDANCE VALUE: 515 Ohm
MDC IDC PG IMPLANT DT: 20130215
MDC IDC SESS DTM: 20191029011936
MDC IDC SET LEADCHNL RA PACING AMPLITUDE: 2 V
MDC IDC SET LEADCHNL RV SENSING SENSITIVITY: 5.6 mV
MDC IDC STAT BRADY AP VP PERCENT: 12 %
MDC IDC STAT BRADY AS VS PERCENT: 0 %

## 2018-04-28 NOTE — Progress Notes (Signed)
Remote pacemaker transmission.   

## 2018-05-01 ENCOUNTER — Encounter: Payer: Self-pay | Admitting: Internal Medicine

## 2018-05-01 ENCOUNTER — Ambulatory Visit (INDEPENDENT_AMBULATORY_CARE_PROVIDER_SITE_OTHER): Payer: Medicare Other | Admitting: Internal Medicine

## 2018-05-01 VITALS — BP 110/61 | HR 63 | Ht 65.0 in | Wt 173.0 lb

## 2018-05-01 DIAGNOSIS — I10 Essential (primary) hypertension: Secondary | ICD-10-CM

## 2018-05-01 DIAGNOSIS — Z950 Presence of cardiac pacemaker: Secondary | ICD-10-CM

## 2018-05-01 DIAGNOSIS — I442 Atrioventricular block, complete: Secondary | ICD-10-CM

## 2018-05-01 LAB — CUP PACEART INCLINIC DEVICE CHECK
Battery Impedance: 631 Ohm
Battery Remaining Longevity: 68 mo
Battery Voltage: 2.78 V
Brady Statistic AP VP Percent: 12 %
Brady Statistic AS VP Percent: 88 %
Brady Statistic AS VS Percent: 0 %
Date Time Interrogation Session: 20191101152211
Implantable Lead Implant Date: 20040709
Implantable Lead Location: 753860
Implantable Lead Model: 5076
Implantable Lead Model: 5076
Implantable Pulse Generator Implant Date: 20130215
Lead Channel Impedance Value: 515 Ohm
Lead Channel Pacing Threshold Amplitude: 0.75 V
Lead Channel Pacing Threshold Amplitude: 1.375 V
Lead Channel Pacing Threshold Pulse Width: 0.4 ms
Lead Channel Pacing Threshold Pulse Width: 0.4 ms
Lead Channel Setting Pacing Amplitude: 2.75 V
Lead Channel Setting Pacing Pulse Width: 0.4 ms
Lead Channel Setting Sensing Sensitivity: 5.6 mV
MDC IDC LEAD IMPLANT DT: 20040709
MDC IDC LEAD LOCATION: 753859
MDC IDC MSMT LEADCHNL RA IMPEDANCE VALUE: 399 Ohm
MDC IDC MSMT LEADCHNL RA PACING THRESHOLD AMPLITUDE: 0.625 V
MDC IDC MSMT LEADCHNL RA SENSING INTR AMPL: 2.8 mV
MDC IDC MSMT LEADCHNL RV PACING THRESHOLD AMPLITUDE: 1.25 V
MDC IDC MSMT LEADCHNL RV PACING THRESHOLD PULSEWIDTH: 0.4 ms
MDC IDC MSMT LEADCHNL RV PACING THRESHOLD PULSEWIDTH: 0.4 ms
MDC IDC SET LEADCHNL RA PACING AMPLITUDE: 2 V
MDC IDC STAT BRADY AP VS PERCENT: 0 %

## 2018-05-01 NOTE — Patient Instructions (Signed)
Medication Instructions:  Continue all current medications.  Labwork: none  Testing/Procedures: none  Follow-Up: 1 year   Any Other Special Instructions Will Be Listed Below (If Applicable). Remote monitoring is used to monitor your Pacemaker of ICD from home. This monitoring reduces the number of office visits required to check your device to one time per year. It allows [place] to keep an eye on the functioning of your device to ensure it is working properly. You are scheduled for a device check from home on 07/27/18. You may send your transmission at any time that day. If you have a wireless device, the transmission will be sent automatically. After your physician reviews your transmission, you will receive a postcard with your next transmission date.  If you need a refill on your cardiac medications before your next appointment, please call your pharmacy.

## 2018-05-01 NOTE — Progress Notes (Signed)
PCP: Default, Provider, MD Primary Cardiologist: Dr Wyline Mood Primary EP:  Dr Johney Frame  Jason Yu is a 67 y.o. male who presents today for routine electrophysiology followup.  Since last being seen in our clinic, the patient reports doing reasonably well.  Continues to complain of back pain and shows multiple pictures from his phone of his back MRI. Stable SOB with COPD.  Continues to smoke marijuana despite me advising that he quit multiple times (including today).  No cardiac complaints.  Today, he denies symptoms of palpitations, chest pain,  lower extremity edema, dizziness, presyncope, or syncope.  The patient is otherwise without complaint today.   Past Medical History:  Diagnosis Date  . Anxiety   . Asthma    hasn't used inhaler at least 4-5 months.  . Complete heart block [facility])    with prior syncope s/p PPM 2004 in [place] (MDT Kappa);  08/16/2011 PPM Gen. Change: MDT Adapta L ADDDR1, [id number] H.  . Complication of anesthesia 05/2011   had difficulty breathing after surgery=C8 nerve root compression  . DDD (degenerative disc disease)    with multiple prior back surgeries   . GERD (gastroesophageal reflux disease)   . Hepatitis    Hx Hepatitis A & B  . HLD (hyperlipidemia)   . HTN (hypertension), benign   . Pacemaker   . Pneumonia last 1998   has had 8x  . PONV (postoperative nausea and vomiting)   . Shortness of breath   . Syncope    a.  2001 - normal cath;  b. 2012 Normal Myoview, EF 58%.   Past Surgical History:  Procedure Laterality Date  . BACK SURGERY  08/2009, 09/2011  . CARDIAC CATHETERIZATION  2001   no significant CAD  . CARPAL TUNNEL RELEASE  1979  . cervical neck surgeries    . INSERT / REPLACE / REMOVE PACEMAKER  08/2011   initially inserted 12/2002  . pacemaker generator change  08/16/11   Medtronic Adapta L implanted by Dr Johney Frame, lead implangted 2004 in [place]  . PACEMAKER GENERATOR CHANGE Left 08/16/2011   Procedure: PACEMAKER GENERATOR CHANGE;   Surgeon: Hillis Range, MD;  Location: [facility] CATH LAB;  Service: Cardiovascular;  Laterality: Left;  . PACEMAKER INSERTION     Implanted 2004 in [place]  . SHOULDER ARTHROSCOPY DISTAL CLAVICLE EXCISION AND OPEN ROTATOR CUFF REPAIR  2009   right  . SHOULDER ARTHROSCOPY DISTAL CLAVICLE EXCISION AND OPEN ROTATOR CUFF REPAIR  2010   Left  . ULNAR TUNNEL RELEASE  1980's(late) or early 1990's    ROS- all systems are reviewed and negative except as per HPI above  Current Outpatient Medications  Medication Sig Dispense Refill  . amLODipine (NORVASC) 10 MG tablet Take 0.5 tablets (5 mg total) by mouth daily. 45 tablet 1  . Ascorbic Acid (VITAMIN C) 1000 MG tablet Take 1,000 mg by mouth daily.      Marland Kitchen atorvastatin (LIPITOR) 40 MG tablet TAKE 1 TABLET BY MOUTH DAILY 90 tablet 1  . BREO ELLIPTA 200-25 MCG/INH AEPB INHALE 1 PUFF INTO LUNGS DAILY 60 each 6  . Cholecalciferol (VITAMIN D) 2000 UNITS CAPS Take 2,000 Units by mouth daily.     Marland Kitchen co-enzyme Q-10 30 MG capsule Take 30 mg by mouth daily.    . diazepam (VALIUM) 10 MG tablet Take 10 mg by mouth every 8 (eight) hours as needed. For anxiety    . Flaxseed, Linseed, (FLAX SEED OIL) 1000 MG CAPS Take 4,000 mg by mouth daily.    Marland Kitchen  hydrochlorothiazide (HYDRODIURIL) 25 MG tablet TAKE 1 TABLET BY MOUTH DAILY 90 tablet 1  . HYDROcodone-acetaminophen (NORCO) 10-325 MG per tablet Take 1 tablet by mouth every 6 (six) hours as needed for moderate pain. For pain    . HYDROmorphone (DILAUDID) 4 MG tablet Take 4 mg by mouth 3 (three) times daily as needed.     . Menthol, Topical Analgesic, (BLUE-EMU MAXIMUM STRENGTH) 2.5 % LIQD Apply 1 application topically 3 (three) times daily as needed (for muscle pain).    . Multiple Vitamin (MULTIVITAMIN) tablet Take 1 tablet by mouth daily.      Marland Kitchen omeprazole (PRILOSEC) 20 MG capsule Take 20 mg by mouth 2 (two) times daily as needed (for heartburn or acid reflux).     Marland Kitchen PARoxetine (PAXIL) 20 MG tablet Take 20 mg by mouth daily.      . VENTOLIN HFA 108 (90 Base) MCG/ACT inhaler INHALE TWO PUFFS BY MOUTH EVERY 6 HOURS AS NEEDED SHORTNESS OF BREATH 18 g 2  . vitamin B-12 (CYANOCOBALAMIN) 1000 MCG tablet Take 1,000 mcg by mouth daily.    . vitamin E 1000 UNIT capsule Take 1,000 Units by mouth daily.     No current facility-administered medications for this visit.     Physical Exam: Vitals:   05/01/18 1131  BP: 110/61  Pulse: 63  SpO2: 92%  Weight: 173 lb (78.5 kg)  Height: 5\' 5"  (1.651 m)    GEN- The patient is well appearing, alert and oriented x 3 today.   Head- normocephalic, atraumatic Eyes-  Sclera clear, conjunctiva pink Ears- hearing intact Oropharynx- clear Lungs- Clear to ausculation bilaterally, normal work of breathing Chest- pacemaker pocket is well healed Heart- Regular rate and rhythm, no murmurs, rubs or gallops, PMI not laterally displaced GI- soft, NT, ND, + BS Extremities- no clubbing, cyanosis, or edema  Pacemaker interrogation- reviewed in detail today,  See PACEART report   Assessment and Plan:  1. Symptomatic complete heart block Normal pacemaker function See Pace Art report No changes today  2. HTN Stable No change required today  [facility] Return in a year   Hillis Range MD, [facility] 05/01/2018 12:04 PM

## 2018-05-03 ENCOUNTER — Other Ambulatory Visit: Payer: Self-pay | Admitting: Pulmonary Disease

## 2018-05-14 ENCOUNTER — Ambulatory Visit (INDEPENDENT_AMBULATORY_CARE_PROVIDER_SITE_OTHER)
Admission: RE | Admit: 2018-05-14 | Discharge: 2018-05-14 | Disposition: A | Discharge status: Home / Self Care | Payer: Medicare Other | Source: Ambulatory Visit | Attending: Pulmonary Disease | Admitting: Pulmonary Disease

## 2018-05-14 DIAGNOSIS — R918 Other nonspecific abnormal finding of lung field: Secondary | ICD-10-CM | POA: Diagnosis not present

## 2018-05-19 ENCOUNTER — Telehealth: Payer: Self-pay | Admitting: Pulmonary Disease

## 2018-05-19 NOTE — Telephone Encounter (Signed)
Patient left cell phone #  [phone]

## 2018-05-19 NOTE — Telephone Encounter (Signed)
Called patient unable to reach left message to give us a call back.

## 2018-05-21 NOTE — Telephone Encounter (Signed)
Saw in the results tab where pt had been able to be reached to give him the results of the CT scan. Nothing further needed.

## 2018-06-04 ENCOUNTER — Ambulatory Visit: Payer: Medicare Other | Admitting: Cardiology

## 2018-07-16 ENCOUNTER — Ambulatory Visit: Payer: Medicare Other | Admitting: Cardiology

## 2018-07-16 NOTE — Progress Notes (Deleted)
Clinical Summary [name] is a 68 y.o.male seen today for follow up of the following medical problems.   1. Complete heart block  - s/p pacemaker placement 2004  - normal function by device check during 12/2016 EP appt  - denies any significant lightheadedness or dizziness, no palps, no syncope   05/2018 normal device check   2. HTN   - home bp's 110s/70s - compliant with meds   3. HL  - he is compliant with statin. Recent labs with pcp.   4. Aortic atherosclerosis - atherosclerosis noted by CT scan, also some CAD    [facility] Past Medical History:  Diagnosis Date  . Anxiety   . Asthma    hasn't used inhaler at least 4-5 months.  . Complete heart block [facility](HCC)    with prior syncope s/p PPM 2004 in [place] (MDT Kappa);  08/16/2011 PPM Gen. Change: MDT Adapta L ADDDR1, [id number] H.  . Complication of anesthesia 05/2011   had difficulty breathing after surgery=C8 nerve root compression  . DDD (degenerative disc disease)    with multiple prior back surgeries   . GERD (gastroesophageal reflux disease)   . Hepatitis    Hx Hepatitis A & B  . HLD (hyperlipidemia)   . HTN (hypertension), benign   . Pacemaker   . Pneumonia last 1998   has had 8x  . PONV (postoperative nausea and vomiting)   . Shortness of breath   . Syncope    a.  2001 - normal cath;  b. 2012 Normal Myoview, EF 58%.     Allergies  Allergen Reactions  . Other Other (See Comments)    Nuclear Stress Medicine- made him feel" out of touch" w/ reality.  . Adhesive [Tape] Other (See Comments)    Pulls skin off, Please use "paper" tape  . Codeine Itching and Rash  . Prednisone Other (See Comments)      pain in joints, high doses     Current Outpatient Medications  Medication Sig Dispense Refill  . amLODipine (NORVASC) 10 MG tablet Take 0.5 tablets (5 mg total) by mouth daily. 45 tablet 1  . Ascorbic Acid (VITAMIN C) 1000 MG tablet Take 1,000 mg by mouth daily.      Marland Kitchen.  atorvastatin (LIPITOR) 40 MG tablet TAKE 1 TABLET BY MOUTH DAILY 90 tablet 1  . BREO ELLIPTA 200-25 MCG/INH AEPB INHALE 1 PUFF INTO LUNGS DAILY 60 each 6  . Cholecalciferol (VITAMIN D) 2000 UNITS CAPS Take 2,000 Units by mouth daily.     Marland Kitchen. co-enzyme Q-10 30 MG capsule Take 30 mg by mouth daily.    . diazepam (VALIUM) 10 MG tablet Take 10 mg by mouth every 8 (eight) hours as needed. For anxiety    . Flaxseed, Linseed, (FLAX SEED OIL) 1000 MG CAPS Take 4,000 mg by mouth daily.    . hydrochlorothiazide (HYDRODIURIL) 25 MG tablet TAKE 1 TABLET BY MOUTH DAILY 90 tablet 1  . HYDROcodone-acetaminophen (NORCO) 10-325 MG per tablet Take 1 tablet by mouth every 6 (six) hours as needed for moderate pain. For pain    . HYDROmorphone (DILAUDID) 4 MG tablet Take 4 mg by mouth 3 (three) times daily as needed.     . Menthol, Topical Analgesic, (BLUE-EMU MAXIMUM STRENGTH) 2.5 % LIQD Apply 1 application topically 3 (three) times daily as needed (for muscle pain).    . Multiple Vitamin (MULTIVITAMIN) tablet Take 1 tablet by mouth daily.      .Marland Kitchen  omeprazole (PRILOSEC) 20 MG capsule Take 20 mg by mouth 2 (two) times daily as needed (for heartburn or acid reflux).     Marland Kitchen PARoxetine (PAXIL) 20 MG tablet Take 20 mg by mouth daily.     . VENTOLIN HFA 108 (90 Base) MCG/ACT inhaler INHALE TWO PUFFS BY MOUTH EVERY 6 HOURS AS NEEDED SHORTNESS OF BREATH 18 g 2  . vitamin B-12 (CYANOCOBALAMIN) 1000 MCG tablet Take 1,000 mcg by mouth daily.    . vitamin E 1000 UNIT capsule Take 1,000 Units by mouth daily.     No current facility-administered medications for this visit.      Past Surgical History:  Procedure Laterality Date  . BACK SURGERY  08/2009, 09/2011  . CARDIAC CATHETERIZATION  2001   no significant CAD  . CARPAL TUNNEL RELEASE  1979  . cervical neck surgeries    . INSERT / REPLACE / REMOVE PACEMAKER  08/2011   initially inserted 12/2002  . pacemaker generator change  08/16/11   Medtronic Adapta L implanted by Dr  Johney Frame, lead implangted 2004 in [place]  . PACEMAKER GENERATOR CHANGE Left 08/16/2011   Procedure: PACEMAKER GENERATOR CHANGE;  Surgeon: Hillis Range, MD;  Location: [facility] CATH LAB;  Service: Cardiovascular;  Laterality: Left;  . PACEMAKER INSERTION     Implanted 2004 in [place]  . SHOULDER ARTHROSCOPY DISTAL CLAVICLE EXCISION AND OPEN ROTATOR CUFF REPAIR  2009   right  . SHOULDER ARTHROSCOPY DISTAL CLAVICLE EXCISION AND OPEN ROTATOR CUFF REPAIR  2010   Left  . ULNAR TUNNEL RELEASE  1980's(late) or early 1990's     Allergies  Allergen Reactions  . Other Other (See Comments)    Nuclear Stress Medicine- made him feel" out of touch" w/ reality.  . Adhesive [Tape] Other (See Comments)    Pulls skin off, Please use "paper" tape  . Codeine Itching and Rash  . Prednisone Other (See Comments)      pain in joints, high doses      Family History  Problem Relation Age of Onset  . Diabetes Neg Hx   . Hypertension Neg Hx   . Coronary artery disease Neg Hx      Social History [name] reports that he quit smoking about 40 years ago. His smoking use included cigarettes. He started smoking about 50 years ago. He has a 6.00 pack-year smoking history. He has never used smokeless tobacco. [name] reports no history of alcohol use.   Review of Systems CONSTITUTIONAL: No weight loss, fever, chills, weakness or fatigue.  HEENT: Eyes: No visual loss, blurred vision, double vision or yellow sclerae.No hearing loss, sneezing, congestion, runny nose or sore throat.  SKIN: No rash or itching.  CARDIOVASCULAR:  RESPIRATORY: No shortness of breath, cough or sputum.  GASTROINTESTINAL: No anorexia, nausea, vomiting or diarrhea. No abdominal pain or blood.  GENITOURINARY: No burning on urination, no polyuria NEUROLOGICAL: No headache, dizziness, syncope, paralysis, ataxia, numbness or tingling in the extremities. No change in bowel or bladder control.  MUSCULOSKELETAL: No muscle, back pain, joint  pain or stiffness.  LYMPHATICS: No enlarged nodes. No history of splenectomy.  PSYCHIATRIC: No history of depression or anxiety.  ENDOCRINOLOGIC: No reports of sweating, cold or heat intolerance. No polyuria or polydipsia.  Marland Kitchen   Physical Examination There were no vitals filed for this visit. There were no vitals filed for this visit.  Gen: resting comfortably, no acute distress HEENT: no scleral icterus, pupils equal round and reactive, no palptable cervical adenopathy,  CV Resp: Clear to auscultation bilaterally GI: abdomen is soft, non-tender, non-distended, normal bowel sounds, no hepatosplenomegaly MSK: extremities are warm, no edema.  Skin: warm, no rash Neuro:  no focal deficits Psych: appropriate affect   Diagnostic Studies     Assessment and Plan   1. Complete heart block  - asymptomatic, continue to follow in device clinic - ekg today A-sensed V-paced  2. HTN  - bp is at goal, continue current meds  3. Hyperlipidemia - continue statin, request labs from pcp      Antoine PocheJonathan F. Zachary Nole, M.D., F.A.C.C.

## 2018-07-24 ENCOUNTER — Other Ambulatory Visit: Payer: Self-pay | Admitting: Pulmonary Disease

## 2018-07-29 ENCOUNTER — Other Ambulatory Visit: Payer: Self-pay | Admitting: Pulmonary Disease

## 2018-08-07 ENCOUNTER — Encounter: Payer: Self-pay | Admitting: Cardiology

## 2018-08-07 ENCOUNTER — Ambulatory Visit (INDEPENDENT_AMBULATORY_CARE_PROVIDER_SITE_OTHER): Payer: Medicare HMO | Admitting: Cardiology

## 2018-08-07 VITALS — BP 146/72 | HR 60 | Ht 65.0 in | Wt 177.6 lb

## 2018-08-07 DIAGNOSIS — I10 Essential (primary) hypertension: Secondary | ICD-10-CM

## 2018-08-07 DIAGNOSIS — I442 Atrioventricular block, complete: Secondary | ICD-10-CM

## 2018-08-07 DIAGNOSIS — E782 Mixed hyperlipidemia: Secondary | ICD-10-CM

## 2018-08-07 DIAGNOSIS — Z950 Presence of cardiac pacemaker: Secondary | ICD-10-CM | POA: Diagnosis not present

## 2018-08-07 NOTE — Patient Instructions (Addendum)

## 2018-08-07 NOTE — Progress Notes (Signed)
Clinical Summary Mr. Annice NeedyGauldin is a 68 y.o.male seen today for follow up of the following medical problems.   1. Complete heart block  - s/p pacemaker placement 2004   - normal pacemaker check 05/2018 - no recent symptoms.     2. HTN  - his home bp's 110s/70s - he is compliant with meds   3. HL  - compliant with statin, last labs by pcp   Past Medical History:  Diagnosis Date  . Anxiety   . Asthma    hasn't used inhaler at least 4-5 months.  . Complete heart block [facility](HCC)    with prior syncope s/p PPM 2004 in [place] (MDT Kappa);  08/16/2011 PPM Gen. Change: MDT Adapta L ADDDR1, [id number] H.  . Complication of anesthesia 05/2011   had difficulty breathing after surgery=C8 nerve root compression  . DDD (degenerative disc disease)    with multiple prior back surgeries   . GERD (gastroesophageal reflux disease)   . Hepatitis    Hx Hepatitis A & B  . HLD (hyperlipidemia)   . HTN (hypertension), benign   . Pacemaker   . Pneumonia last 1998   has had 8x  . PONV (postoperative nausea and vomiting)   . Shortness of breath   . Syncope    a.  2001 - normal cath;  b. 2012 Normal Myoview, EF 58%.     Allergies  Allergen Reactions  . Other Other (See Comments)    Nuclear Stress Medicine- made him feel" out of touch" w/ reality.  . Adhesive [Tape] Other (See Comments)    Pulls skin off, Please use "paper" tape  . Codeine Itching and Rash  . Prednisone Other (See Comments)      pain in joints, high doses     Current Outpatient Medications  Medication Sig Dispense Refill  . amLODipine (NORVASC) 10 MG tablet Take 0.5 tablets (5 mg total) by mouth daily. 45 tablet 1  . Ascorbic Acid (VITAMIN C) 1000 MG tablet Take 1,000 mg by mouth daily.      Marland Kitchen. atorvastatin (LIPITOR) 40 MG tablet TAKE 1 TABLET BY MOUTH DAILY 90 tablet 1  . BREO ELLIPTA 200-25 MCG/INH AEPB INHALE ONE PUFF INTO THE LUNGS DAILY 60 each 0  . Cholecalciferol (VITAMIN D) 2000 UNITS CAPS Take 2,000  Units by mouth daily.     Marland Kitchen. co-enzyme Q-10 30 MG capsule Take 30 mg by mouth daily.    . diazepam (VALIUM) 10 MG tablet Take 10 mg by mouth every 8 (eight) hours as needed. For anxiety    . Flaxseed, Linseed, (FLAX SEED OIL) 1000 MG CAPS Take 4,000 mg by mouth daily.    . hydrochlorothiazide (HYDRODIURIL) 25 MG tablet TAKE 1 TABLET BY MOUTH DAILY 90 tablet 1  . HYDROcodone-acetaminophen (NORCO) 10-325 MG per tablet Take 1 tablet by mouth every 6 (six) hours as needed for moderate pain. For pain    . HYDROmorphone (DILAUDID) 4 MG tablet Take 4 mg by mouth 3 (three) times daily as needed.     . Menthol, Topical Analgesic, (BLUE-EMU MAXIMUM STRENGTH) 2.5 % LIQD Apply 1 application topically 3 (three) times daily as needed (for muscle pain).    . Multiple Vitamin (MULTIVITAMIN) tablet Take 1 tablet by mouth daily.      Marland Kitchen. omeprazole (PRILOSEC) 20 MG capsule Take 20 mg by mouth 2 (two) times daily as needed (for heartburn or acid reflux).     Marland Kitchen. PARoxetine (PAXIL) 20 MG tablet Take 20 mg  by mouth daily.     . VENTOLIN HFA 108 (90 Base) MCG/ACT inhaler INHALE TWO PUFFS BY MOUTH EVERY 6 HOURS AS NEEDED SHORTNESS OF BREATH 18 g 2  . vitamin B-12 (CYANOCOBALAMIN) 1000 MCG tablet Take 1,000 mcg by mouth daily.    . vitamin E 1000 UNIT capsule Take 1,000 Units by mouth daily.     No current facility-administered medications for this visit.      Past Surgical History:  Procedure Laterality Date  . BACK SURGERY  08/2009, 09/2011  . CARDIAC CATHETERIZATION  2001   no significant CAD  . CARPAL TUNNEL RELEASE  1979  . cervical neck surgeries    . INSERT / REPLACE / REMOVE PACEMAKER  08/2011   initially inserted 12/2002  . pacemaker generator change  08/16/11   Medtronic Adapta L implanted by Dr Johney Frame, lead implangted 2004 in [place]  . PACEMAKER GENERATOR CHANGE Left 08/16/2011   Procedure: PACEMAKER GENERATOR CHANGE;  Surgeon: Hillis Range, MD;  Location: [facility] CATH LAB;  Service: Cardiovascular;  Laterality:  Left;  . PACEMAKER INSERTION     Implanted 2004 in [place]  . SHOULDER ARTHROSCOPY DISTAL CLAVICLE EXCISION AND OPEN ROTATOR CUFF REPAIR  2009   right  . SHOULDER ARTHROSCOPY DISTAL CLAVICLE EXCISION AND OPEN ROTATOR CUFF REPAIR  2010   Left  . ULNAR TUNNEL RELEASE  1980's(late) or early 1990's     Allergies  Allergen Reactions  . Other Other (See Comments)    Nuclear Stress Medicine- made him feel" out of touch" w/ reality.  . Adhesive [Tape] Other (See Comments)    Pulls skin off, Please use "paper" tape  . Codeine Itching and Rash  . Prednisone Other (See Comments)      pain in joints, high doses      Family History  Problem Relation Age of Onset  . Diabetes Neg Hx   . Hypertension Neg Hx   . Coronary artery disease Neg Hx      Social History Mr. Behn reports that he quit smoking about 40 years ago. His smoking use included cigarettes. He started smoking about 50 years ago. He has a 6.00 pack-year smoking history. He has never used smokeless tobacco. Mr. Eisel reports no history of alcohol use.   Review of Systems CONSTITUTIONAL: No weight loss, fever, chills, weakness or fatigue.  HEENT: Eyes: No visual loss, blurred vision, double vision or yellow sclerae.No hearing loss, sneezing, congestion, runny nose or sore throat.  SKIN: No rash or itching.  CARDIOVASCULAR: per hpi RESPIRATORY: No shortness of breath, cough or sputum.  GASTROINTESTINAL: No anorexia, nausea, vomiting or diarrhea. No abdominal pain or blood.  GENITOURINARY: No burning on urination, no polyuria NEUROLOGICAL: No headache, dizziness, syncope, paralysis, ataxia, numbness or tingling in the extremities. No change in bowel or bladder control.  MUSCULOSKELETAL: No muscle, back pain, joint pain or stiffness.  LYMPHATICS: No enlarged nodes. No history of splenectomy.  PSYCHIATRIC: No history of depression or anxiety.  ENDOCRINOLOGIC: No reports of sweating, cold or heat intolerance. No polyuria  or polydipsia.  Marland Kitchen   Physical Examination Vitals:   08/07/18 1446  BP: (!) 146/72  Pulse: 60  SpO2: 95%   Vitals:   08/07/18 1446  Weight: 177 lb 9.6 oz (80.6 kg)  Height: 5\' 5"  (1.651 m)    Gen: resting comfortably, no acute distress HEENT: no scleral icterus, pupils equal round and reactive, no palptable cervical adenopathy,  CV: RRR, no m/r/g,no jvd Resp: Clear to auscultation bilaterally GI:  abdomen is soft, non-tender, non-distended, normal bowel sounds, no hepatosplenomegaly MSK: extremities are warm, no edema.  Skin: warm, no rash Neuro:  no focal deficits Psych: appropriate affect    Assessment and Plan  1. Complete heart block  - normal device check recently - no symptoms - continue to monitor at this time - EKG today shows AV pacing.   2. HTN  - home bp's at goal, continue current meds  3. Hyperlipidemia -request labs from pcp, continue statin   F/u 1 year    Antoine PocheJonathan F. Ravin Denardo, M.D.

## 2018-08-10 ENCOUNTER — Encounter: Payer: Self-pay | Admitting: Cardiology

## 2018-08-28 ENCOUNTER — Other Ambulatory Visit: Payer: Self-pay | Admitting: Pulmonary Disease

## 2018-08-28 ENCOUNTER — Other Ambulatory Visit: Payer: Self-pay | Admitting: Cardiology

## 2018-08-31 ENCOUNTER — Ambulatory Visit (INDEPENDENT_AMBULATORY_CARE_PROVIDER_SITE_OTHER): Payer: Medicare HMO | Admitting: *Deleted

## 2018-08-31 DIAGNOSIS — I442 Atrioventricular block, complete: Secondary | ICD-10-CM | POA: Diagnosis not present

## 2018-09-03 LAB — CUP PACEART REMOTE DEVICE CHECK
Battery Impedance: 785 Ohm
Battery Remaining Longevity: 60 mo
Battery Voltage: 2.79 V
Brady Statistic AP VP Percent: 23 %
Implantable Lead Implant Date: 20040709
Implantable Lead Location: 753860
Implantable Lead Model: 5076
Implantable Pulse Generator Implant Date: 20130215
Lead Channel Impedance Value: 399 Ohm
Lead Channel Impedance Value: 544 Ohm
Lead Channel Setting Pacing Amplitude: 2 V
Lead Channel Setting Pacing Amplitude: 3 V
Lead Channel Setting Pacing Pulse Width: 0.4 ms
Lead Channel Setting Sensing Sensitivity: 5.6 mV
MDC IDC LEAD IMPLANT DT: 20040709
MDC IDC LEAD LOCATION: 753859
MDC IDC MSMT LEADCHNL RA PACING THRESHOLD AMPLITUDE: 0.625 V
MDC IDC MSMT LEADCHNL RA PACING THRESHOLD PULSEWIDTH: 0.4 ms
MDC IDC MSMT LEADCHNL RV PACING THRESHOLD AMPLITUDE: 1.5 V
MDC IDC MSMT LEADCHNL RV PACING THRESHOLD PULSEWIDTH: 0.4 ms
MDC IDC SESS DTM: 20200301202614
MDC IDC STAT BRADY AP VS PERCENT: 0 %
MDC IDC STAT BRADY AS VP PERCENT: 77 %
MDC IDC STAT BRADY AS VS PERCENT: 0 %

## 2018-09-07 NOTE — Progress Notes (Signed)
Remote pacemaker transmission.   

## 2018-09-10 ENCOUNTER — Telehealth: Payer: Self-pay

## 2018-09-10 MED ORDER — FLUTICASONE FUROATE-VILANTEROL 200-25 MCG/INH IN AEPB: 1.0000 | INHALATION_SPRAY | Freq: Every day | RESPIRATORY_TRACT | 3 refills | Status: DC

## 2018-09-10 NOTE — Telephone Encounter (Signed)
Received call from patient, requesting refill of Breo and to make an appt. Appt made. Refill sent. Nothing further is needed at this time.

## 2018-09-27 ENCOUNTER — Other Ambulatory Visit: Payer: Self-pay | Admitting: Cardiology

## 2018-10-06 ENCOUNTER — Telehealth: Payer: Self-pay | Admitting: Pulmonary Disease

## 2018-10-06 NOTE — Telephone Encounter (Signed)
Spoke with pt, he would like to be qualified for o2 because he thinks he needs it. He states he has a 50% lung function and [employer] said they would cover an oxygen tank. He has never been on oxygen and doesn't have the equipment to check his pulse ox to determine if his saturations have dropped. Does he need to do a televisit, he hasn't been seen here since 10/2017? Then we could have him come in to do a qualifying walk. Arlys John please advise next step.    Patient Instructions by Coral Ceo, NP at 11/17/2017 3:45 PM  Author: Coral Ceo, NP Author Type: Nurse Practitioner Filed: 11/17/2017 4:46 PM  Note Status: Addendum Sebastian Ache: Cosign Not Required Encounter Date: 11/17/2017  Editor: Coral Ceo, NP (Nurse Practitioner)  Prior Versions: 1. Coral Ceo, NP (Nurse Practitioner) at 11/17/2017 4:45 PM - Addendum   2. Coral Ceo, NP (Nurse Practitioner) at 11/17/2017 4:42 PM - Signed    Follow-up with Dr. Kendrick Fries in November/2019 >>>Repeat CT without contrast for lung nodules Follow up with any concerns or issues with your breathing Continue with Breo Ellipta inhaler   Please contact the office if your symptoms worsen or you have concerns that you are not improving.   Thank you for choosing [facility] for your healthcare, and for allowing [place] to partner with you on your healthcare journey. I am thankful to be able to provide care to you today.   Elisha Headland FNP-C

## 2018-10-07 NOTE — Telephone Encounter (Signed)
The problem is if he is not on oxygen then will need a walk to qualify him , this has to be done in office . Why does he thinks he needs oxygen now.   There is no mention in last notes about oxygen , O2 sats were 97% in office, unless he is under 88% will not qualify

## 2018-10-07 NOTE — Telephone Encounter (Signed)
Message routed to APP of morning:   Rubye Oaks, NP: Can you please take a look at the information below (at [facility] request) and advise? Thank you much.

## 2018-10-07 NOTE — Telephone Encounter (Signed)
I am in the hospital today please route to APP of the day. Or ill address tomm.   Jason Yu

## 2018-10-07 NOTE — Telephone Encounter (Signed)
Called and spoke with pt to see if he is wearing O2 and pt stated he has purchased 250 1second puff of oxygen (can is about 18 inches tall and has a nozzle on it that he puts in his mouth to be able to get the puff of O2). With this, there is no certain liter flow that pt is on as he purchased this from [employer].  Stated to pt that we would need to have him come in to the office for a qualifying walk so we could see if he would even qualify for a POC. Pt expressed understanding. Pt scheduled for face-to-face OV with Elisha Headland, NP tomorrow 4/9 so he can be qualified for O2 and covid screening questions were also asked. Pt also provided with new office address. Nothing further needed.

## 2018-10-07 NOTE — Progress Notes (Deleted)
@Patient  ID: Jason Yu, male    DOB: 12/03/1950, 68 y.o.   MRN: [id number]  No chief complaint on file.   Referring provider: Gwenlyn Fudge, FNP  HPI:   PMH:  Smoker/ Smoking History:  Maintenance:   Pt of:   Recent [facility] Encounters:     10/07/2018  - Visit   HPI  Tests:   Chest imaging: April 2018 CT chest images independently reviewed showing a 2.3 mm nodule in the right upper lobe and an 8 mm nodule in the left upper lobe, I question whether or not this is a pleural-based lymph node. >>>will repeat November 2019 PFT: December 2018 spirometry test ratio 65%, FEV1 1.46 L 51% predicted, FVC 2.26 L 59% predicted  FENO:  No results found for: NITRICOXIDE  PFT: No flowsheet data found.  Imaging: No results found.    Specialty Problems      Pulmonary Problems   COPD with asthma (HCC)   Lung nodules   Allergic rhinitis      Allergies  Allergen Reactions  . Other Other (See Comments)    Nuclear Stress Medicine- made him feel" out of touch" w/ reality.  . Adhesive [Tape] Other (See Comments)    Pulls skin off, Please use "paper" tape  . Codeine Itching and Rash  . Prednisone Other (See Comments)      pain in joints, high doses    Immunization History  Administered Date(s) Administered  . Influenza Split 04/17/2017  . Influenza,inj,Quad PF,6+ Mos 05/17/2014    Past Medical History:  Diagnosis Date  . Anxiety   . Asthma    hasn't used inhaler at least 4-5 months.  . Complete heart block [facility])    with prior syncope s/p PPM 2004 in [place] (MDT Kappa);  08/16/2011 PPM Gen. Change: MDT Adapta L ADDDR1, [id number] H.  . Complication of anesthesia 05/2011   had difficulty breathing after surgery=C8 nerve root compression  . DDD (degenerative disc disease)    with multiple prior back surgeries   . GERD (gastroesophageal reflux disease)   . Hepatitis    Hx Hepatitis A & B  . HLD (hyperlipidemia)   . HTN (hypertension), benign   .  Pacemaker   . Pneumonia last 1998   has had 8x  . PONV (postoperative nausea and vomiting)   . Shortness of breath   . Syncope    a.  2001 - normal cath;  b. 2012 Normal Myoview, EF 58%.    Tobacco History: Social History   Tobacco Use  Smoking Status Former Smoker  . Packs/day: 2.00  . Years: 3.00  . Pack years: 6.00  . Types: Cigarettes  . Start date: 07/11/1968  . Last attempt to quit: 11/29/1977  . Years since quitting: 40.8  Smokeless Tobacco Never Used   Counseling given: Not Answered   Outpatient Encounter Medications as of 10/08/2018  Medication Sig  . amLODipine (NORVASC) 10 MG tablet TAKE 1/2 TABLET BY MOUTH DAILY  . Ascorbic Acid (VITAMIN C) 1000 MG tablet Take 1,000 mg by mouth daily.    Marland Kitchen atorvastatin (LIPITOR) 40 MG tablet TAKE 1 TABLET BY MOUTH DAILY  . Cholecalciferol (VITAMIN D) 2000 UNITS CAPS Take 2,000 Units by mouth daily.   Marland Kitchen co-enzyme Q-10 30 MG capsule Take 30 mg by mouth daily.  . diazepam (VALIUM) 10 MG tablet Take 10 mg by mouth every 8 (eight) hours as needed. For anxiety  . Flaxseed, Linseed, (FLAX SEED OIL) 1000 MG CAPS Take  4,000 mg by mouth daily.  . fluticasone furoate-vilanterol (BREO ELLIPTA) 200-25 MCG/INH AEPB Inhale 1 puff into the lungs daily.  . hydrochlorothiazide (HYDRODIURIL) 25 MG tablet TAKE 1 TABLET BY MOUTH DAILY  . HYDROcodone-acetaminophen (NORCO) 10-325 MG per tablet Take 1 tablet by mouth every 6 (six) hours as needed for moderate pain. For pain  . HYDROmorphone (DILAUDID) 4 MG tablet Take 4 mg by mouth 3 (three) times daily as needed.   . Menthol, Topical Analgesic, (BLUE-EMU MAXIMUM STRENGTH) 2.5 % LIQD Apply 1 application topically 3 (three) times daily as needed (for muscle pain).  . Multiple Vitamin (MULTIVITAMIN) tablet Take 1 tablet by mouth daily.    Marland Kitchen. omeprazole (PRILOSEC) 20 MG capsule Take 20 mg by mouth 2 (two) times daily as needed (for heartburn or acid reflux).   Marland Kitchen. PARoxetine (PAXIL) 20 MG tablet Take 20 mg by  mouth daily.   . VENTOLIN HFA 108 (90 Base) MCG/ACT inhaler INHALE TWO PUFFS BY MOUTH EVERY 6 HOURS AS NEEDED SHORTNESS OF BREATH  . vitamin B-12 (CYANOCOBALAMIN) 1000 MCG tablet Take 1,000 mcg by mouth daily.  . vitamin E 1000 UNIT capsule Take 1,000 Units by mouth daily.   No facility-administered encounter medications on file as of 10/08/2018.      Review of Systems  Review of Systems   Physical Exam  There were no vitals taken for this visit.  Wt Readings from Last 5 Encounters:  08/07/18 177 lb 9.6 oz (80.6 kg)  05/01/18 173 lb (78.5 kg)  11/17/17 169 lb 12.8 oz (77 kg)  06/17/17 167 lb (75.8 kg)  06/05/17 165 lb (74.8 kg)     Physical Exam    Lab Results:  CBC    Component Value Date/Time   WBC 6.4 02/21/2012 1227   RBC 4.82 02/21/2012 1227   HGB 14.8 02/21/2012 1227   HCT 42.9 02/21/2012 1227   PLT 231 02/21/2012 1227   MCV 89.0 02/21/2012 1227   MCH 30.7 02/21/2012 1227   MCHC 34.5 02/21/2012 1227   RDW 13.3 02/21/2012 1227   LYMPHSABS 1.2 07/19/2009 2100   MONOABS 0.6 07/19/2009 2100   EOSABS 0.0 07/19/2009 2100   BASOSABS 0.0 07/19/2009 2100    BMET    Component Value Date/Time   NA 137 02/21/2012 1227   K 4.6 02/21/2012 1227   CL 102 02/21/2012 1227   CO2 25 02/21/2012 1227   GLUCOSE 110 (H) 02/21/2012 1227   BUN 13 02/21/2012 1227   CREATININE 0.75 02/21/2012 1227   CALCIUM 9.7 02/21/2012 1227   GFRNONAA >90 02/21/2012 1227   GFRAA >90 02/21/2012 1227    BNP No results found for: BNP  ProBNP No results found for: PROBNP    Assessment & Plan:     No problem-specific Assessment & Plan notes found for this encounter.     Coral CeoBrian P Rajesh Wyss, NP 10/07/2018   This appointment was *** with over 50% of the time in direct face-to-face patient care, assessment, plan of care, and follow-up.

## 2018-10-08 ENCOUNTER — Ambulatory Visit: Payer: Medicare HMO | Admitting: Pulmonary Disease

## 2018-10-21 ENCOUNTER — Other Ambulatory Visit: Payer: Self-pay | Admitting: Pulmonary Disease

## 2018-10-27 ENCOUNTER — Ambulatory Visit: Payer: Medicare HMO | Admitting: Pulmonary Disease

## 2018-10-30 NOTE — Progress Notes (Deleted)
 @Patient  ID: Jason Yu, male    DOB: 10/20/1950, 68 y.o.   MRN: [id number]  No chief complaint on file.   Referring provider: Gwenlyn FudgeJoyce, Britney F, FNP  HPI:   PMH:  Smoker/ Smoking History:  Maintenance:   Pt of:   Recent [facility] Encounters:     10/30/2018  - Visit   HPI  Tests:   Chest imaging: April 2018 CT chest images independently reviewed showing a 2.3 mm nodule in the right upper lobe and an 8 mm nodule in the left upper lobe, I question whether or not this is a pleural-based lymph node. >>>will repeat November 2019 PFT: December 2018 spirometry test ratio 65%, FEV1 1.46 L 51% predicted, FVC 2.26 L 59% predicted  FENO:  No results found for: NITRICOXIDE  PFT: No flowsheet data found.  Imaging: No results found.  FENO:  No results found for: NITRICOXIDE  PFT: No flowsheet data found.  Imaging: No results found.    Specialty Problems      Pulmonary Problems   COPD with asthma (HCC)   Lung nodules   Allergic rhinitis      Allergies  Allergen Reactions   Other Other (See Comments)    Nuclear Stress Medicine- made him feel" out of touch" w/ reality.   Adhesive [Tape] Other (See Comments)    Pulls skin off, Please use "paper" tape   Codeine Itching and Rash   Prednisone Other (See Comments)      pain in joints, high doses    Immunization History  Administered Date(s) Administered   Influenza Split 04/17/2017   Influenza,inj,Quad PF,6+ Mos 05/17/2014    Past Medical History:  Diagnosis Date   Anxiety    Asthma    hasn't used inhaler at least 4-5 months.   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 Change: MDT Adapta L ADDDR1, [id number] H.   Complication of anesthesia 05/2011   had difficulty breathing after surgery=C8 nerve root compression   DDD (degenerative disc disease)    with multiple prior back surgeries    GERD (gastroesophageal reflux  disease)    Hepatitis    Hx Hepatitis A & B   HLD (hyperlipidemia)    HTN (hypertension), benign    Pacemaker    Pneumonia last 1998   has had 8x   PONV (postoperative nausea and vomiting)    Shortness of breath    Syncope    a.  2001 - normal cath;  b. 2012 Normal Myoview, EF 58%.    Tobacco History: Social History   Tobacco Use  Smoking Status Former Smoker   Packs/day: 2.00   Years: 3.00   Pack years: 6.00   Types: Cigarettes   Start date: 07/11/1968   Last attempt to quit: 11/29/1977   Years since quitting: 40.9  Smokeless Tobacco Never Used   Counseling given: Not Answered   Outpatient Encounter Medications as of 11/02/2018  Medication Sig   amLODipine (NORVASC) 10 MG tablet TAKE 1/2 TABLET BY MOUTH DAILY   Ascorbic Acid (VITAMIN C) 1000 MG tablet Take 1,000 mg by mouth daily.     atorvastatin (LIPITOR) 40 MG tablet TAKE 1 TABLET BY MOUTH DAILY   Cholecalciferol (VITAMIN D) 2000 UNITS CAPS Take 2,000 Units by mouth daily.    co-enzyme Q-10 30 MG capsule Take 30 mg by mouth daily.   diazepam (VALIUM) 10 MG tablet Take 10 mg by mouth  every 8 (eight) hours as needed. For anxiety   Flaxseed, Linseed, (FLAX SEED OIL) 1000 MG CAPS Take 4,000 mg by mouth daily.   fluticasone furoate-vilanterol (BREO ELLIPTA) 200-25 MCG/INH AEPB Inhale 1 puff into the lungs daily.   hydrochlorothiazide (HYDRODIURIL) 25 MG tablet TAKE 1 TABLET BY MOUTH DAILY   HYDROcodone-acetaminophen (NORCO) 10-325 MG per tablet Take 1 tablet by mouth every 6 (six) hours as needed for moderate pain. For pain   HYDROmorphone (DILAUDID) 4 MG tablet Take 4 mg by mouth 3 (three) times daily as needed.    Menthol, Topical Analgesic, (BLUE-EMU MAXIMUM STRENGTH) 2.5 % LIQD Apply 1 application topically 3 (three) times daily as needed (for muscle pain).   Multiple Vitamin (MULTIVITAMIN) tablet Take 1 tablet by mouth daily.     omeprazole (PRILOSEC) 20 MG capsule Take 20 mg by mouth 2 (two)  times daily as needed (for heartburn or acid reflux).    PARoxetine (PAXIL) 20 MG tablet Take 20 mg by mouth daily.    VENTOLIN HFA 108 (90 Base) MCG/ACT inhaler INHALE TWO PUFFS BY MOUTH EVERY 6 HOURS AS NEEDED SHORTNESS OF BREATH   vitamin B-12 (CYANOCOBALAMIN) 1000 MCG tablet Take 1,000 mcg by mouth daily.   vitamin E 1000 UNIT capsule Take 1,000 Units by mouth daily.   No facility-administered encounter medications on file as of 11/02/2018.      Review of Systems  Review of Systems   Physical Exam  There were no vitals taken for this visit.  Wt Readings from Last 5 Encounters:  08/07/18 177 lb 9.6 oz (80.6 kg)  05/01/18 173 lb (78.5 kg)  11/17/17 169 lb 12.8 oz (77 kg)  06/17/17 167 lb (75.8 kg)  06/05/17 165 lb (74.8 kg)     Physical Exam    Lab Results:  CBC    Component Value Date/Time   WBC 6.4 02/21/2012 1227   RBC 4.82 02/21/2012 1227   HGB 14.8 02/21/2012 1227   HCT 42.9 02/21/2012 1227   PLT 231 02/21/2012 1227   MCV 89.0 02/21/2012 1227   MCH 30.7 02/21/2012 1227   MCHC 34.5 02/21/2012 1227   RDW 13.3 02/21/2012 1227   LYMPHSABS 1.2 07/19/2009 2100   MONOABS 0.6 07/19/2009 2100   EOSABS 0.0 07/19/2009 2100   BASOSABS 0.0 07/19/2009 2100    BMET    Component Value Date/Time   NA 137 02/21/2012 1227   K 4.6 02/21/2012 1227   CL 102 02/21/2012 1227   CO2 25 02/21/2012 1227   GLUCOSE 110 (H) 02/21/2012 1227   BUN 13 02/21/2012 1227   CREATININE 0.75 02/21/2012 1227   CALCIUM 9.7 02/21/2012 1227   GFRNONAA >90 02/21/2012 1227   GFRAA >90 02/21/2012 1227    BNP No results found for: BNP  ProBNP No results found for: PROBNP    Assessment & Plan:     No problem-specific Assessment & Plan notes found for this encounter.     Coral Ceo, NP 10/30/2018   This appointment was *** with over 50% of the time in direct face-to-face patient care, assessment, plan of care, and follow-up.

## 2018-11-02 ENCOUNTER — Ambulatory Visit: Payer: Medicare HMO | Admitting: Pulmonary Disease

## 2018-11-09 ENCOUNTER — Telehealth: Payer: Self-pay | Admitting: Pulmonary Disease

## 2018-11-09 NOTE — Telephone Encounter (Signed)
Returned call to and explained that 02 qualification cannot be done over the phone. Nothing further needed.

## 2018-11-12 ENCOUNTER — Ambulatory Visit: Payer: Medicare HMO | Admitting: Pulmonary Disease

## 2018-11-21 ENCOUNTER — Other Ambulatory Visit: Payer: Self-pay | Admitting: Pulmonary Disease

## 2018-11-26 ENCOUNTER — Ambulatory Visit (INDEPENDENT_AMBULATORY_CARE_PROVIDER_SITE_OTHER): Payer: Medicare HMO | Admitting: Otolaryngology

## 2018-11-26 ENCOUNTER — Other Ambulatory Visit: Payer: Self-pay

## 2018-11-26 DIAGNOSIS — S02831A Fracture of medial orbital wall, right side, initial encounter for closed fracture: Secondary | ICD-10-CM

## 2018-11-26 DIAGNOSIS — H6121 Impacted cerumen, right ear: Secondary | ICD-10-CM | POA: Diagnosis not present

## 2018-11-26 DIAGNOSIS — R42 Dizziness and giddiness: Secondary | ICD-10-CM | POA: Diagnosis not present

## 2018-11-26 DIAGNOSIS — H903 Sensorineural hearing loss, bilateral: Secondary | ICD-10-CM | POA: Diagnosis not present

## 2018-11-29 NOTE — Progress Notes (Deleted)
@Patient  ID: Jason Yu, male    DOB: 1950-07-06, 68 y.o.   MRN: [id number]  No chief complaint on file.   Referring provider: Gwenlyn Fudge, FNP  HPI:   PMH:  Smoker/ Smoking History:  Maintenance:   Pt of: Dr. Kendrick Fries  11/29/2018  - Visit   HPI  Tests:   Chest imaging: April 2018 CT chest images independently reviewed showing a 2.3 mm nodule in the right upper lobe and an 8 mm nodule in the left upper lobe, I question whether or not this is a pleural-based lymph node. >>>will repeat November 2019 PFT: December 2018 spirometry test ratio 65%, FEV1 1.46 L 51% predicted, FVC 2.26 L 59% predicted   FENO:  No results found for: NITRICOXIDE  PFT: No flowsheet data found.  Imaging: No results found.    Specialty Problems      Pulmonary Problems   COPD with asthma (HCC)   Lung nodules   Allergic rhinitis      Allergies  Allergen Reactions  . Other Other (See Comments)    Nuclear Stress Medicine- made him feel" out of touch" w/ reality.  . Adhesive [Tape] Other (See Comments)    Pulls skin off, Please use "paper" tape  . Codeine Itching and Rash  . Prednisone Other (See Comments)      pain in joints, high doses    Immunization History  Administered Date(s) Administered  . Influenza Split 04/17/2017  . Influenza,inj,Quad PF,6+ Mos 05/17/2014    Past Medical History:  Diagnosis Date  . Anxiety   . Asthma    hasn't used inhaler at least 4-5 months.  . Complete heart block [facility])    with prior syncope s/p PPM 2004 in [place] (MDT Kappa);  08/16/2011 PPM Gen. Change: MDT Adapta L ADDDR1, [id number] H.  . Complication of anesthesia 05/2011   had difficulty breathing after surgery=C8 nerve root compression  . DDD (degenerative disc disease)    with multiple prior back surgeries   . GERD (gastroesophageal reflux disease)   . Hepatitis    Hx Hepatitis A & B  . HLD (hyperlipidemia)   . HTN (hypertension), benign   . Pacemaker   . Pneumonia last  1998   has had 8x  . PONV (postoperative nausea and vomiting)   . Shortness of breath   . Syncope    a.  2001 - normal cath;  b. 2012 Normal Myoview, EF 58%.    Tobacco History: Social History   Tobacco Use  Smoking Status Former Smoker  . Packs/day: 2.00  . Years: 3.00  . Pack years: 6.00  . Types: Cigarettes  . Start date: 07/11/1968  . Last attempt to quit: 11/29/1977  . Years since quitting: 41.0  Smokeless Tobacco Never Used   Counseling given: Not Answered   Continue to not smoke  Outpatient Encounter Medications as of 11/30/2018  Medication Sig  . amLODipine (NORVASC) 10 MG tablet TAKE 1/2 TABLET BY MOUTH DAILY  . Ascorbic Acid (VITAMIN C) 1000 MG tablet Take 1,000 mg by mouth daily.    Marland Kitchen atorvastatin (LIPITOR) 40 MG tablet TAKE 1 TABLET BY MOUTH DAILY  . Cholecalciferol (VITAMIN D) 2000 UNITS CAPS Take 2,000 Units by mouth daily.   Marland Kitchen co-enzyme Q-10 30 MG capsule Take 30 mg by mouth daily.  . diazepam (VALIUM) 10 MG tablet Take 10 mg by mouth every 8 (eight) hours as needed. For anxiety  . Flaxseed, Linseed, (FLAX SEED OIL) 1000 MG CAPS Take 4,000  mg by mouth daily.  . fluticasone furoate-vilanterol (BREO ELLIPTA) 200-25 MCG/INH AEPB Inhale 1 puff into the lungs daily.  . hydrochlorothiazide (HYDRODIURIL) 25 MG tablet TAKE 1 TABLET BY MOUTH DAILY  . HYDROcodone-acetaminophen (NORCO) 10-325 MG per tablet Take 1 tablet by mouth every 6 (six) hours as needed for moderate pain. For pain  . HYDROmorphone (DILAUDID) 4 MG tablet Take 4 mg by mouth 3 (three) times daily as needed.   . Menthol, Topical Analgesic, (BLUE-EMU MAXIMUM STRENGTH) 2.5 % LIQD Apply 1 application topically 3 (three) times daily as needed (for muscle pain).  . Multiple Vitamin (MULTIVITAMIN) tablet Take 1 tablet by mouth daily.    Marland Kitchen. omeprazole (PRILOSEC) 20 MG capsule Take 20 mg by mouth 2 (two) times daily as needed (for heartburn or acid reflux).   Marland Kitchen. PARoxetine (PAXIL) 20 MG tablet Take 20 mg by mouth  daily.   . VENTOLIN HFA 108 (90 Base) MCG/ACT inhaler INHALE TWO PUFFS BY MOUTH EVERY 6 HOURS AS NEEDED SHORTNESS OF BREATH  . vitamin B-12 (CYANOCOBALAMIN) 1000 MCG tablet Take 1,000 mcg by mouth daily.  . vitamin E 1000 UNIT capsule Take 1,000 Units by mouth daily.   No facility-administered encounter medications on file as of 11/30/2018.      Review of Systems  Review of Systems   Physical Exam  There were no vitals taken for this visit.  Wt Readings from Last 5 Encounters:  08/07/18 177 lb 9.6 oz (80.6 kg)  05/01/18 173 lb (78.5 kg)  11/17/17 169 lb 12.8 oz (77 kg)  06/17/17 167 lb (75.8 kg)  06/05/17 165 lb (74.8 kg)     Physical Exam    Lab Results:  CBC    Component Value Date/Time   WBC 6.4 02/21/2012 1227   RBC 4.82 02/21/2012 1227   HGB 14.8 02/21/2012 1227   HCT 42.9 02/21/2012 1227   PLT 231 02/21/2012 1227   MCV 89.0 02/21/2012 1227   MCH 30.7 02/21/2012 1227   MCHC 34.5 02/21/2012 1227   RDW 13.3 02/21/2012 1227   LYMPHSABS 1.2 07/19/2009 2100   MONOABS 0.6 07/19/2009 2100   EOSABS 0.0 07/19/2009 2100   BASOSABS 0.0 07/19/2009 2100    BMET    Component Value Date/Time   NA 137 02/21/2012 1227   K 4.6 02/21/2012 1227   CL 102 02/21/2012 1227   CO2 25 02/21/2012 1227   GLUCOSE 110 (H) 02/21/2012 1227   BUN 13 02/21/2012 1227   CREATININE 0.75 02/21/2012 1227   CALCIUM 9.7 02/21/2012 1227   GFRNONAA >90 02/21/2012 1227   GFRAA >90 02/21/2012 1227    BNP No results found for: BNP  ProBNP No results found for: PROBNP    Assessment & Plan:   No problem-specific Assessment & Plan notes found for this encounter.    No follow-ups on file.   Coral CeoBrian P , NP 11/29/2018   This appointment was *** minutes long with over 50% of the time in direct face-to-face patient care, assessment, plan of care, and follow-up.

## 2018-11-30 ENCOUNTER — Encounter: Payer: Medicare HMO | Admitting: *Deleted

## 2018-11-30 ENCOUNTER — Ambulatory Visit: Payer: Medicare HMO | Admitting: Pulmonary Disease

## 2018-12-01 ENCOUNTER — Telehealth: Payer: Self-pay

## 2018-12-01 NOTE — Telephone Encounter (Signed)
Left message for patient to remind of missed remote transmission.  

## 2018-12-08 ENCOUNTER — Encounter: Payer: Self-pay | Admitting: Cardiology

## 2018-12-18 ENCOUNTER — Telehealth: Payer: Self-pay | Admitting: Internal Medicine

## 2018-12-18 ENCOUNTER — Ambulatory Visit (INDEPENDENT_AMBULATORY_CARE_PROVIDER_SITE_OTHER): Payer: Medicare HMO | Admitting: *Deleted

## 2018-12-18 DIAGNOSIS — I442 Atrioventricular block, complete: Secondary | ICD-10-CM

## 2018-12-18 LAB — CUP PACEART REMOTE DEVICE CHECK
Battery Impedance: 889 Ohm
Battery Remaining Longevity: 56 mo
Battery Voltage: 2.78 V
Brady Statistic AP VP Percent: 20 %
Brady Statistic AP VS Percent: 0 %
Brady Statistic AS VP Percent: 80 %
Brady Statistic AS VS Percent: 0 %
Date Time Interrogation Session: 20200619135432
Implantable Lead Implant Date: 20040709
Implantable Lead Implant Date: 20040709
Implantable Lead Location: 753859
Implantable Lead Location: 753860
Implantable Lead Model: 5076
Implantable Lead Model: 5076
Implantable Pulse Generator Implant Date: 20130215
Lead Channel Impedance Value: 409 Ohm
Lead Channel Impedance Value: 565 Ohm
Lead Channel Pacing Threshold Amplitude: 0.625 V
Lead Channel Pacing Threshold Amplitude: 1.5 V
Lead Channel Pacing Threshold Pulse Width: 0.4 ms
Lead Channel Pacing Threshold Pulse Width: 0.4 ms
Lead Channel Setting Pacing Amplitude: 2 V
Lead Channel Setting Pacing Amplitude: 3 V
Lead Channel Setting Pacing Pulse Width: 0.4 ms
Lead Channel Setting Sensing Sensitivity: 5.6 mV

## 2018-12-18 NOTE — Telephone Encounter (Signed)
Transmission received. Awaiting review.
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
## 2018-12-23 NOTE — Progress Notes (Signed)
Remote pacemaker transmission.   

## 2018-12-25 ENCOUNTER — Ambulatory Visit: Payer: Medicare HMO | Admitting: Pulmonary Disease

## 2018-12-25 ENCOUNTER — Other Ambulatory Visit: Payer: Self-pay | Admitting: Pulmonary Disease

## 2018-12-31 ENCOUNTER — Ambulatory Visit: Payer: Medicare HMO | Admitting: Pulmonary Disease

## 2019-01-25 ENCOUNTER — Other Ambulatory Visit: Payer: Self-pay | Admitting: Pulmonary Disease

## 2019-02-05 ENCOUNTER — Telehealth: Payer: Self-pay | Admitting: Pulmonary Disease

## 2019-02-05 ENCOUNTER — Other Ambulatory Visit: Payer: Self-pay | Admitting: Pulmonary Disease

## 2019-02-05 MED ORDER — BREO ELLIPTA 200-25 MCG/INH IN AEPB: 1.0000 | INHALATION_SPRAY | Freq: Every day | RESPIRATORY_TRACT | 0 refills | Status: DC

## 2019-02-05 NOTE — Telephone Encounter (Signed)
Spoke with pt. He is needing refills on Breo. Advised him that we have not seen him in over 1 year. Pt has been scheduled for a televisit with Aaron Edelman on 02/08/2019 at 1100. Refill has been sent in for the pt. Nothing further was needed.
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
## 2019-02-08 ENCOUNTER — Ambulatory Visit (INDEPENDENT_AMBULATORY_CARE_PROVIDER_SITE_OTHER): Payer: Medicare HMO | Admitting: Pulmonary Disease

## 2019-02-08 ENCOUNTER — Ambulatory Visit (INDEPENDENT_AMBULATORY_CARE_PROVIDER_SITE_OTHER): Payer: Medicare HMO | Admitting: Otolaryngology

## 2019-02-08 ENCOUNTER — Encounter: Payer: Self-pay | Admitting: Pulmonary Disease

## 2019-02-08 ENCOUNTER — Other Ambulatory Visit: Payer: Self-pay

## 2019-02-08 DIAGNOSIS — R918 Other nonspecific abnormal finding of lung field: Secondary | ICD-10-CM

## 2019-02-08 DIAGNOSIS — J449 Chronic obstructive pulmonary disease, unspecified: Secondary | ICD-10-CM

## 2019-02-08 NOTE — Assessment & Plan Note (Signed)
Plan: Continue Breo Ellipta 200 Continue Incruse Ellipta Continue rescue inhaler as needed Follow-up with our office in 4 weeks to establish with Dr. Carlis Abbott Patient will need walk at next office visit to see if he qualifies for oxygen, if he does qualify for oxygen please go ahead and titrate for POC

## 2019-02-08 NOTE — Assessment & Plan Note (Signed)
Pulmonary nodule stable on November/2019 CT  Plan: We will monitor clinically no need to do further CT imaging at this time

## 2019-02-08 NOTE — Patient Instructions (Addendum)
Finish Amoxcillin as prescribed by PCP   Continue to use Tessalon pearles as outlined   Breo Ellipta 200 >>> Take 1 puff daily in the morning right when you wake up >>>Rinse your mouth out after use >>>This is a daily maintenance inhaler, NOT a rescue inhaler >>>Contact our office if you are having difficulties affording or obtaining this medication >>>It is important for you to be able to take this daily and not miss any doses   Incruse Ellipta z >>> Take 1 puff daily in the morning right when you wake up >>>Rinse your mouth out after use >>>This is a daily maintenance inhaler, NOT a rescue inhaler >>>Contact our office if you are having difficulties affording or obtaining this medication >>>It is important for you to be able to take this daily and not miss any doses    Only use your albuterol as a rescue medication to be used if you can't catch your breath by resting or doing a relaxed purse lip breathing pattern.  - The less you use it, the better it will work when you need it. - Ok to use up to 2 puffs  every 4 hours if you must but call for immediate appointment if use goes up over your usual need - Don't leave home without it !!  (think of it like the spare tire for your car)    Note your daily symptoms > remember "red flags" for COPD:   >>>Increase in cough >>>increase in sputum production >>>increase in shortness of breath or activity  intolerance.   If you notice these symptoms, please call the office to be seen.    Return in about 4 weeks (around 03/08/2019), or if symptoms worsen or fail to improve, for Follow up with Dr. Chestine Sporelark, with walk in office.    Coronavirus (COVID-19) Are you at risk?  Are you at risk for the Coronavirus (COVID-19)?  To be considered HIGH RISK for Coronavirus (COVID-19), you have to meet the following criteria:  . Traveled to [place], [place], [place], [place] or [place]; or in the [place] to [place], [place], [place], or [place]; and have fever, cough, and shortness of breath within the last 2 weeks of travel OR . Been in close contact with a person diagnosed with COVID-19 within the last 2 weeks and have fever, cough, and shortness of breath . IF YOU DO NOT MEET THESE CRITERIA, YOU ARE CONSIDERED LOW RISK FOR COVID-19.  What to do if you are HIGH RISK for COVID-19?  Marland Kitchen. If you are having a medical emergency, call 911. . Seek medical care right away. Before you go to a doctor's office, urgent care or emergency department, call ahead and tell them about your recent travel, contact with someone diagnosed with COVID-19, and your symptoms. You should receive instructions from your physician's office regarding next steps of care.  . When you arrive at healthcare provider, tell the healthcare staff immediately you have returned from visiting [place], [place], [place], [place] or [place]; or traveled in the [place] to [place], [place], [place], or [place]; in the last two weeks or you have been in close contact with a person diagnosed with COVID-19 in the last 2 weeks.   . Tell the health care staff about your symptoms: fever, cough and shortness of breath. . After you have been seen by a medical provider, you will be either: o Tested for (COVID-19) and discharged home on quarantine except to seek  medical care if symptoms worsen, and asked to  - Stay home and avoid contact with others until you get your results (4-5 days)  - Avoid travel on public transportation if possible (such as bus, train, or airplane) or o Sent to the Emergency Department by EMS for evaluation, COVID-19 testing, and possible admission depending on your condition and test results.  What to do if you are LOW RISK for COVID-19?  Reduce your risk of any infection by using the same precautions used for avoiding the common cold or flu:  Marland Kitchen Wash your hands often with soap and warm water for at least 20 seconds.  If soap and water are not readily  available, use an alcohol-based hand sanitizer with at least 60% alcohol.  . If coughing or sneezing, cover your mouth and nose by coughing or sneezing into the elbow areas of your shirt or coat, into a tissue or into your sleeve (not your hands). . Avoid shaking hands with others and consider head nods or verbal greetings only. . Avoid touching your eyes, nose, or mouth with unwashed hands.  . Avoid close contact with people who are sick. . Avoid places or events with large numbers of people in one location, like concerts or sporting events. . Carefully consider travel plans you have or are making. . If you are planning any travel outside or inside the [place], visit the CDC's Travelers' Health webpage for the latest health notices. . If you have some symptoms but not all symptoms, continue to monitor at home and seek medical attention if your symptoms worsen. . If you are having a medical emergency, call 911.   [place] / e-Visit: [email or web address]         [facility]: [facility]: [phone]                   [facility]: [phone]           It is flu season:   >>> Best ways to protect herself from the flu: Receive the yearly flu vaccine, practice good hand hygiene washing with soap and also using hand sanitizer when available, eat a nutritious meals, get adequate rest, hydrate appropriately   Please contact the office if your symptoms worsen or you have concerns that you are not improving.   Thank you for choosing [facility] for your healthcare, and for allowing [place] to partner with you on your healthcare journey. I am thankful to be able to provide care to you today.   Wyn Quaker FNP-C

## 2019-02-13 NOTE — Progress Notes (Signed)
Reviewed, agree 

## 2019-02-19 ENCOUNTER — Ambulatory Visit: Payer: Medicare HMO | Admitting: Critical Care Medicine

## 2019-02-19 NOTE — Progress Notes (Deleted)
Synopsis: Referred in August 2020 for abnormal chest CT by Gwenlyn FudgeJoyce, Britney F, FNP  Subjective:   PATIENT ID: Jason Yu GENDER: male DOB: 1950/11/15, MRN: [id number]  No chief complaint on file.   Jason Yu is a 68 year old gentleman with a past medical history of COPD.  Current regimen is LABA/LAMA/ICS.  When [facility], he initially had a significant symptomatic benefit with reduced albuterol usage.  His albuterol requirements have since increased to about 2 times a day.  He recently had a course of amoxicillin for an acute COPD exacerbation.  [facility]04/2019 per his report.  He has felt for some time that he would benefit from supplemental oxygen, but has never been formally tested in our office.  He has [place] oxygen on [place] and wants a Designer, jewelleryportable concentrator.     Tobacco? Cough Wheeze Sputum Can walk 1 block/a mile *** ***  Past Medical History:  Diagnosis Date  . Anxiety   . Asthma    hasn't used inhaler at least 4-5 months.  . Complete heart block [facility](HCC)    with prior syncope s/p PPM 2004 in [place] (MDT Kappa);  08/16/2011 PPM Gen. Change: MDT Adapta L ADDDR1, [id number] H.  . Complication of anesthesia 05/2011   had difficulty breathing after surgery=C8 nerve root compression  . DDD (degenerative disc disease)    with multiple prior back surgeries   . GERD (gastroesophageal reflux disease)   . Hepatitis    Hx Hepatitis A & B  . HLD (hyperlipidemia)   . HTN (hypertension), benign   . Pacemaker   . Pneumonia last 1998   has had 8x  . PONV (postoperative nausea and vomiting)   . Shortness of breath   . Syncope    a.  2001 - normal cath;  b. 2012 Normal Myoview, EF 58%.     Family History  Problem Relation Age of Onset  . Diabetes Neg Hx   . Hypertension Neg Hx   . Coronary artery disease Neg Hx      Past Surgical History:  Procedure Laterality Date  . BACK SURGERY  08/2009, 09/2011  . CARDIAC  CATHETERIZATION  2001   no significant CAD  . CARPAL TUNNEL RELEASE  1979  . cervical neck surgeries    . INSERT / REPLACE / REMOVE PACEMAKER  08/2011   initially inserted 12/2002  . pacemaker generator change  08/16/11   Medtronic Adapta L implanted by Dr Johney FrameAllred, lead implangted 2004 in [place]  . PACEMAKER GENERATOR CHANGE Left 08/16/2011   Procedure: PACEMAKER GENERATOR CHANGE;  Surgeon: Hillis RangeJames Allred, MD;  [facility];  Service: Cardiovascular;  Laterality: Left;  . PACEMAKER INSERTION     Implanted 2004 in [place]  . SHOULDER ARTHROSCOPY DISTAL CLAVICLE EXCISION AND OPEN ROTATOR CUFF REPAIR  2009   right  . SHOULDER ARTHROSCOPY DISTAL CLAVICLE EXCISION AND OPEN ROTATOR CUFF REPAIR  2010   Left  . ULNAR TUNNEL RELEASE  1980's(late) or early 1990's    Social History   Socioeconomic History  . Marital status: Single    Spouse name: Not on file  . Number of children: Not on file  . Years of education: Not on file  . Highest education level: Not on file  Occupational History  . Not on file  Social Needs  . Financial resource strain: Not on file  . Food insecurity    Worry: Not on file    Inability:  Not on file  . Transportation needs    Medical: Not on file    Non-medical: Not on file  Tobacco Use  . Smoking status: Former Smoker    Packs/day: 2.00    Years: 3.00    Pack years: 6.00    Types: Cigarettes    Start date: 07/11/1968    Quit date: 11/29/1977    Years since quitting: 41.2  . Smokeless tobacco: Never Used  Substance and Sexual Activity  . Alcohol use: No    Alcohol/week: 0.0 standard drinks  . Drug use: Yes    Types: Marijuana    Comment: smokes occasional marijuana, no ready to quit  . Sexual activity: Not on file  Lifestyle  . Physical activity    Days per week: Not on file    Minutes per session: Not on file  . Stress: Not on file  Relationships  . Social Herbalist on phone: Not on file    Gets together: Not on file    Attends  religious service: Not on file    Active member of club or organization: Not on file    Attends meetings of clubs or organizations: Not on file    Relationship status: Not on file  . Intimate partner violence    Fear of current or ex partner: Not on file    Emotionally abused: Not on file    Physically abused: Not on file    Forced sexual activity: Not on file  Other Topics Concern  . Not on file  Social History Narrative   Lives alone     Allergies  Allergen Reactions  . Other Other (See Comments)    Nuclear Stress Medicine- made him feel" out of touch" w/ reality.  . Adhesive [Tape] Other (See Comments)    Pulls skin off, Please use "paper" tape  . Codeine Itching and Rash  . Prednisone Other (See Comments)      pain in joints, high doses     Immunization History  Administered Date(s) Administered  . Influenza Split 04/17/2017  . Influenza,inj,Quad PF,6+ Mos 05/17/2014    Outpatient Medications Prior to Visit  Medication Sig Dispense Refill  . amLODipine (NORVASC) 10 MG tablet TAKE 1/2 TABLET BY MOUTH DAILY 45 tablet 1  . Ascorbic Acid (VITAMIN C) 1000 MG tablet Take 1,000 mg by mouth daily.      Marland Kitchen atorvastatin (LIPITOR) 40 MG tablet TAKE 1 TABLET BY MOUTH DAILY 90 tablet 1  . Cholecalciferol (VITAMIN D) 2000 UNITS CAPS Take 2,000 Units by mouth daily.     Marland Kitchen co-enzyme Q-10 30 MG capsule Take 30 mg by mouth daily.    . diazepam (VALIUM) 10 MG tablet Take 10 mg by mouth every 8 (eight) hours as needed. For anxiety    . Flaxseed, Linseed, (FLAX SEED OIL) 1000 MG CAPS Take 4,000 mg by mouth daily.    . fluticasone furoate-vilanterol (BREO ELLIPTA) 200-25 MCG/INH AEPB Inhale 1 puff into the lungs daily. 60 each 0  . hydrochlorothiazide (HYDRODIURIL) 25 MG tablet TAKE 1 TABLET BY MOUTH DAILY 90 tablet 1  . HYDROcodone-acetaminophen (NORCO) 10-325 MG per tablet Take 1 tablet by mouth every 6 (six) hours as needed for moderate pain. For pain    . HYDROmorphone (DILAUDID) 4 MG  tablet Take 4 mg by mouth 3 (three) times daily as needed.     . Menthol, Topical Analgesic, (BLUE-EMU MAXIMUM STRENGTH) 2.5 % LIQD Apply 1 application topically 3 (three) times  daily as needed (for muscle pain).    . Multiple Vitamin (MULTIVITAMIN) tablet Take 1 tablet by mouth daily.      Marland Kitchen. omeprazole (PRILOSEC) 20 MG capsule Take 20 mg by mouth 2 (two) times daily as needed (for heartburn or acid reflux).     Marland Kitchen. PARoxetine (PAXIL) 20 MG tablet Take 20 mg by mouth daily.     . VENTOLIN HFA 108 (90 Base) MCG/ACT inhaler INHALE TWO PUFFS BY MOUTH EVERY 6 HOURS AS NEEDED SHORTNESS OF BREATH 18 g 0  . vitamin B-12 (CYANOCOBALAMIN) 1000 MCG tablet Take 1,000 mcg by mouth daily.    . vitamin E 1000 UNIT capsule Take 1,000 Units by mouth daily.     No facility-administered medications prior to visit.     ROS   Objective:  There were no vitals filed for this visit.   on *** LPM *** RA BMI Readings from Last 3 Encounters:  08/07/18 29.55 kg/m  05/01/18 28.79 kg/m  11/17/17 28.26 kg/m   Wt Readings from Last 3 Encounters:  08/07/18 177 lb 9.6 oz (80.6 kg)  05/01/18 173 lb (78.5 kg)  11/17/17 169 lb 12.8 oz (77 kg)    Physical Exam   CBC    Component Value Date/Time   WBC 6.4 02/21/2012 1227   RBC 4.82 02/21/2012 1227   HGB 14.8 02/21/2012 1227   HCT 42.9 02/21/2012 1227   PLT 231 02/21/2012 1227   MCV 89.0 02/21/2012 1227   MCH 30.7 02/21/2012 1227   MCHC 34.5 02/21/2012 1227   RDW 13.3 02/21/2012 1227   LYMPHSABS 1.2 07/19/2009 2100   MONOABS 0.6 07/19/2009 2100   EOSABS 0.0 07/19/2009 2100   BASOSABS 0.0 07/19/2009 2100    ***  Chest Imaging- films reviewed: With contrast 05/14/2018- no mediastinal adenopathy.  Two left upper lobe 4 mm nodules (series 3, slides 38 and 67).  Right upper lobe subpleural nodule slide 74.  6.6 mm nodule in the left lower lobe superior segment abutting the fissure (slide 85).  Per the radiologist report, in comparison to CTs from the [facility]  [place] system performed on 10/24/2016 and 06/19/2016, these nodules are stable.  Pulmonary Functions Testing Results: No flowsheet data found.   Echocardiogram: None available in our system  Heart Catheterization: None available in our system    Assessment & Plan:   No diagnosis found.    Current Outpatient Medications:  .  amLODipine (NORVASC) 10 MG tablet, TAKE 1/2 TABLET BY MOUTH DAILY, Disp: 45 tablet, Rfl: 1 .  Ascorbic Acid (VITAMIN C) 1000 MG tablet, Take 1,000 mg by mouth daily.  , Disp: , Rfl:  .  atorvastatin (LIPITOR) 40 MG tablet, TAKE 1 TABLET BY MOUTH DAILY, Disp: 90 tablet, Rfl: 1 .  Cholecalciferol (VITAMIN D) 2000 UNITS CAPS, Take 2,000 Units by mouth daily. , Disp: , Rfl:  .  co-enzyme Q-10 30 MG capsule, Take 30 mg by mouth daily., Disp: , Rfl:  .  diazepam (VALIUM) 10 MG tablet, Take 10 mg by mouth every 8 (eight) hours as needed. For anxiety, Disp: , Rfl:  .  Flaxseed, Linseed, (FLAX SEED OIL) 1000 MG CAPS, Take 4,000 mg by mouth daily., Disp: , Rfl:  .  fluticasone furoate-vilanterol (BREO ELLIPTA) 200-25 MCG/INH AEPB, Inhale 1 puff into the lungs daily., Disp: 60 each, Rfl: 0 .  hydrochlorothiazide (HYDRODIURIL) 25 MG tablet, TAKE 1 TABLET BY MOUTH DAILY, Disp: 90 tablet, Rfl: 1 .  HYDROcodone-acetaminophen (NORCO) 10-325 MG per tablet, Take 1  tablet by mouth every 6 (six) hours as needed for moderate pain. For pain, Disp: , Rfl:  .  HYDROmorphone (DILAUDID) 4 MG tablet, Take 4 mg by mouth 3 (three) times daily as needed. , Disp: , Rfl:  .  Menthol, Topical Analgesic, (BLUE-EMU MAXIMUM STRENGTH) 2.5 % LIQD, Apply 1 application topically 3 (three) times daily as needed (for muscle pain)., Disp: , Rfl:  .  Multiple Vitamin (MULTIVITAMIN) tablet, Take 1 tablet by mouth daily.  , Disp: , Rfl:  .  omeprazole (PRILOSEC) 20 MG capsule, Take 20 mg by mouth 2 (two) times daily as needed (for heartburn or acid reflux). , Disp: , Rfl:  .  PARoxetine (PAXIL) 20 MG tablet,  Take 20 mg by mouth daily. , Disp: , Rfl:  .  VENTOLIN HFA 108 (90 Base) MCG/ACT inhaler, INHALE TWO PUFFS BY MOUTH EVERY 6 HOURS AS NEEDED SHORTNESS OF BREATH, Disp: 18 g, Rfl: 0 .  vitamin B-12 (CYANOCOBALAMIN) 1000 MCG tablet, Take 1,000 mcg by mouth daily., Disp: , Rfl:  .  vitamin E 1000 UNIT capsule, Take 1,000 Units by mouth daily., Disp: , Rfl:    Steffanie DunnLaura P Kern Gingras, DO [facility] 02/19/2019 8:06 AM

## 2019-02-24 ENCOUNTER — Other Ambulatory Visit: Payer: Self-pay | Admitting: Cardiology

## 2019-02-25 ENCOUNTER — Other Ambulatory Visit: Payer: Self-pay | Admitting: Pulmonary Disease

## 2019-03-22 ENCOUNTER — Encounter: Payer: Medicare HMO | Admitting: *Deleted

## 2019-03-24 ENCOUNTER — Other Ambulatory Visit: Payer: Self-pay | Admitting: Cardiology

## 2019-03-29 ENCOUNTER — Encounter: Payer: Self-pay | Admitting: Cardiology

## 2019-04-05 ENCOUNTER — Other Ambulatory Visit: Payer: Self-pay | Admitting: Pulmonary Disease

## 2019-04-21 ENCOUNTER — Other Ambulatory Visit: Payer: Self-pay | Admitting: Rehabilitation

## 2019-04-21 DIAGNOSIS — M545 Low back pain, unspecified: Secondary | ICD-10-CM

## 2019-04-21 DIAGNOSIS — M4035 Flatback syndrome, thoracolumbar region: Secondary | ICD-10-CM

## 2019-04-21 DIAGNOSIS — G8929 Other chronic pain: Secondary | ICD-10-CM

## 2019-04-22 ENCOUNTER — Telehealth: Payer: Self-pay | Admitting: Nurse Practitioner

## 2019-04-22 ENCOUNTER — Other Ambulatory Visit: Payer: Self-pay | Admitting: Orthopedic Surgery

## 2019-04-22 NOTE — Telephone Encounter (Signed)
Phone call to patient to verify medication list and allergies for myelogram procedure. Pt instructed to hold Paxil for 48hrs prior to myelogram appointment time. Pt verbalized understanding. Pre and post procedure instructions reviewed with pt. 

## 2019-04-23 ENCOUNTER — Other Ambulatory Visit: Payer: Self-pay | Admitting: Pulmonary Disease

## 2019-04-28 ENCOUNTER — Other Ambulatory Visit: Payer: Medicare HMO

## 2019-04-28 ENCOUNTER — Inpatient Hospital Stay: Admission: RE | Admit: 2019-04-28 | Payer: Medicare HMO | Source: Ambulatory Visit

## 2019-05-04 NOTE — Discharge Instructions (Signed)

## 2019-05-05 ENCOUNTER — Other Ambulatory Visit: Payer: Medicare HMO

## 2019-05-05 ENCOUNTER — Inpatient Hospital Stay
Admission: RE | Admit: 2019-05-05 | Discharge: 2019-05-05 | Disposition: A | Discharge status: Home / Self Care | Payer: Medicare HMO | Source: Ambulatory Visit | Attending: Rehabilitation | Admitting: Rehabilitation

## 2019-05-07 ENCOUNTER — Encounter: Payer: Self-pay | Admitting: Internal Medicine

## 2019-05-07 ENCOUNTER — Telehealth (INDEPENDENT_AMBULATORY_CARE_PROVIDER_SITE_OTHER): Payer: Medicare HMO | Admitting: Internal Medicine

## 2019-05-07 ENCOUNTER — Ambulatory Visit (INDEPENDENT_AMBULATORY_CARE_PROVIDER_SITE_OTHER): Payer: Medicare HMO | Admitting: *Deleted

## 2019-05-07 VITALS — BP 111/79 | HR 67 | Ht 65.0 in | Wt 167.0 lb

## 2019-05-07 DIAGNOSIS — I10 Essential (primary) hypertension: Secondary | ICD-10-CM

## 2019-05-07 DIAGNOSIS — I442 Atrioventricular block, complete: Secondary | ICD-10-CM | POA: Diagnosis not present

## 2019-05-07 NOTE — Progress Notes (Signed)
Electrophysiology TeleHealth Note   Due to national recommendations of social distancing due to COVID 19, an audio telehealth visit is felt to be most appropriate for this patient at this time.  Verbal consent was obtained by me for the telehealth visit today.  The patient does not have capability for a virtual visit.  A phone visit is therefore required today.   Date:  05/07/2019   ID:  Jason Yu, DOB March 16, 1951, MRN [id number]  Location: patient's home  [facility]  Evaluation Performed: Follow-up visit  PCP:  Kela MillinBarrino, Alethea Y, MD   Electrophysiologist:  Dr Johney FrameAllred  Chief Complaint:  Pacemaker follow up  History of Present Illness:    Jason Yu is a 68 y.o. male who presents via telehealth conferencing today.  Since last being seen in our clinic, the patient reports doing relatively well.  He continues to struggle with back pain.  His shortness of breath is stable. Today, he denies symptoms of palpitations, chest, pain, lower extremity edema, dizziness, presyncope, or syncope.  The patient is otherwise without complaint today.  The patient denies symptoms of fevers, chills, cough, or new SOB worrisome for COVID 19.  Past Medical History:  Diagnosis Date  . Anxiety   . Asthma    hasn't used inhaler at least 4-5 months.  . Complete heart block [facility](HCC)    with prior syncope s/p PPM 2004 in [place] (MDT Kappa);  08/16/2011 PPM Gen. Change: MDT Adapta L ADDDR1, [id number] H.  . Complication of anesthesia 05/2011   had difficulty breathing after surgery=C8 nerve root compression  . DDD (degenerative disc disease)    with multiple prior back surgeries   . GERD (gastroesophageal reflux disease)   . Hepatitis    Hx Hepatitis A & B  . HLD (hyperlipidemia)   . HTN (hypertension), benign   . Pacemaker   . Pneumonia last 1998   has had 8x  . PONV (postoperative nausea and vomiting)   . Shortness of breath   . Syncope    a.  2001 - normal cath;  b.  2012 Normal Myoview, EF 58%.    Past Surgical History:  Procedure Laterality Date  . BACK SURGERY  08/2009, 09/2011  . CARDIAC CATHETERIZATION  2001   no significant CAD  . CARPAL TUNNEL RELEASE  1979  . cervical neck surgeries    . INSERT / REPLACE / REMOVE PACEMAKER  08/2011   initially inserted 12/2002  . pacemaker generator change  08/16/11   Medtronic Adapta L implanted by Dr Johney FrameAllred, lead implangted 2004 in [place]  . PACEMAKER GENERATOR CHANGE Left 08/16/2011   Procedure: PACEMAKER GENERATOR CHANGE;  Surgeon: Hillis RangeJames Jaydin Jalomo, MD;  [facility];  Service: Cardiovascular;  Laterality: Left;  . PACEMAKER INSERTION     Implanted 2004 in [place]  . SHOULDER ARTHROSCOPY DISTAL CLAVICLE EXCISION AND OPEN ROTATOR CUFF REPAIR  2009   right  . SHOULDER ARTHROSCOPY DISTAL CLAVICLE EXCISION AND OPEN ROTATOR CUFF REPAIR  2010   Left  . ULNAR TUNNEL RELEASE  1980's(late) or early 1990's    Current Outpatient Medications  Medication Sig Dispense Refill  . amLODipine (NORVASC) 10 MG tablet TAKE 1/2 TABLET BY MOUTH DAILY 45 tablet 1  . Ascorbic Acid (VITAMIN C) 1000 MG tablet Take 1,000 mg by mouth daily.      Marland Kitchen. atorvastatin (LIPITOR) 40 MG tablet TAKE 1 TABLET BY MOUTH DAILY 90 tablet 1  . BREO ELLIPTA 200-25 MCG/INH AEPB  inhale one PUFF into lungs daily 60 each 2  . Cholecalciferol (VITAMIN D) 2000 UNITS CAPS Take 2,000 Units by mouth daily.     Marland Kitchen co-enzyme Q-10 30 MG capsule Take 30 mg by mouth daily.    . diazepam (VALIUM) 10 MG tablet Take 20 mg by mouth daily as needed. For anxiety    . Flaxseed, Linseed, (FLAX SEED OIL) 1000 MG CAPS Take 4,000 mg by mouth daily.    . hydrochlorothiazide (HYDRODIURIL) 25 MG tablet TAKE 1 TABLET BY MOUTH DAILY 90 tablet 1  . meclizine (ANTIVERT) 12.5 MG tablet Take 12.5 mg by mouth 3 (three) times daily as needed for dizziness.    . Menthol, Topical Analgesic, (BLUE-EMU MAXIMUM STRENGTH) 2.5 % LIQD Apply 1 application topically 3 (three) times daily as  needed (for muscle pain).    . Multiple Vitamin (MULTIVITAMIN) tablet Take 1 tablet by mouth daily.      Marland Kitchen omeprazole (PRILOSEC) 20 MG capsule Take 20 mg by mouth 2 (two) times daily as needed (for heartburn or acid reflux).     Marland Kitchen PARoxetine (PAXIL) 20 MG tablet Take 20 mg by mouth daily.     Marland Kitchen umeclidinium bromide (INCRUSE ELLIPTA) 62.5 MCG/INH AEPB Inhale 1 puff into the lungs daily.    . VENTOLIN HFA 108 (90 Base) MCG/ACT inhaler INHALE TWO PUFFS BY MOUTH EVERY 6 HOURS AS NEEDED SHORTNESS OF BREATH 18 g 0  . vitamin B-12 (CYANOCOBALAMIN) 1000 MCG tablet Take 1,000 mcg by mouth daily.    . vitamin E 1000 UNIT capsule Take 1,000 Units by mouth daily.     No current facility-administered medications for this visit.     Allergies:   Other, Adhesive [tape], Codeine, and Prednisone   Social History:  The patient  reports that he quit smoking about 41 years ago. His smoking use included cigarettes. He started smoking about 50 years ago. He has a 6.00 pack-year smoking history. He has never used smokeless tobacco. He reports current drug use. Drug: Marijuana. He reports that he does not drink alcohol.   Family History:  The patient's family history includes hypertension   ROS:  Please see the history of present illness.   All other systems are personally reviewed and negative.    Exam:    Vital Signs:  BP 111/79   Pulse 67   Ht 5\' 5"  (1.651 m)   Wt 167 lb (75.8 kg)   BMI 27.79 kg/m   Well sounding and appearing, alert and conversant, regular work of breathing   Labs/Other Tests and Data Reviewed:    Recent Labs: No results found for requested labs within last 8760 hours.   Wt Readings from Last 3 Encounters:  05/07/19 167 lb (75.8 kg)  08/07/18 177 lb 9.6 oz (80.6 kg)  05/01/18 173 lb (78.5 kg)     Last device remote is reviewed from [place] PDF which reveals normal device function    ASSESSMENT & PLAN:    1.  Complete heart block Overdue for remotes Remote from June with  normal device function See [employer] report I have asked him to send remote today  2.  HTN Stable No change required today   Follow-up:  [facility], 1 year with me    Patient Risk:  after full review of this patients clinical status, I feel that they are at moderate risk at this time.  Today, I have spent 15 minutes with the patient with telehealth technology discussing arrhythmia management .  Randolm Idol, MD  05/07/2019 11:45 AM     [facility] [address] [place] [address] [phone] (office) [phone] (fax)

## 2019-05-07 NOTE — Patient Instructions (Signed)
Medication Instructions:  Continue all current medications.  Labwork: none  Testing/Procedures: none  Follow-Up: 1 year   Any Other Special Instructions Will Be Listed Below (If Applicable). Next remote as planned.   If you need a refill on your cardiac medications before your next appointment, please call your pharmacy.  

## 2019-05-11 LAB — CUP PACEART REMOTE DEVICE CHECK
Battery Impedance: 1021 Ohm
Battery Remaining Longevity: 51 mo
Battery Voltage: 2.77 V
Brady Statistic AP VP Percent: 20 %
Brady Statistic AP VS Percent: 0 %
Brady Statistic AS VP Percent: 80 %
Brady Statistic AS VS Percent: 0 %
Date Time Interrogation Session: 20201108152543
Implantable Lead Implant Date: 20040709
Implantable Lead Implant Date: 20040709
Implantable Lead Location: 753859
Implantable Lead Location: 753860
Implantable Lead Model: 5076
Implantable Lead Model: 5076
Implantable Pulse Generator Implant Date: 20130215
Lead Channel Impedance Value: 388 Ohm
Lead Channel Impedance Value: 548 Ohm
Lead Channel Pacing Threshold Amplitude: 0.625 V
Lead Channel Pacing Threshold Amplitude: 1.5 V
Lead Channel Pacing Threshold Pulse Width: 0.4 ms
Lead Channel Pacing Threshold Pulse Width: 0.4 ms
Lead Channel Setting Pacing Amplitude: 2 V
Lead Channel Setting Pacing Amplitude: 3 V
Lead Channel Setting Pacing Pulse Width: 0.4 ms
Lead Channel Setting Sensing Sensitivity: 5.6 mV

## 2019-05-14 ENCOUNTER — Other Ambulatory Visit: Payer: Self-pay | Admitting: Pulmonary Disease

## 2019-05-23 NOTE — Progress Notes (Signed)
Remote pacemaker transmission.   

## 2019-06-15 ENCOUNTER — Other Ambulatory Visit: Payer: Self-pay | Admitting: Pulmonary Disease

## 2019-07-29 ENCOUNTER — Other Ambulatory Visit: Payer: Self-pay | Admitting: Pulmonary Disease

## 2019-08-06 ENCOUNTER — Ambulatory Visit (INDEPENDENT_AMBULATORY_CARE_PROVIDER_SITE_OTHER): Payer: Medicare HMO | Admitting: *Deleted

## 2019-08-06 DIAGNOSIS — Z950 Presence of cardiac pacemaker: Secondary | ICD-10-CM | POA: Diagnosis not present

## 2019-08-09 ENCOUNTER — Telehealth: Payer: Self-pay | Admitting: Cardiology

## 2019-08-09 LAB — CUP PACEART REMOTE DEVICE CHECK
Battery Impedance: 1155 Ohm
Battery Remaining Longevity: 52 mo
Battery Voltage: 2.78 V
Brady Statistic AP VP Percent: 20 %
Brady Statistic AP VS Percent: 0 %
Brady Statistic AS VP Percent: 80 %
Brady Statistic AS VS Percent: 0 %
Date Time Interrogation Session: 20210208115349
Implantable Lead Implant Date: 20040709
Implantable Lead Implant Date: 20040709
Implantable Lead Location: 753859
Implantable Lead Location: 753860
Implantable Lead Model: 5076
Implantable Lead Model: 5076
Implantable Pulse Generator Implant Date: 20130215
Lead Channel Impedance Value: 439 Ohm
Lead Channel Impedance Value: 580 Ohm
Lead Channel Pacing Threshold Amplitude: 0.625 V
Lead Channel Pacing Threshold Amplitude: 1.25 V
Lead Channel Pacing Threshold Pulse Width: 0.4 ms
Lead Channel Pacing Threshold Pulse Width: 0.4 ms
Lead Channel Setting Pacing Amplitude: 2 V
Lead Channel Setting Pacing Amplitude: 2.5 V
Lead Channel Setting Pacing Pulse Width: 0.4 ms
Lead Channel Setting Sensing Sensitivity: 5.6 mV

## 2019-08-09 NOTE — Telephone Encounter (Signed)
Put on 10-12 pounds since pandemic - having issue with watering & blurry eyes.  Was concerned so he bought new bp monitor - readings been elevated.  Has COPD, so has SOB all the time.  No chest pain, dizziness, fever or covid related symptoms, can smell & taste fine.      169/78 - 2 weeks ago - bp was previously running much lower   148/85 at lunch time  Stated that he has been taking 5mg  twice a day & that is bringing down the evening BP readings.    118/78 after the second dose this evening.    VV scheduled for 08/17/2019.

## 2019-08-09 NOTE — Telephone Encounter (Signed)
Patient called stating that his BP has been running high.  He is requesting to have amLODipine (NORVASC) 10 MG tablet   refilled. States that he has been taking whole pill vs half.

## 2019-08-10 NOTE — Telephone Encounter (Signed)
Increase norvasc to 10mg  daily, keep track of bp's until our f/u appt   MD

## 2019-08-10 NOTE — Telephone Encounter (Signed)
Voice mail not activated.

## 2019-08-16 NOTE — Telephone Encounter (Signed)
Patient notified and verbalized understanding. 

## 2019-08-17 ENCOUNTER — Telehealth (INDEPENDENT_AMBULATORY_CARE_PROVIDER_SITE_OTHER): Payer: Medicare HMO | Admitting: Cardiology

## 2019-08-17 ENCOUNTER — Encounter: Payer: Self-pay | Admitting: Cardiology

## 2019-08-17 VITALS — BP 160/71 | HR 60 | Ht 65.0 in | Wt 170.0 lb

## 2019-08-17 DIAGNOSIS — I10 Essential (primary) hypertension: Secondary | ICD-10-CM

## 2019-08-17 NOTE — Patient Instructions (Signed)
Medication Instructions:  Continue all current medications.   Labwork: none  Testing/Procedures: none  Follow-Up: 6 months   Any Other Special Instructions Will Be Listed Below (If Applicable).   If you need a refill on your cardiac medications before your next appointment, please call your pharmacy.  

## 2019-08-17 NOTE — Progress Notes (Signed)
Virtual Visit via Telephone Note   This visit type was conducted due to national recommendations for restrictions regarding the COVID-19 Pandemic (e.g. social distancing) in an effort to limit this patient's exposure and mitigate transmission in our community.  Due to his co-morbid illnesses, this patient is at least at moderate risk for complications without adequate follow up.  This format is felt to be most appropriate for this patient at this time.  The patient did not have access to video technology/had technical difficulties with video requiring transitioning to audio format only (telephone).  All issues noted in this document were discussed and addressed.  No physical exam could be performed with this format.  Please refer to the patient's chart for his  consent to telehealth for [facility].   Date:  08/17/2019   ID:  Jason Yu, DOB 1950-08-27, MRN [id number]  Patient Location: Home Provider Location: Office  PCP:  Sandi Mealy, MD  Cardiologist:  Carlyle Dolly, MD  Electrophysiologist:  Thompson Grayer, MD   Evaluation Performed:  Follow-Up Visit  Chief Complaint:  Follow up visit  History of Present Illness:    Jason Yu is a 69 y.o. male seen today for follow up of the following medical problems. This is a focused visit on recent issues with high blood pressure, for more detailed history please refer to prior clinic notes.   1. HTN  - he called 08/2019 about some elevated bp's. We increased his norvasc to 10mg  daily. - has gained some weight during the pandemic, around 10 to 12 lbs - home bp's 170s/80s last week - home 130s/70s since increasing norvasc.       The patient does not have symptoms concerning for COVID-19 infection (fever, chills, cough, or new shortness of breath).    Past Medical History:  Diagnosis Date  . Anxiety   . Asthma    hasn't used inhaler at least 4-5 months.  . Complete heart block [facility])    with prior syncope s/p  PPM 2004 in [place] (MDT [place]);  08/16/2011 PPM Gen. Change: MDT [place], [id number] H.  . Complication of anesthesia 05/2011   had difficulty breathing after surgery=C8 nerve root compression  . DDD (degenerative disc disease)    with multiple prior back surgeries   . GERD (gastroesophageal reflux disease)   . Hepatitis    Hx Hepatitis A & B  . HLD (hyperlipidemia)   . HTN (hypertension), benign   . Pacemaker   . Pneumonia last 1998   has had 8x  . PONV (postoperative nausea and vomiting)   . Shortness of breath   . Syncope    a.  2001 - normal cath;  b. 2012 Normal Myoview, EF 58%.   Past Surgical History:  Procedure Laterality Date  . BACK SURGERY  08/2009, 09/2011  . CARDIAC CATHETERIZATION  2001   no significant CAD  . CARPAL TUNNEL RELEASE  1979  . cervical neck surgeries    . INSERT / REPLACE / REMOVE PACEMAKER  08/2011   initially inserted 12/2002  . pacemaker generator change  08/16/11   Medtronic Adapta L implanted by Dr Rayann Heman, lead implangted 2004 in [place]  . PACEMAKER GENERATOR CHANGE Left 08/16/2011   Procedure: PACEMAKER GENERATOR CHANGE;  Surgeon: Thompson Grayer, MD;  Location: [facility] CATH LAB;  Service: Cardiovascular;  Laterality: Left;  . PACEMAKER INSERTION     Implanted 2004 in [place]  . SHOULDER ARTHROSCOPY DISTAL CLAVICLE EXCISION AND OPEN ROTATOR CUFF REPAIR  2009   right  . SHOULDER ARTHROSCOPY DISTAL CLAVICLE EXCISION AND OPEN ROTATOR CUFF REPAIR  2010   Left  . ULNAR TUNNEL RELEASE  1980's(late) or early 1990's     No outpatient medications have been marked as taking for the 08/17/19 encounter (Appointment) with Antoine Poche, MD.     Allergies:   Other, Adhesive [tape], Codeine, and Prednisone   Social History   Tobacco Use  . Smoking status: Former Smoker    Packs/day: 2.00    Years: 3.00    Pack years: 6.00    Types: Cigarettes    Start date: 07/11/1968    Quit date: 11/29/1977    Years since quitting: 41.7  . Smokeless tobacco:  Never Used  Substance Use Topics  . Alcohol use: No    Alcohol/week: 0.0 standard drinks  . Drug use: Yes    Types: Marijuana    Comment: smokes occasional marijuana, no ready to quit     Family Hx: The patient's family history is negative for Diabetes, Hypertension, and Coronary artery disease.  ROS:   Please see the history of present illness.     All other systems reviewed and are negative.   Prior CV studies:   The following studies were reviewed today:   Labs/Other Tests and Data Reviewed:    EKG:  No ECG reviewed.  Recent Labs: No results found for requested labs within last 8760 hours.   Recent Lipid Panel No results found for: CHOL, TRIG, HDL, CHOLHDL, LDLCALC, LDLDIRECT  Wt Readings from Last 3 Encounters:  05/07/19 167 lb (75.8 kg)  08/07/18 177 lb 9.6 oz (80.6 kg)  05/01/18 173 lb (78.5 kg)     Objective:    Vital Signs:   Today's Vitals   08/17/19 1018  BP: (!) 160/71  Pulse: 60  Weight: 170 lb (77.1 kg)  Height: 5\' 5"  (1.651 m)   Body mass index is 28.29 kg/m.   Normal affect. Normal speech pattern and tone. Comfortable, no apparent distress. No audible signs of SOB or wheezing.   ASSESSMENT & PLAN:    1. HTN - some recent high bp's, likely uptrend due to recent weight gain - bp's improved with increasing norvasc to 10mg  daily. Home bp's 130s/70s, he will continue to monitor - if recurrent issues with high bp's would change his HCTZ to chlorthalidone   COVID-19 Education: The signs and symptoms of COVID-19 were discussed with the patient and how to seek care for testing (follow up with PCP or arrange E-visit).  The importance of social distancing was discussed today.  Time:   Today, I have spent 15 minutes with the patient with telehealth technology discussing the above problems.     Medication Adjustments/Labs and Tests Ordered: Current medicines are reviewed at length with the patient today.  Concerns regarding medicines are  outlined above.   Tests Ordered: No orders of the defined types were placed in this encounter.   Medication Changes: No orders of the defined types were placed in this encounter.   Follow Up:  Either In Person or Virtual in 6 month(s)  Signed, , MD  08/17/2019 9:03 AM    [facility]

## 2019-08-22 ENCOUNTER — Other Ambulatory Visit: Payer: Self-pay | Admitting: Cardiology

## 2019-08-22 ENCOUNTER — Other Ambulatory Visit: Payer: Self-pay | Admitting: Pulmonary Disease

## 2019-08-30 ENCOUNTER — Other Ambulatory Visit: Payer: Self-pay | Admitting: Pulmonary Disease

## 2019-09-19 ENCOUNTER — Other Ambulatory Visit: Payer: Self-pay | Admitting: Cardiology

## 2019-11-05 ENCOUNTER — Telehealth: Payer: Self-pay

## 2019-11-05 ENCOUNTER — Ambulatory Visit (INDEPENDENT_AMBULATORY_CARE_PROVIDER_SITE_OTHER): Payer: Medicare HMO | Admitting: *Deleted

## 2019-11-05 DIAGNOSIS — I442 Atrioventricular block, complete: Secondary | ICD-10-CM

## 2019-11-05 NOTE — Telephone Encounter (Signed)
Spoke with patient to remind of missed remote transmission 

## 2019-11-08 LAB — CUP PACEART REMOTE DEVICE CHECK
Battery Impedance: 1236 Ohm
Battery Remaining Longevity: 49 mo
Battery Voltage: 2.78 V
Brady Statistic AP VP Percent: 21 %
Brady Statistic AP VS Percent: 0 %
Brady Statistic AS VP Percent: 79 %
Brady Statistic AS VS Percent: 0 %
Date Time Interrogation Session: 20210507172125
Implantable Lead Implant Date: 20040709
Implantable Lead Implant Date: 20040709
Implantable Lead Location: 753859
Implantable Lead Location: 753860
Implantable Lead Model: 5076
Implantable Lead Model: 5076
Implantable Pulse Generator Implant Date: 20130215
Lead Channel Impedance Value: 404 Ohm
Lead Channel Impedance Value: 567 Ohm
Lead Channel Pacing Threshold Amplitude: 0.625 V
Lead Channel Pacing Threshold Amplitude: 1.25 V
Lead Channel Pacing Threshold Pulse Width: 0.4 ms
Lead Channel Pacing Threshold Pulse Width: 0.4 ms
Lead Channel Setting Pacing Amplitude: 2 V
Lead Channel Setting Pacing Amplitude: 2.5 V
Lead Channel Setting Pacing Pulse Width: 0.4 ms
Lead Channel Setting Sensing Sensitivity: 5.6 mV

## 2019-11-08 NOTE — Progress Notes (Signed)
Remote pacemaker transmission.   

## 2019-12-27 ENCOUNTER — Other Ambulatory Visit: Payer: Self-pay | Admitting: Pulmonary Disease

## 2020-02-15 ENCOUNTER — Encounter: Payer: Self-pay | Admitting: Cardiology

## 2020-02-15 ENCOUNTER — Other Ambulatory Visit: Payer: Self-pay | Admitting: Cardiology

## 2020-02-15 ENCOUNTER — Telehealth: Payer: Self-pay | Admitting: Cardiology

## 2020-02-15 ENCOUNTER — Telehealth (INDEPENDENT_AMBULATORY_CARE_PROVIDER_SITE_OTHER): Payer: Medicare HMO | Admitting: Cardiology

## 2020-02-15 VITALS — BP 110/68 | HR 68 | Ht 65.0 in | Wt 168.0 lb

## 2020-02-15 DIAGNOSIS — I10 Essential (primary) hypertension: Secondary | ICD-10-CM

## 2020-02-15 DIAGNOSIS — E782 Mixed hyperlipidemia: Secondary | ICD-10-CM

## 2020-02-15 DIAGNOSIS — I442 Atrioventricular block, complete: Secondary | ICD-10-CM

## 2020-02-15 NOTE — Addendum Note (Signed)
Addended by: Burman Nieves T on: 02/15/2020 03:36 PM   Modules accepted: Orders

## 2020-02-15 NOTE — Progress Notes (Signed)
Virtual Visit via Telephone Note   This visit type was conducted due to national recommendations for restrictions regarding the COVID-19 Pandemic (e.g. social distancing) in an effort to limit this patient's exposure and mitigate transmission in our community.  Due to his co-morbid illnesses, this patient is at least at moderate risk for complications without adequate follow up.  This format is felt to be most appropriate for this patient at this time.  The patient did not have access to video technology/had technical difficulties with video requiring transitioning to audio format only (telephone).  All issues noted in this document were discussed and addressed.  No physical exam could be performed with this format.  Please refer to the patient's chart for his  consent to telehealth for [facility].    Date:  02/15/2020   ID:  Jason Yu, DOB 1950/12/28, MRN [id number] The patient was identified using 2 identifiers.  Patient Location: Home Provider Location: Office/Clinic  PCP:  Suzan Slick, MD  Cardiologist:  Dina Rich, MD  Electrophysiologist:  Hillis Range, MD   Evaluation Performed:  Follow-Up Visit  Chief Complaint:  Follow up visit  History of Present Illness:    Jason Yu is a 69 y.o. male seen today for follow up of the following medical problems.   1. HTN - he called 08/2019 about some elevated bp's. We increased his norvasc to 10mg  daily. - has gained some weight during the pandemic, around 10 to 12 lbs - home bp's 170s/80s last week - home 130s/70s since increasing norvasc.    - he is compliant with meds  2. Complete heart block  - s/p pacemaker placement 2004   - 10/2019 normal device check    3. HL  -compliant with statin, last labs by pcp - no recent labs    SH: has not covid vaccine, not in favor of   The patient does not have symptoms concerning for COVID-19 infection (fever, chills, cough, or new shortness of  breath).    Past Medical History:  Diagnosis Date  . Anxiety   . Asthma    hasn't used inhaler at least 4-5 months.  . Complete heart block [facility])    with prior syncope s/p PPM 2004 in [place] (MDT [place]);  08/16/2011 PPM Gen. Change: MDT Adapta L ADDDR1, 08/18/2011 H.  . Complication of anesthesia 05/2011   had difficulty breathing after surgery=C8 nerve root compression  . DDD (degenerative disc disease)    with multiple prior back surgeries   . GERD (gastroesophageal reflux disease)   . Hepatitis    Hx Hepatitis A & B  . HLD (hyperlipidemia)   . HTN (hypertension), benign   . Pacemaker   . Pneumonia last 1998   has had 8x  . PONV (postoperative nausea and vomiting)   . Shortness of breath   . Syncope    a.  2001 - normal cath;  b. 2012 Normal Myoview, EF 58%.   Past Surgical History:  Procedure Laterality Date  . BACK SURGERY  08/2009, 09/2011  . CARDIAC CATHETERIZATION  2001   no significant CAD  . CARPAL TUNNEL RELEASE  1979  . cervical neck surgeries    . INSERT / REPLACE / REMOVE PACEMAKER  08/2011   initially inserted 12/2002  . pacemaker generator change  08/16/11   Medtronic Adapta L implanted by Dr 08/18/11, lead implangted 2004 in 2005  . PACEMAKER GENERATOR CHANGE Left 08/16/2011   Procedure: PACEMAKER GENERATOR CHANGE;  Surgeon: 08/18/2011,  MD;  Location: [facility] CATH LAB;  Service: Cardiovascular;  Laterality: Left;  . PACEMAKER INSERTION     Implanted 2004 in [place]  . SHOULDER ARTHROSCOPY DISTAL CLAVICLE EXCISION AND OPEN ROTATOR CUFF REPAIR  2009   right  . SHOULDER ARTHROSCOPY DISTAL CLAVICLE EXCISION AND OPEN ROTATOR CUFF REPAIR  2010   Left  . ULNAR TUNNEL RELEASE  1980's(late) or early 1990's     Current Meds  Medication Sig  . amLODipine (NORVASC) 5 MG tablet Take 5 mg by mouth daily.  . Ascorbic Acid (VITAMIN C) 1000 MG tablet Take 1,000 mg by mouth daily.    Marland Kitchen atorvastatin (LIPITOR) 40 MG tablet TAKE 1 TABLET BY MOUTH DAILY  . BREO ELLIPTA 200-25  MCG/INH AEPB inhale one PUFF into lungs daily  . CALCIUM-MAGNESIUM-ZINC PO Take by mouth.  . Cholecalciferol (VITAMIN D) 2000 UNITS CAPS Take 2,000 Units by mouth daily.   Marland Kitchen co-enzyme Q-10 30 MG capsule Take 30 mg by mouth daily.  . diazepam (VALIUM) 10 MG tablet Take 20 mg by mouth daily as needed. For anxiety  . Flaxseed, Linseed, (FLAX SEED OIL) 1000 MG CAPS Take 4,000 mg by mouth daily.  . hydrochlorothiazide (HYDRODIURIL) 25 MG tablet TAKE 1 TABLET BY MOUTH DAILY  . meclizine (ANTIVERT) 12.5 MG tablet Take 12.5 mg by mouth 3 (three) times daily as needed for dizziness.  . Menthol, Topical Analgesic, (BLUE-EMU MAXIMUM STRENGTH) 2.5 % LIQD Apply 1 application topically 3 (three) times daily as needed (for muscle pain).  . Multiple Vitamin (MULTIVITAMIN) tablet Take 1 tablet by mouth daily.    Marland Kitchen omeprazole (PRILOSEC) 20 MG capsule Take 20 mg by mouth 2 (two) times daily as needed (for heartburn or acid reflux).   Marland Kitchen PARoxetine (PAXIL) 20 MG tablet Take 20 mg by mouth daily.   Marland Kitchen umeclidinium bromide (INCRUSE ELLIPTA) 62.5 MCG/INH AEPB Inhale 1 puff into the lungs daily.  . VENTOLIN HFA 108 (90 Base) MCG/ACT inhaler INHALE TWO PUFFS BY MOUTH EVERY 6 HOURS AS NEEDED SHORTNESS OF BREATH  . vitamin B-12 (CYANOCOBALAMIN) 1000 MCG tablet Take 1,000 mcg by mouth daily.  . vitamin E 1000 UNIT capsule Take 1,000 Units by mouth daily.  . Zinc 50 MG CAPS Take by mouth.  . [DISCONTINUED] amLODipine (NORVASC) 10 MG tablet TAKE 1 TABLET BY MOUTH EVERY DAY     Allergies:   Other, Adhesive [tape], Codeine, and Prednisone   Social History   Tobacco Use  . Smoking status: Former Smoker    Packs/day: 2.00    Years: 3.00    Pack years: 6.00    Types: Cigarettes    Start date: 07/11/1968    Quit date: 11/29/1977    Years since quitting: 42.2  . Smokeless tobacco: Never Used  Substance Use Topics  . Alcohol use: No    Alcohol/week: 0.0 standard drinks  . Drug use: Yes    Types: Marijuana    Comment:  smokes occasional marijuana, no ready to quit     Family Hx: The patient's family history is negative for Diabetes, Hypertension, and Coronary artery disease.  ROS:   Please see the history of present illness.     All other systems reviewed and are negative.   Prior CV studies:   The following studies were reviewed today:    Labs/Other Tests and Data Reviewed:    EKG:  No ECG reviewed.  Recent Labs: No results found for requested labs within last 8760 hours.   Recent Lipid Panel No  results found for: CHOL, TRIG, HDL, CHOLHDL, LDLCALC, LDLDIRECT  Wt Readings from Last 3 Encounters:  02/15/20 168 lb (76.2 kg)  08/17/19 170 lb (77.1 kg)  05/07/19 167 lb (75.8 kg)     Objective:    Vital Signs:  BP 110/68   Pulse 68   Ht 5\' 5"  (1.651 m)   Wt 168 lb (76.2 kg)   BMI 27.96 kg/m    Normal affect. Normal speech pattern and tone. Comfortable, no apparent distress. No audible signs of sob or wheezing.   ASSESSMENT & PLAN:    1. HTN - at goal, continue current meds   2. Complete heart block  -no symptoms - normal device check 10/2019 - appears he missed a check in 01/2020, he is to call and arrange.    3. Hyperlipidemia - continue statin, request pcp labs      COVID-19 Education: The signs and symptoms of COVID-19 were discussed with the patient and how to seek care for testing (follow up with PCP or arrange E-visit).  The importance of social distancing was discussed today.  Time:   Today, I have spent 14 minutes with the patient with telehealth technology discussing the above problems.     Medication Adjustments/Labs and Tests Ordered: Current medicines are reviewed at length with the patient today.  Concerns regarding medicines are outlined above.   Tests Ordered: No orders of the defined types were placed in this encounter.   Medication Changes: No orders of the defined types were placed in this encounter.   Follow Up:  In Person in 6  month(s)  Signed, 03/2020, MD  02/15/2020 11:46 AM    [facility]

## 2020-02-15 NOTE — Telephone Encounter (Signed)
  Patient Consent for Virtual Visit         Jason Yu has provided verbal consent on 02/15/2020 for a virtual visit (video or telephone).   CONSENT FOR VIRTUAL VISIT FOR:  Jason Yu  By participating in this virtual visit I agree to the following:  I hereby voluntarily request, consent and authorize [facility] and its employed or contracted physicians, physician assistants, nurse practitioners or other licensed health care professionals (the Practitioner), to provide me with telemedicine health care services (the "Services") as deemed necessary by the treating Practitioner. I acknowledge and consent to receive the Services by the Practitioner via telemedicine. I understand that the telemedicine visit will involve communicating with the Practitioner through live audiovisual communication technology and the disclosure of certain medical information by electronic transmission. I acknowledge that I have been given the opportunity to request an in-person assessment or other available alternative prior to the telemedicine visit and am voluntarily participating in the telemedicine visit.  I understand that I have the right to withhold or withdraw my consent to the use of telemedicine in the course of my care at any time, without affecting my right to future care or treatment, and that the Practitioner or I may terminate the telemedicine visit at any time. I understand that I have the right to inspect all information obtained and/or recorded in the course of the telemedicine visit and may receive copies of available information for a reasonable fee.  I understand that some of the potential risks of receiving the Services via telemedicine include:  Marland Kitchen Delay or interruption in medical evaluation due to technological equipment failure or disruption; . Information transmitted may not be sufficient (e.g. poor resolution of images) to allow for appropriate medical decision making by the Practitioner;  and/or  . In rare instances, security protocols could fail, causing a breach of personal health information.  Furthermore, I acknowledge that it is my responsibility to provide information about my medical history, conditions and care that is complete and accurate to the best of my ability. I acknowledge that Practitioner's advice, recommendations, and/or decision may be based on factors not within their control, such as incomplete or inaccurate data provided by me or distortions of diagnostic images or specimens that may result from electronic transmissions. I understand that the practice of medicine is not an exact science and that Practitioner makes no warranties or guarantees regarding treatment outcomes. I acknowledge that a copy of this consent can be made available to me via my patient portal [facility] MyChart), or I can request a printed copy by calling the office of [facility].    I understand that my insurance will be billed for this visit.   I have read or had this consent read to me. . I understand the contents of this consent, which adequately explains the benefits and risks of the Services being provided via telemedicine.  . I have been provided ample opportunity to ask questions regarding this consent and the Services and have had my questions answered to my satisfaction. . I give my informed consent for the services to be provided through the use of telemedicine in my medical care

## 2020-02-15 NOTE — Patient Instructions (Signed)
Your physician wants you to follow-up in: 6 MONTHS WITH DR [facility] You will receive a reminder letter in the mail two months in advance. If you don't receive a letter, please call our office to schedule the follow-up appointment.  Your physician recommends that you continue on your current medications as directed. Please refer to the Current Medication list given to you today.  Your physician recommends that you return for lab work in: CBC/MG/TSH/LIPIDS/MG/HGBA1C - PLEASE FAST 6-8 HOURS PRIOR TO LAB WORK   Thank you for choosing [facility]!!

## 2020-03-19 ENCOUNTER — Other Ambulatory Visit: Payer: Self-pay | Admitting: Cardiology

## 2020-04-14 ENCOUNTER — Other Ambulatory Visit: Payer: Self-pay | Admitting: Pulmonary Disease

## 2020-05-02 ENCOUNTER — Telehealth: Payer: Self-pay | Admitting: Internal Medicine

## 2020-05-02 NOTE — Telephone Encounter (Signed)
  Patient Consent for Virtual Visit         Jason Yu has provided verbal consent on 05/02/2020 for a virtual visit (video or telephone).   CONSENT FOR VIRTUAL VISIT FOR:  Jason Yu  By participating in this virtual visit I agree to the following:  I hereby voluntarily request, consent and authorize [facility] and its employed or contracted physicians, physician assistants, nurse practitioners or other licensed health care professionals (the Practitioner), to provide me with telemedicine health care services (the "Services") as deemed necessary by the treating Practitioner. I acknowledge and consent to receive the Services by the Practitioner via telemedicine. I understand that the telemedicine visit will involve communicating with the Practitioner through live audiovisual communication technology and the disclosure of certain medical information by electronic transmission. I acknowledge that I have been given the opportunity to request an in-person assessment or other available alternative prior to the telemedicine visit and am voluntarily participating in the telemedicine visit.  I understand that I have the right to withhold or withdraw my consent to the use of telemedicine in the course of my care at any time, without affecting my right to future care or treatment, and that the Practitioner or I may terminate the telemedicine visit at any time. I understand that I have the right to inspect all information obtained and/or recorded in the course of the telemedicine visit and may receive copies of available information for a reasonable fee.  I understand that some of the potential risks of receiving the Services via telemedicine include:  Marland Kitchen Delay or interruption in medical evaluation due to technological equipment failure or disruption; . Information transmitted may not be sufficient (e.g. poor resolution of images) to allow for appropriate medical decision making by the Practitioner;  and/or  . In rare instances, security protocols could fail, causing a breach of personal health information.  Furthermore, I acknowledge that it is my responsibility to provide information about my medical history, conditions and care that is complete and accurate to the best of my ability. I acknowledge that Practitioner's advice, recommendations, and/or decision may be based on factors not within their control, such as incomplete or inaccurate data provided by me or distortions of diagnostic images or specimens that may result from electronic transmissions. I understand that the practice of medicine is not an exact science and that Practitioner makes no warranties or guarantees regarding treatment outcomes. I acknowledge that a copy of this consent can be made available to me via my patient portal [facility] MyChart), or I can request a printed copy by calling the office of [facility].    I understand that my insurance will be billed for this visit.   I have read or had this consent read to me. . I understand the contents of this consent, which adequately explains the benefits and risks of the Services being provided via telemedicine.  . I have been provided ample opportunity to ask questions regarding this consent and the Services and have had my questions answered to my satisfaction. . I give my informed consent for the services to be provided through the use of telemedicine in my medical care

## 2020-05-05 ENCOUNTER — Encounter: Payer: Self-pay | Admitting: Internal Medicine

## 2020-05-05 ENCOUNTER — Telehealth (INDEPENDENT_AMBULATORY_CARE_PROVIDER_SITE_OTHER): Payer: Medicare HMO | Admitting: Internal Medicine

## 2020-05-05 ENCOUNTER — Ambulatory Visit (INDEPENDENT_AMBULATORY_CARE_PROVIDER_SITE_OTHER): Payer: Medicare HMO

## 2020-05-05 VITALS — BP 144/74 | HR 65 | Ht 65.0 in | Wt 175.0 lb

## 2020-05-05 DIAGNOSIS — I442 Atrioventricular block, complete: Secondary | ICD-10-CM

## 2020-05-05 DIAGNOSIS — I10 Essential (primary) hypertension: Secondary | ICD-10-CM | POA: Diagnosis not present

## 2020-05-05 DIAGNOSIS — E782 Mixed hyperlipidemia: Secondary | ICD-10-CM | POA: Diagnosis not present

## 2020-05-05 NOTE — Progress Notes (Signed)
Electrophysiology TeleHealth Note  Due to national recommendations of social distancing due to COVID 19, an audio telehealth visit is felt to be most appropriate for this patient at this time.  Verbal consent was obtained by me for the telehealth visit today.  The patient does not have capability for a virtual visit.  A phone visit is therefore required today.   Date:  05/05/2020   ID:  Jason Yu, DOB [birth year], MRN [id number]  Location: patient's home  Provider location:  [place]  Evaluation Performed: Follow-up visit  PCP:  Suzan Slick, MD   Electrophysiologist:  Dr Jason Yu  Chief Complaint:  palpitations  History of Present Illness:    Jason Yu is a 69 y.o. male who presents via telehealth conferencing today.  Since last being seen in our clinic, the patient reports doing very well.  Today, he denies symptoms of palpitations, chest pain,  lower extremity edema, dizziness, presyncope, or syncope.  He has chronic SOB due to COPD.   The patient is otherwise without complaint today.     Past Medical History:  Diagnosis Date  . Anxiety   . Asthma    hasn't used inhaler at least 4-5 months.  . Complete heart block [facility])    with prior syncope s/p PPM 2004 in [place] (MDT Kappa);  08/16/2011 PPM Gen. Change: MDT Adapta L ADDDR1, [id number] H.  . Complication of anesthesia 05/2011   had difficulty breathing after surgery=C8 nerve root compression  . DDD (degenerative disc disease)    with multiple prior back surgeries   . GERD (gastroesophageal reflux disease)   . Hepatitis    Hx Hepatitis A & B  . HLD (hyperlipidemia)   . HTN (hypertension), benign   . Pacemaker   . Pneumonia last 1998   has had 8x  . PONV (postoperative nausea and vomiting)   . Shortness of breath   . Syncope    a.  2001 - normal cath;  b. 2012 Normal Myoview, EF 58%.    Past Surgical History:  Procedure Laterality Date  . BACK SURGERY  08/2009, 09/2011  . CARDIAC  CATHETERIZATION  2001   no significant CAD  . CARPAL TUNNEL RELEASE  1979  . cervical neck surgeries    . INSERT / REPLACE / REMOVE PACEMAKER  08/2011   initially inserted 12/2002  . pacemaker generator change  08/16/11   Medtronic Adapta L implanted by Dr Jason Yu, lead implangted 2004 in [place]  . PACEMAKER GENERATOR CHANGE Left 08/16/2011   Procedure: PACEMAKER GENERATOR CHANGE;  Surgeon: Hillis Range, MD;  Location: [facility] CATH LAB;  Service: Cardiovascular;  Laterality: Left;  . PACEMAKER INSERTION     Implanted 2004 in [place]  . SHOULDER ARTHROSCOPY DISTAL CLAVICLE EXCISION AND OPEN ROTATOR CUFF REPAIR  2009   right  . SHOULDER ARTHROSCOPY DISTAL CLAVICLE EXCISION AND OPEN ROTATOR CUFF REPAIR  2010   Left  . ULNAR TUNNEL RELEASE  1980's(late) or early 1990's    Current Outpatient Medications  Medication Sig Dispense Refill  . amLODipine (NORVASC) 10 MG tablet Take 1 tablet by mouth daily.    . Ascorbic Acid (VITAMIN C) 1000 MG tablet Take 1,000 mg by mouth daily.      Marland Kitchen atorvastatin (LIPITOR) 40 MG tablet TAKE 1 TABLET BY MOUTH DAILY 90 tablet 1  . BREO ELLIPTA 200-25 MCG/INH AEPB inhale one PUFF into lungs daily 60 each 3  . CALCIUM-MAGNESIUM-ZINC PO Take by mouth.    . Cholecalciferol (  VITAMIN D) 2000 UNITS CAPS Take 2,000 Units by mouth daily.     Marland Kitchen co-enzyme Q-10 30 MG capsule Take 30 mg by mouth daily.    . diazepam (VALIUM) 10 MG tablet Take 20 mg by mouth daily as needed. For anxiety    . Flaxseed, Linseed, (FLAX SEED OIL) 1000 MG CAPS Take 4,000 mg by mouth daily.    . hydrochlorothiazide (HYDRODIURIL) 25 MG tablet TAKE 1 TABLET BY MOUTH DAILY 90 tablet 1  . meclizine (ANTIVERT) 12.5 MG tablet Take 12.5 mg by mouth 3 (three) times daily as needed for dizziness.    . Menthol, Topical Analgesic, (BLUE-EMU MAXIMUM STRENGTH) 2.5 % LIQD Apply 1 application topically 3 (three) times daily as needed (for muscle pain).    . Multiple Vitamin (MULTIVITAMIN) tablet Take 1 tablet by mouth  daily.      Marland Kitchen omeprazole (PRILOSEC) 20 MG capsule Take 20 mg by mouth 2 (two) times daily as needed (for heartburn or acid reflux).     Marland Kitchen PARoxetine (PAXIL) 20 MG tablet Take 20 mg by mouth daily.     Marland Kitchen SPIRIVA HANDIHALER 18 MCG inhalation capsule Place 1 capsule into inhaler and inhale daily.    Marland Kitchen umeclidinium bromide (INCRUSE ELLIPTA) 62.5 MCG/INH AEPB Inhale 1 puff into the lungs daily.    . VENTOLIN HFA 108 (90 Base) MCG/ACT inhaler INHALE TWO PUFFS BY MOUTH EVERY 6 HOURS AS NEEDED SHORTNESS OF BREATH 18 g 0  . vitamin B-12 (CYANOCOBALAMIN) 1000 MCG tablet Take 1,000 mcg by mouth daily.    . vitamin E 1000 UNIT capsule Take 1,000 Units by mouth daily.    . Zinc 50 MG CAPS Take by mouth.     No current facility-administered medications for this visit.    Allergies:   Other, Adhesive [tape], Codeine, and Prednisone   Social History:  The patient  reports that he quit smoking about 42 years ago. His smoking use included cigarettes. He started smoking about 51 years ago. He has a 6.00 pack-year smoking history. He has never used smokeless tobacco. He reports current drug use. Drug: Marijuana. He reports that he does not drink alcohol.   ROS:  Please see the history of present illness.   All other systems are personally reviewed and negative.    Exam:    Vital Signs:  BP (!) 144/74   Pulse 65   Ht 5\' 5"  (1.651 m)   Wt 175 lb (79.4 kg)   BMI 29.12 kg/m   Well sounding, alert and conversant   Labs/Other Tests and Data Reviewed:    Recent Labs: No results found for requested labs within last 8760 hours.   Wt Readings from Last 3 Encounters:  05/05/20 175 lb (79.4 kg)  02/15/20 168 lb (76.2 kg)  08/17/19 170 lb (77.1 kg)     Last device remote is reviewed from 5/21 (PaceART PDF) which reveals normal device function, no arrhythmias    ASSESSMENT & PLAN:    1.  Complete heart block Remotes are overdue Remote from 10/2019 is reviewed  2. HTN Stable No change required  today  3. HL Continue lipitor  Risks, benefits and potential toxicities for medications prescribed and/or refilled reviewed with patient today.   Follow-up:  with me in a year   Patient Risk:  after full review of this patients clinical status, I feel that they are at moderate risk at this time.  Today, I have spent 15 minutes with the patient with telehealth technology discussing  arrhythmia management .    Randolm Idol, MD  05/05/2020 10:23 AM     [facility] [address] [place] [address] [phone] (office) [phone] (fax)

## 2020-05-05 NOTE — Patient Instructions (Signed)
Medication Instructions:   Your physician recommends that you continue on your current medications as directed. Please refer to the Current Medication list given to you today.  Labwork:  None  Testing/Procedures:  None  Follow-Up:  Your physician recommends that you schedule a follow-up appointment in: 1 year with Dr. Allred.  Any Other Special Instructions Will Be Listed Below (If Applicable).  If you need a refill on your cardiac medications before your next appointment, please call your pharmacy. 

## 2020-05-06 LAB — CUP PACEART REMOTE DEVICE CHECK
Battery Impedance: 1429 Ohm
Battery Remaining Longevity: 42 mo
Battery Voltage: 2.77 V
Brady Statistic AP VP Percent: 20 %
Brady Statistic AP VS Percent: 0 %
Brady Statistic AS VP Percent: 80 %
Brady Statistic AS VS Percent: 0 %
Date Time Interrogation Session: 20211105163401
Implantable Lead Implant Date: 20040709
Implantable Lead Implant Date: 20040709
Implantable Lead Location: 753859
Implantable Lead Location: 753860
Implantable Lead Model: 5076
Implantable Lead Model: 5076
Implantable Pulse Generator Implant Date: 20130215
Lead Channel Impedance Value: 394 Ohm
Lead Channel Impedance Value: 562 Ohm
Lead Channel Pacing Threshold Amplitude: 0.625 V
Lead Channel Pacing Threshold Amplitude: 1.375 V
Lead Channel Pacing Threshold Pulse Width: 0.4 ms
Lead Channel Pacing Threshold Pulse Width: 0.4 ms
Lead Channel Setting Pacing Amplitude: 2 V
Lead Channel Setting Pacing Amplitude: 2.75 V
Lead Channel Setting Pacing Pulse Width: 0.4 ms
Lead Channel Setting Sensing Sensitivity: 5.6 mV

## 2020-05-08 NOTE — Progress Notes (Signed)
Remote pacemaker transmission.   

## 2020-08-07 ENCOUNTER — Ambulatory Visit (INDEPENDENT_AMBULATORY_CARE_PROVIDER_SITE_OTHER): Payer: Medicare HMO

## 2020-08-07 DIAGNOSIS — I442 Atrioventricular block, complete: Secondary | ICD-10-CM

## 2020-08-09 LAB — CUP PACEART REMOTE DEVICE CHECK
Battery Impedance: 1514 Ohm
Battery Remaining Longevity: 39 mo
Battery Voltage: 2.77 V
Brady Statistic AP VP Percent: 20 %
Brady Statistic AP VS Percent: 0 %
Brady Statistic AS VP Percent: 80 %
Brady Statistic AS VS Percent: 0 %
Date Time Interrogation Session: 20220208160208
Implantable Lead Implant Date: 20040709
Implantable Lead Implant Date: 20040709
Implantable Lead Location: 753859
Implantable Lead Location: 753860
Implantable Lead Model: 5076
Implantable Lead Model: 5076
Implantable Pulse Generator Implant Date: 20130215
Lead Channel Impedance Value: 405 Ohm
Lead Channel Impedance Value: 548 Ohm
Lead Channel Pacing Threshold Amplitude: 0.625 V
Lead Channel Pacing Threshold Amplitude: 1.375 V
Lead Channel Pacing Threshold Pulse Width: 0.4 ms
Lead Channel Pacing Threshold Pulse Width: 0.4 ms
Lead Channel Setting Pacing Amplitude: 2 V
Lead Channel Setting Pacing Amplitude: 2.75 V
Lead Channel Setting Pacing Pulse Width: 0.4 ms
Lead Channel Setting Sensing Sensitivity: 5.6 mV

## 2020-08-11 NOTE — Progress Notes (Signed)
Remote pacemaker transmission.   

## 2020-08-14 ENCOUNTER — Other Ambulatory Visit: Payer: Self-pay | Admitting: Cardiology

## 2020-08-14 ENCOUNTER — Other Ambulatory Visit: Payer: Self-pay | Admitting: Pulmonary Disease

## 2020-08-16 ENCOUNTER — Telehealth: Payer: Self-pay | Admitting: Cardiology

## 2020-08-16 NOTE — Telephone Encounter (Signed)
  Patient Consent for Virtual Visit         Jason Yu has provided verbal consent on 08/16/2020 for a virtual visit (video or telephone).   CONSENT FOR VIRTUAL VISIT FOR:  Jason Yu  By participating in this virtual visit I agree to the following:  I hereby voluntarily request, consent and authorize [facility] and its employed or contracted physicians, physician assistants, nurse practitioners or other licensed health care professionals (the Practitioner), to provide me with telemedicine health care services (the "Services") as deemed necessary by the treating Practitioner. I acknowledge and consent to receive the Services by the Practitioner via telemedicine. I understand that the telemedicine visit will involve communicating with the Practitioner through live audiovisual communication technology and the disclosure of certain medical information by electronic transmission. I acknowledge that I have been given the opportunity to request an in-person assessment or other available alternative prior to the telemedicine visit and am voluntarily participating in the telemedicine visit.  I understand that I have the right to withhold or withdraw my consent to the use of telemedicine in the course of my care at any time, without affecting my right to future care or treatment, and that the Practitioner or I may terminate the telemedicine visit at any time. I understand that I have the right to inspect all information obtained and/or recorded in the course of the telemedicine visit and may receive copies of available information for a reasonable fee.  I understand that some of the potential risks of receiving the Services via telemedicine include:  Marland Kitchen Delay or interruption in medical evaluation due to technological equipment failure or disruption; . Information transmitted may not be sufficient (e.g. poor resolution of images) to allow for appropriate medical decision making by the Practitioner;  and/or  . In rare instances, security protocols could fail, causing a breach of personal health information.  Furthermore, I acknowledge that it is my responsibility to provide information about my medical history, conditions and care that is complete and accurate to the best of my ability. I acknowledge that Practitioner's advice, recommendations, and/or decision may be based on factors not within their control, such as incomplete or inaccurate data provided by me or distortions of diagnostic images or specimens that may result from electronic transmissions. I understand that the practice of medicine is not an exact science and that Practitioner makes no warranties or guarantees regarding treatment outcomes. I acknowledge that a copy of this consent can be made available to me via my patient portal [facility] MyChart), or I can request a printed copy by calling the office of [facility].    I understand that my insurance will be billed for this visit.   I have read or had this consent read to me. . I understand the contents of this consent, which adequately explains the benefits and risks of the Services being provided via telemedicine.  . I have been provided ample opportunity to ask questions regarding this consent and the Services and have had my questions answered to my satisfaction. . I give my informed consent for the services to be provided through the use of telemedicine in my medical care

## 2020-08-18 ENCOUNTER — Encounter: Payer: Self-pay | Admitting: Cardiology

## 2020-08-18 ENCOUNTER — Telehealth (INDEPENDENT_AMBULATORY_CARE_PROVIDER_SITE_OTHER): Payer: Medicare HMO | Admitting: Cardiology

## 2020-08-18 VITALS — BP 135/77 | HR 66 | Ht 65.0 in | Wt 170.0 lb

## 2020-08-18 DIAGNOSIS — I442 Atrioventricular block, complete: Secondary | ICD-10-CM | POA: Diagnosis not present

## 2020-08-18 DIAGNOSIS — I10 Essential (primary) hypertension: Secondary | ICD-10-CM | POA: Diagnosis not present

## 2020-08-18 DIAGNOSIS — E782 Mixed hyperlipidemia: Secondary | ICD-10-CM | POA: Diagnosis not present

## 2020-08-18 NOTE — Progress Notes (Signed)
Virtual Visit via Telephone Note   This visit type was conducted due to national recommendations for restrictions regarding the COVID-19 Pandemic (e.g. social distancing) in an effort to limit this patient's exposure and mitigate transmission in our community.  Due to his co-morbid illnesses, this patient is at least at moderate risk for complications without adequate follow up.  This format is felt to be most appropriate for this patient at this time.  The patient did not have access to video technology/had technical difficulties with video requiring transitioning to audio format only (telephone).  All issues noted in this document were discussed and addressed.  No physical exam could be performed with this format.  Please refer to the patient's chart for his  consent to telehealth for [facility].    Date:  08/18/2020   ID:  Jason Yu, DOB 29-Apr-1951, MRN [id number] The patient was identified using 2 identifiers.  Patient Location: Home Provider Location: Office/Clinic   PCP:  Suzan Slick, MD   [facility]  Cardiologist:  Dina Rich, MD  Advanced Practice Provider:  No care team member to display Electrophysiologist:  Hillis Range, MD   :[phone]}   Evaluation Performed:  Follow-Up Visit  Chief Complaint:  Follow up visit  History of Present Illness:    Jason Yu is a 70 y.o. male seen today for follow up of the following medical problems.   1. HTN - he called 08/2019 about some elevated bp's. We increased his norvasc to 10mg  daily. - has gained some weight during the pandemic, around 10 to 12 lbs   114/65 on repeat bp today - compliant with meds  2. Complete heart block  - s/p pacemaker placement 2004   - 08/2020 normal device check - no recent symptoms.     3. HL  -labs followed by pcp - he is on statin    SH: has not covid vaccine, not in favor of    The patient does not have symptoms  concerning for COVID-19 infection (fever, chills, cough, or new shortness of breath).    Past Medical History:  Diagnosis Date  . Anxiety   . Asthma    hasn't used inhaler at least 4-5 months.  . Complete heart block [facility])    with prior syncope s/p PPM 2004 in 2005 (MDT Kappa);  08/16/2011 PPM Gen. Change: MDT Adapta L ADDDR1, 08/18/2011 H.  . Complication of anesthesia 05/2011   had difficulty breathing after surgery=C8 nerve root compression  . DDD (degenerative disc disease)    with multiple prior back surgeries   . GERD (gastroesophageal reflux disease)   . Hepatitis    Hx Hepatitis A & B  . HLD (hyperlipidemia)   . HTN (hypertension), benign   . Pacemaker   . Pneumonia last 1998   has had 8x  . PONV (postoperative nausea and vomiting)   . Shortness of breath   . Syncope    a.  2001 - normal cath;  b. 2012 Normal Myoview, EF 58%.   Past Surgical History:  Procedure Laterality Date  . BACK SURGERY  08/2009, 09/2011  . CARDIAC CATHETERIZATION  2001   no significant CAD  . CARPAL TUNNEL RELEASE  1979  . cervical neck surgeries    . INSERT / REPLACE / REMOVE PACEMAKER  08/2011   initially inserted 12/2002  . pacemaker generator change  08/16/11   Medtronic Adapta L implanted by Dr 08/18/11, lead implangted 2004 in 2005  .  PACEMAKER GENERATOR CHANGE Left 08/16/2011   Procedure: PACEMAKER GENERATOR CHANGE;  Surgeon: Hillis Range, MD;  Location: [facility] CATH LAB;  Service: Cardiovascular;  Laterality: Left;  . PACEMAKER INSERTION     Implanted 2004 in [place]  . SHOULDER ARTHROSCOPY DISTAL CLAVICLE EXCISION AND OPEN ROTATOR CUFF REPAIR  2009   right  . SHOULDER ARTHROSCOPY DISTAL CLAVICLE EXCISION AND OPEN ROTATOR CUFF REPAIR  2010   Left  . ULNAR TUNNEL RELEASE  1980's(late) or early 1990's     Current Meds  Medication Sig  . amLODipine (NORVASC) 10 MG tablet TAKE 1 TABLET BY MOUTH EVERY DAY (HALF TAB IN THE MORNING AND HALF TAB IN THE EVENING)  . Ascorbic Acid (VITAMIN C)  1000 MG tablet Take 1,000 mg by mouth daily.    Marland Kitchen atorvastatin (LIPITOR) 40 MG tablet TAKE 1 TABLET BY MOUTH DAILY  . BREO ELLIPTA 200-25 MCG/INH AEPB inhale one PUFF into lungs daily  . Cholecalciferol (VITAMIN D) 2000 UNITS CAPS Take 2,000 Units by mouth daily.   Marland Kitchen co-enzyme Q-10 30 MG capsule Take 30 mg by mouth daily.  . diazepam (VALIUM) 10 MG tablet Take 20 mg by mouth daily as needed. For anxiety  . Flaxseed, Linseed, (FLAX SEED OIL) 1000 MG CAPS Take 4,000 mg by mouth daily.  . hydrochlorothiazide (HYDRODIURIL) 25 MG tablet TAKE 1 TABLET BY MOUTH DAILY  . meclizine (ANTIVERT) 12.5 MG tablet Take 12.5 mg by mouth 3 (three) times daily as needed for dizziness.  . Menthol, Topical Analgesic, 2.5 % LIQD Apply 1 application topically 3 (three) times daily as needed (for muscle pain).  . Multiple Vitamin (MULTIVITAMIN) tablet Take 1 tablet by mouth daily.  Marland Kitchen omeprazole (PRILOSEC) 20 MG capsule Take 20 mg by mouth 2 (two) times daily as needed (for heartburn or acid reflux).   Marland Kitchen PARoxetine (PAXIL) 20 MG tablet Take 20 mg by mouth daily.   Marland Kitchen SPIRIVA HANDIHALER 18 MCG inhalation capsule Place 1 capsule into inhaler and inhale daily.  . VENTOLIN HFA 108 (90 Base) MCG/ACT inhaler INHALE TWO PUFFS BY MOUTH EVERY 6 HOURS AS NEEDED SHORTNESS OF BREATH  . vitamin B-12 (CYANOCOBALAMIN) 1000 MCG tablet Take 1,000 mcg by mouth daily.  . vitamin E 1000 UNIT capsule Take 1,000 Units by mouth daily.  . Zinc 50 MG CAPS Take by mouth.     Allergies:   Other, Adhesive [tape], Codeine, and Prednisone   Social History   Tobacco Use  . Smoking status: Former Smoker    Packs/day: 2.00    Years: 3.00    Pack years: 6.00    Types: Cigarettes    Start date: 07/11/1968    Quit date: 11/29/1977    Years since quitting: 42.7  . Smokeless tobacco: Never Used  Substance Use Topics  . Alcohol use: No    Alcohol/week: 0.0 standard drinks  . Drug use: Yes    Types: Marijuana    Comment: smokes occasional  marijuana, no ready to quit     Family Hx: The patient's family history is negative for Diabetes, Hypertension, and Coronary artery disease.  ROS:   Please see the history of present illness.     All other systems reviewed and are negative.   Prior CV studies:   The following studies were reviewed today:    Labs/Other Tests and Data Reviewed:    EKG:  No ECG reviewed.  Recent Labs: No results found for requested labs within last 8760 hours.   Recent Lipid Panel No  results found for: CHOL, TRIG, HDL, CHOLHDL, LDLCALC, LDLDIRECT  Wt Readings from Last 3 Encounters:  08/18/20 170 lb (77.1 kg)  05/05/20 175 lb (79.4 kg)  02/15/20 168 lb (76.2 kg)     Risk Assessment/Calculations:      Objective:    Vital Signs:  BP 135/77   Pulse 66   Ht 5\' 5"  (1.651 m)   Wt 170 lb (77.1 kg)   SpO2 94%   BMI 28.29 kg/m    Normal affect. Normal speech pattern and tone. Comfortable, no apparent distress. No audible signs of sob or wheezing.   ASSESSMENT & PLAN:    1. HTN - bp at goal, continue current meds   2. Complete heart block  -pacemaker followed by EP, normal recent check - continue to monitor.    3. Hyperlipidemia - labs per pcp, continue statin        COVID-19 Education: The signs and symptoms of COVID-19 were discussed with the patient and how to seek care for testing (follow up with PCP or arrange E-visit).  The importance of social distancing was discussed today.  Time:   Today, I have spent 16 minutes with the patient with telehealth technology discussing the above problems.     Medication Adjustments/Labs and Tests Ordered: Current medicines are reviewed at length with the patient today.  Concerns regarding medicines are outlined above.   Tests Ordered: No orders of the defined types were placed in this encounter.   Medication Changes: No orders of the defined types were placed in this encounter.   Follow Up:  In Person in 6  month(s)  Signed, , MD  08/18/2020 2:24 PM    [facility]

## 2020-08-18 NOTE — Patient Instructions (Addendum)

## 2020-08-23 ENCOUNTER — Other Ambulatory Visit: Payer: Self-pay | Admitting: Pulmonary Disease

## 2020-09-12 ENCOUNTER — Other Ambulatory Visit: Payer: Self-pay | Admitting: Cardiology

## 2020-11-06 ENCOUNTER — Ambulatory Visit (INDEPENDENT_AMBULATORY_CARE_PROVIDER_SITE_OTHER): Payer: Medicare HMO

## 2020-11-06 DIAGNOSIS — I442 Atrioventricular block, complete: Secondary | ICD-10-CM

## 2020-11-07 ENCOUNTER — Telehealth: Payer: Self-pay | Admitting: Internal Medicine

## 2020-11-07 NOTE — Telephone Encounter (Signed)
Jason Yu called in regards to his remote call ins. Please give patient a call.

## 2020-11-07 NOTE — Telephone Encounter (Signed)
LMOVM with my direct office number. 

## 2020-11-10 LAB — CUP PACEART REMOTE DEVICE CHECK
Battery Impedance: 1600 Ohm
Battery Remaining Longevity: 38 mo
Battery Voltage: 2.76 V
Brady Statistic AP VP Percent: 20 %
Brady Statistic AP VS Percent: 0 %
Brady Statistic AS VP Percent: 80 %
Brady Statistic AS VS Percent: 0 %
Date Time Interrogation Session: 20220512150238
Implantable Lead Implant Date: 20040709
Implantable Lead Implant Date: 20040709
Implantable Lead Location: 753859
Implantable Lead Location: 753860
Implantable Lead Model: 5076
Implantable Lead Model: 5076
Implantable Pulse Generator Implant Date: 20130215
Lead Channel Impedance Value: 410 Ohm
Lead Channel Impedance Value: 559 Ohm
Lead Channel Pacing Threshold Amplitude: 0.625 V
Lead Channel Pacing Threshold Amplitude: 1.375 V
Lead Channel Pacing Threshold Pulse Width: 0.4 ms
Lead Channel Pacing Threshold Pulse Width: 0.4 ms
Lead Channel Setting Pacing Amplitude: 2 V
Lead Channel Setting Pacing Amplitude: 2.75 V
Lead Channel Setting Pacing Pulse Width: 0.4 ms
Lead Channel Setting Sensing Sensitivity: 5.6 mV

## 2020-11-14 NOTE — Telephone Encounter (Signed)
The patient states he was calling because his monitor is running off 3g but [employer]tr[employer] sent him a 4g device. He is all set. He transmitted on 11-09-2020.

## 2020-11-24 NOTE — Progress Notes (Signed)
Remote pacemaker transmission.   

## 2020-12-20 ENCOUNTER — Other Ambulatory Visit: Payer: Self-pay | Admitting: Cardiology

## 2021-02-28 ENCOUNTER — Ambulatory Visit: Payer: Medicare HMO | Admitting: Cardiology

## 2021-04-13 ENCOUNTER — Telehealth: Payer: Self-pay | Admitting: Cardiology

## 2021-04-13 ENCOUNTER — Encounter: Payer: Self-pay | Admitting: Cardiology

## 2021-04-13 ENCOUNTER — Other Ambulatory Visit: Payer: Self-pay

## 2021-04-13 ENCOUNTER — Ambulatory Visit (INDEPENDENT_AMBULATORY_CARE_PROVIDER_SITE_OTHER): Payer: Medicare HMO | Admitting: Cardiology

## 2021-04-13 VITALS — BP 153/84 | HR 63 | Ht 64.0 in | Wt 165.0 lb

## 2021-04-13 DIAGNOSIS — I442 Atrioventricular block, complete: Secondary | ICD-10-CM

## 2021-04-13 DIAGNOSIS — I10 Essential (primary) hypertension: Secondary | ICD-10-CM | POA: Diagnosis not present

## 2021-04-13 DIAGNOSIS — E782 Mixed hyperlipidemia: Secondary | ICD-10-CM

## 2021-04-13 NOTE — Telephone Encounter (Signed)
  Patient Consent for Virtual Visit        Jason Yu has provided verbal consent on 04/13/2021 for a virtual visit (video or telephone).   CONSENT FOR VIRTUAL VISIT FOR:  Jason Yu  By participating in this virtual visit I agree to the following:  I hereby voluntarily request, consent and authorize [facility] and its employed or contracted physicians, physician assistants, nurse practitioners or other licensed health care professionals (the Practitioner), to provide me with telemedicine health care services (the "Services") as deemed necessary by the treating Practitioner. I acknowledge and consent to receive the Services by the Practitioner via telemedicine. I understand that the telemedicine visit will involve communicating with the Practitioner through live audiovisual communication technology and the disclosure of certain medical information by electronic transmission. I acknowledge that I have been given the opportunity to request an in-person assessment or other available alternative prior to the telemedicine visit and am voluntarily participating in the telemedicine visit.  I understand that I have the right to withhold or withdraw my consent to the use of telemedicine in the course of my care at any time, without affecting my right to future care or treatment, and that the Practitioner or I may terminate the telemedicine visit at any time. I understand that I have the right to inspect all information obtained and/or recorded in the course of the telemedicine visit and may receive copies of available information for a reasonable fee.  I understand that some of the potential risks of receiving the Services via telemedicine include:  Delay or interruption in medical evaluation due to technological equipment failure or disruption; Information transmitted may not be sufficient (e.g. poor resolution of images) to allow for appropriate medical decision making by the Practitioner;  and/or  In rare instances, security protocols could fail, causing a breach of personal health information.  Furthermore, I acknowledge that it is my responsibility to provide information about my medical history, conditions and care that is complete and accurate to the best of my ability. I acknowledge that Practitioner's advice, recommendations, and/or decision may be based on factors not within their control, such as incomplete or inaccurate data provided by me or distortions of diagnostic images or specimens that may result from electronic transmissions. I understand that the practice of medicine is not an exact science and that Practitioner makes no warranties or guarantees regarding treatment outcomes. I acknowledge that a copy of this consent can be made available to me via my patient portal [facility] MyChart), or I can request a printed copy by calling the office of [facility].    I understand that my insurance will be billed for this visit.   I have read or had this consent read to me. I understand the contents of this consent, which adequately explains the benefits and risks of the Services being provided via telemedicine.  I have been provided ample opportunity to ask questions regarding this consent and the Services and have had my questions answered to my satisfaction. I give my informed consent for the services to be provided through the use of telemedicine in my medical care

## 2021-04-13 NOTE — Patient Instructions (Signed)
Medication Instructions:  Your physician recommends that you continue on your current medications as directed. Please refer to the Current Medication list given to you today.  *If you need a refill on your cardiac medications before your next appointment, please call your pharmacy*   Lab Work: None If you have labs (blood work) drawn today and your tests are completely normal, you will receive your results only by: MyChart Message (if you have MyChart) OR A paper copy in the mail If you have any lab test that is abnormal or we need to change your treatment, we will call you to review the results.   Testing/Procedures: None   Follow-Up: At [facility], you and your health needs are our priority.  As part of our continuing mission to provide you with exceptional heart care, we have created designated Provider Care Teams.  These Care Teams include your primary Cardiologist (physician) and Advanced Practice Providers (APPs -  Physician Assistants and Nurse Practitioners) who all work together to provide you with the care you need, when you need it.  We recommend signing up for the patient portal called "MyChart".  Sign up information is provided on this After Visit Summary.  MyChart is used to connect with patients for Virtual Visits (Telemedicine).  Patients are able to view lab/test results, encounter notes, upcoming appointments, etc.  Non-urgent messages can be sent to your provider as well.   To learn more about what you can do with MyChart, go to [email or web address].    Your next appointment:   1 year(s)  The format for your next appointment:   In Person  Provider:   Jonathan Branch, MD    Other Instructions    

## 2021-04-13 NOTE — Progress Notes (Signed)
Virtual Visit via Telephone Note   This visit type was conducted due to national recommendations for restrictions regarding the COVID-19 Pandemic (e.g. social distancing) in an effort to limit this patient's exposure and mitigate transmission in our community.  Due to his co-morbid illnesses, this patient is at least at moderate risk for complications without adequate follow up.  This format is felt to be most appropriate for this patient at this time.  The patient did not have access to video technology/had technical difficulties with video requiring transitioning to audio format only (telephone).  All issues noted in this document were discussed and addressed.  No physical exam could be performed with this format.  Please refer to the patient's chart for his  consent to telehealth for [facility].    Date:  04/13/2021   ID:  Jason Yu, DOB 1951/04/18, MRN [id number] The patient was identified using 2 identifiers.  Patient Location: Home Provider Location: Office/Clinic   PCP:  Suzan Slick, MD   [facility] Providers Cardiologist:  Dina Rich, MD Electrophysiologist:  Hillis Range, MD     Evaluation Performed:  Follow-Up Visit  Chief Complaint:  Follow up visit  History of Present Illness:    Jason Yu is a 70 y.o. male seen today for follow up of the following medical problems.    1. HTN - he called 08/2019 about some elevated bp's. We increased his norvasc to 10mg  daily. - has gained some weight during the pandemic, around 10 to 12 lbs    Home repeat bp 129/69 p 71 - compliant with meds   2. Complete heart block   - s/p pacemaker placement 2004      - upcoming visit with EP next month       3. Hyperlipidemia    09/2020 TC 188 TG 159 HDL 40 LDL 120 - he is on atorvastatin 40mg  daily.    The patient does not have symptoms concerning for COVID-19 infection (fever, chills, cough, or new shortness of breath).    Past Medical History:   Diagnosis Date   Anxiety    Asthma    hasn't used inhaler at least 4-5 months.   Complete heart block [facility])    with prior syncope s/p PPM 2004 in [facility] (MDT [facility]);  08/16/2011 PPM Gen. Change: MDT Adapta L ADDDR1, [place] [facility].   Complication of anesthesia 05/2011   had difficulty breathing after surgery=C8 nerve root compression   DDD (degenerative disc disease)    with multiple prior back surgeries    GERD (gastroesophageal reflux disease)    Hepatitis    Hx Hepatitis A & B   HLD (hyperlipidemia)    HTN (hypertension), benign    Pacemaker    Pneumonia last 1998   has had 8x   PONV (postoperative nausea and vomiting)    Shortness of breath    Syncope    a.  2001 - normal cath;  b. 2012 Normal Myoview, EF 58%.   Past Surgical History:  Procedure Laterality Date   BACK SURGERY  08/2009, 09/2011   CARDIAC CATHETERIZATION  2001   no significant CAD   CARPAL TUNNEL RELEASE  1979   cervical neck surgeries     INSERT / REPLACE / REMOVE PACEMAKER  08/2011   initially inserted 12/2002   pacemaker generator change  08/16/11   Medtronic Adapta L implanted by Dr 01/2003, lead implangted 2004 in [place]   PACEMAKER GENERATOR CHANGE Left 08/16/2011   Procedure: PACEMAKER GENERATOR  CHANGE;  Surgeon: Hillis Range, MD;  Location: [facility] CATH LAB;  Service: Cardiovascular;  Laterality: Left;   PACEMAKER INSERTION     Implanted 2004 in [place]   SHOULDER ARTHROSCOPY DISTAL CLAVICLE EXCISION AND OPEN ROTATOR CUFF REPAIR  2009   right   SHOULDER ARTHROSCOPY DISTAL CLAVICLE EXCISION AND OPEN ROTATOR CUFF REPAIR  2010   Left   ULNAR TUNNEL RELEASE  1980's(late) or early 1990's     No outpatient medications have been marked as taking for the 04/13/21 encounter (Appointment) with Antoine Poche, MD.     Allergies:   Other, Adhesive [tape], Codeine, and Prednisone   Social History   Tobacco Use   Smoking status: Former    Packs/day: 2.00    Years: 3.00    Pack years: 6.00    Types:  Cigarettes    Start date: 07/11/1968    Quit date: 11/29/1977    Years since quitting: 43.4   Smokeless tobacco: Never  Substance Use Topics   Alcohol use: No    Alcohol/week: 0.0 standard drinks   Drug use: Yes    Types: Marijuana    Comment: smokes occasional marijuana, no ready to quit     Family Hx: The patient's family history is negative for Diabetes, Hypertension, and Coronary artery disease.  ROS:   Please see the history of present illness.     All other systems reviewed and are negative.   Prior CV studies:   The following studies were reviewed today:    Labs/Other Tests and Data Reviewed:    EKG:  No ECG reviewed.  Recent Labs: No results found for requested labs within last 8760 hours.   Recent Lipid Panel No results found for: CHOL, TRIG, HDL, CHOLHDL, LDLCALC, LDLDIRECT  Wt Readings from Last 3 Encounters:  08/18/20 170 lb (77.1 kg)  05/05/20 175 lb (79.4 kg)  02/15/20 168 lb (76.2 kg)     Risk Assessment/Calculations:          Objective:    Vital Signs:   Today's Vitals   04/13/21 1143  BP: (!) 153/84  Pulse: 63  Weight: 165 lb (74.8 kg)  Height: 5\' 4"  (1.626 m)   Body mass index is 28.32 kg/m. Normal affect. Normal speech pattern and tone. Comforatble, no apparent distress. No audible signs of sob or wheezing  ASSESSMENT & PLAN:      1. HTN - on his home recheck he is at goal, continue current meds     2. Complete heart block   -upcoming appt next month - no recent symptoms     3. Hyperlipidemia - reasonable control for him, continue atorvastatin         COVID-19 Education: The signs and symptoms of COVID-19 were discussed with the patient and how to seek care for testing (follow up with PCP or arrange E-visit).  The importance of social distancing was discussed today.  Time:   Today, I have spent 18 minutes with the patient with telehealth technology discussing the above problems.     Medication Adjustments/Labs and  Tests Ordered: Current medicines are reviewed at length with the patient today.  Concerns regarding medicines are outlined above.   Tests Ordered: No orders of the defined types were placed in this encounter.   Medication Changes: No orders of the defined types were placed in this encounter.   Follow Up:  In Person 1 year  Signed, , MD  04/13/2021 9:35 AM    [facility]

## 2021-05-02 ENCOUNTER — Telehealth: Payer: Self-pay | Admitting: Internal Medicine

## 2021-05-02 NOTE — Telephone Encounter (Signed)
Patient called in to see if he can switch is office visit on 11/4 to a virtual visit. Please advise

## 2021-05-02 NOTE — Telephone Encounter (Signed)
Appointment has been moved to 2:00 pm.  Patient stated that he needed later in the day to be able to get moving better.

## 2021-05-04 ENCOUNTER — Encounter: Payer: Medicare HMO | Admitting: Internal Medicine

## 2021-05-07 ENCOUNTER — Ambulatory Visit: Payer: Medicare HMO

## 2021-07-06 ENCOUNTER — Encounter: Payer: Medicare HMO | Admitting: Internal Medicine

## 2021-08-06 ENCOUNTER — Ambulatory Visit (INDEPENDENT_AMBULATORY_CARE_PROVIDER_SITE_OTHER): Payer: Medicare HMO

## 2021-08-06 DIAGNOSIS — I442 Atrioventricular block, complete: Secondary | ICD-10-CM | POA: Diagnosis not present

## 2021-08-07 LAB — CUP PACEART REMOTE DEVICE CHECK
Battery Impedance: 2071 Ohm
Battery Remaining Longevity: 28 mo
Battery Voltage: 2.75 V
Brady Statistic AP VP Percent: 21 %
Brady Statistic AP VS Percent: 0 %
Brady Statistic AS VP Percent: 79 %
Brady Statistic AS VS Percent: 0 %
Date Time Interrogation Session: 20230207125057
Implantable Lead Implant Date: 20040709
Implantable Lead Implant Date: 20040709
Implantable Lead Location: 753859
Implantable Lead Location: 753860
Implantable Lead Model: 5076
Implantable Lead Model: 5076
Implantable Pulse Generator Implant Date: 20130215
Lead Channel Impedance Value: 434 Ohm
Lead Channel Impedance Value: 541 Ohm
Lead Channel Pacing Threshold Amplitude: 0.5 V
Lead Channel Pacing Threshold Amplitude: 1.5 V
Lead Channel Pacing Threshold Pulse Width: 0.4 ms
Lead Channel Pacing Threshold Pulse Width: 0.4 ms
Lead Channel Setting Pacing Amplitude: 2 V
Lead Channel Setting Pacing Amplitude: 3 V
Lead Channel Setting Pacing Pulse Width: 0.4 ms
Lead Channel Setting Sensing Sensitivity: 5.6 mV

## 2021-08-08 NOTE — Progress Notes (Signed)
Remote pacemaker transmission.   

## 2021-08-19 ENCOUNTER — Other Ambulatory Visit: Payer: Self-pay | Admitting: Cardiology

## 2021-09-04 ENCOUNTER — Encounter (INDEPENDENT_AMBULATORY_CARE_PROVIDER_SITE_OTHER): Payer: Self-pay | Admitting: *Deleted

## 2021-09-07 ENCOUNTER — Ambulatory Visit (INDEPENDENT_AMBULATORY_CARE_PROVIDER_SITE_OTHER): Payer: Medicare HMO | Admitting: Internal Medicine

## 2021-09-07 ENCOUNTER — Other Ambulatory Visit: Payer: Self-pay

## 2021-09-07 VITALS — BP 124/70 | HR 88 | Ht 64.0 in | Wt 171.2 lb

## 2021-09-07 DIAGNOSIS — I10 Essential (primary) hypertension: Secondary | ICD-10-CM

## 2021-09-07 DIAGNOSIS — I442 Atrioventricular block, complete: Secondary | ICD-10-CM

## 2021-09-07 DIAGNOSIS — D6869 Other thrombophilia: Secondary | ICD-10-CM | POA: Diagnosis not present

## 2021-09-07 DIAGNOSIS — I480 Paroxysmal atrial fibrillation: Secondary | ICD-10-CM

## 2021-09-07 NOTE — Patient Instructions (Signed)
Medication Instructions:  Continue all current medications.  Labwork: none  Testing/Procedures: none  Follow-Up: 1 year - Dr.  Allred   Any Other Special Instructions Will Be Listed Below (If Applicable).   If you need a refill on your cardiac medications before your next appointment, please call your pharmacy.  

## 2021-09-07 NOTE — Progress Notes (Signed)
? ? ?PCP: Suzan Slick, MD ?Primary Cardiologist: Dr Wyline Mood ?Primary EP:  Dr [place]ney [facility] ? ?Jason Yu is a 71 y.o. male who presents today for routine electrophysiology followup.  Since last being seen in our clinic, the patient reports doing reasonably well.  He has chronic SOB due to COPD.  This appears stable.  He continues to smoke marijuana.  Today, he denies symptoms of palpitations, chest pain, lower extremity edema, dizziness, presyncope, or syncope.  He has chronic neck and back pain.  This is again his complaint today. The patient is otherwise without complaint today.  ? ?Past Medical History:  ?Diagnosis Date  ? Anxiety   ? Asthma   ? hasn't used inhaler at least 4-5 months.  ? Complete heart block (HCC)   ? with prior syncope s/p PPM 2004 in [place] (MDT [facility]);  08/16/2011 PPM Gen. Change: MDT Adapta L ADDDR1, [id number].  ? Complication of anesthesia 05/2011  ? had difficulty breathing after surgery=C8 nerve root compression  ? DDD (degenerative disc disease)   ? with multiple prior back surgeries   ? GERD (gastroesophageal reflux disease)   ? Hepatitis   ? Hx Hepatitis A & B  ? HLD (hyperlipidemia)   ? HTN (hypertension), benign   ? Pacemaker   ? Pneumonia last 1998  ? has had 8x  ? PONV (postoperative nausea and vomiting)   ? Shortness of breath   ? Syncope   ? a.  2001 - normal cath;  b. 2012 Normal Myoview, EF 58%.  ? ?Past Surgical History:  ?Procedure Laterality Date  ? BACK SURGERY  08/2009, 09/2011  ? CARDIAC CATHETERIZATION  2001  ? no significant CAD  ? CARPAL TUNNEL RELEASE  1979  ? cervical neck surgeries    ? INSERT / REPLACE / REMOVE PACEMAKER  08/2011  ? initially inserted 12/2002  ? pacemaker generator change  08/16/11  ? Medtronic Adapta L implanted by Dr [place], lead implangted 2004 in [place]  ? PACEMAKER GENERATOR CHANGE Left 08/16/2011  ? Procedure: PACEMAKER GENERATOR CHANGE;  Surgeon: Hillis Range, MD;  Location: [facility] CATH LAB;  Service: Cardiovascular;  Laterality: Left;  ?  PACEMAKER INSERTION    ? Implanted 2004 in [place]  ? SHOULDER ARTHROSCOPY DISTAL CLAVICLE EXCISION AND OPEN ROTATOR CUFF REPAIR  2009  ? right  ? SHOULDER ARTHROSCOPY DISTAL CLAVICLE EXCISION AND OPEN ROTATOR CUFF REPAIR  2010  ? Left  ? ULNAR TUNNEL RELEASE  1980's(late) or early 1990's  ? ? ?ROS- all systems are reviewed and negative except as per HPI above ? ?Current Outpatient Medications  ?Medication Sig Dispense Refill  ? amLODipine (NORVASC) 10 MG tablet TAKE 1/2 TABLET BY MOUTH IN THE MORNING and TAKE 1/2 TABLET EVERY EVENING (cut in half) 90 tablet 1  ? Ascorbic Acid (VITAMIN C) 1000 MG tablet Take 1,000 mg by mouth daily.      ? atorvastatin (LIPITOR) 40 MG tablet TAKE 1 TABLET BY MOUTH DAILY 90 tablet 1  ? BREO ELLIPTA 200-25 MCG/INH AEPB inhale one PUFF into lungs daily 60 each 3  ? Cholecalciferol (VITAMIN D) 2000 UNITS CAPS Take 2,000 Units by mouth daily.     ? co-enzyme Q-10 30 MG capsule Take 30 mg by mouth daily.    ? diazepam (VALIUM) 10 MG tablet Take 20 mg by mouth daily as needed. For anxiety    ? Flaxseed, Linseed, (FLAX SEED OIL) 1000 MG CAPS Take 4,000 mg by mouth daily.    ? hydrochlorothiazide (  HYDRODIURIL) 25 MG tablet TAKE 1 TABLET BY MOUTH DAILY 90 tablet 1  ? ibuprofen (ADVIL) 600 MG tablet Take 1 tablet by mouth as needed.    ? meclizine (ANTIVERT) 12.5 MG tablet Take 12.5 mg by mouth 3 (three) times daily as needed for dizziness.    ? Menthol, Topical Analgesic, 2.5 % LIQD Apply 1 application topically 3 (three) times daily as needed (for muscle pain).    ? Multiple Vitamin (MULTIVITAMIN) tablet Take 1 tablet by mouth daily.    ? omeprazole (PRILOSEC) 20 MG capsule Take 20 mg by mouth 2 (two) times daily as needed (for heartburn or acid reflux).     ? PARoxetine (PAXIL) 20 MG tablet Take 20 mg by mouth daily.     ? SPIRIVA HANDIHALER 18 MCG inhalation capsule Place 1 capsule into inhaler and inhale daily.    ? UNABLE TO FIND CBD oil as needed    ? VENTOLIN HFA 108 (90 Base) MCG/ACT  inhaler INHALE TWO PUFFS BY MOUTH EVERY 6 HOURS AS NEEDED SHORTNESS OF BREATH 18 g 0  ? vitamin B-12 (CYANOCOBALAMIN) 1000 MCG tablet Take 1,000 mcg by mouth daily.    ? vitamin E 1000 UNIT capsule Take 1,000 Units by mouth daily.    ? Zinc 50 MG CAPS Take by mouth.    ? ?No current facility-administered medications for this visit.  ? ? ?Physical Exam: ?Vitals:  ? 09/07/21 1116  ?BP: 124/70  ?Pulse: 88  ?SpO2: 92%  ?Weight: 171 lb 3.2 oz (77.7 kg)  ?Height: 5\' 4"  (1.626 m)  ?  ?GEN- The patient is well appearing, alert and oriented x 3 today.   ?Head- normocephalic, atraumatic ?Eyes-  Sclera clear, conjunctiva pink ?Ears- hearing intact ?Oropharynx- clear ?Lungs- Clear to ausculation bilaterally, normal work of breathing ?Chest- pacemaker pocket is well healed ?Heart- Regular rate and rhythm, no murmurs, rubs or gallops, PMI not laterally displaced ?GI- soft, NT, ND, + BS ?Extremities- no clubbing, cyanosis, or edema ? ?Pacemaker interrogation- reviewed in detail today,  See PACEART report ? ?ekg tracing ordered today is personally reviewed and shows sinus with V pacing ? ?Assessment and Plan: ? ?1. Symptomatic complete heart block ?Normal pacemaker function ?See Art report ?No changes today ?he is device dependant today ? ?2. HTN ?Stable ?No change required today ? ?3. Afib ?Newly discovered on PPM ?Longest episode 11 minutes,  burden <0.1% ?Continue to monitor for now.  I would not consider OAC unless his burden increases  ?  ?Return in a year ? ?Arita Miss MD, FACC ?09/07/2021 ?11:23 AM ? ? ?

## 2021-09-07 NOTE — Addendum Note (Signed)
Addended by: Laurine Blazer on: 09/07/2021 03:39 PM ? ? Modules accepted: Orders ? ?

## 2021-11-05 ENCOUNTER — Ambulatory Visit: Payer: Medicare HMO

## 2021-11-06 ENCOUNTER — Ambulatory Visit (INDEPENDENT_AMBULATORY_CARE_PROVIDER_SITE_OTHER): Payer: Medicaid Other | Admitting: Gastroenterology

## 2022-01-03 ENCOUNTER — Ambulatory Visit (INDEPENDENT_AMBULATORY_CARE_PROVIDER_SITE_OTHER): Payer: Medicare HMO | Admitting: Gastroenterology

## 2022-01-03 ENCOUNTER — Encounter (INDEPENDENT_AMBULATORY_CARE_PROVIDER_SITE_OTHER): Payer: Self-pay | Admitting: Gastroenterology

## 2022-01-03 VITALS — BP 148/69 | HR 60 | Temp 99.0°F | Ht 64.0 in | Wt 162.9 lb

## 2022-01-03 DIAGNOSIS — K921 Melena: Secondary | ICD-10-CM | POA: Diagnosis not present

## 2022-01-03 NOTE — Progress Notes (Cosign Needed)
Referring Provider: Suzan Slick, MD Primary Care Physician:  Suzan Slick, MD Primary GI Physician: new  Chief Complaint  Patient presents with   Rectal Bleeding    Saw some blood when wiping after BM. One time saw a blood clot. Takes omeprazole for heartburn and states it works well.    HPI:   Jason Yu is a 71 y.o. male with past medical history of anxiety, asthma, complete heart block, DDD, GERD, HLD, HTN, pacemaker, hepatitis A&B.   Patient presenting today as a new patient for rectal bleeding.  Patient reports that he had two previous episodes of toilet tissue hemataochezia. He denies any blood in toilet or stool. He states that he did see a blood clot on tissue on one occasion. He reports that he has had 3 lumbar spinal surgeries previously, therefore he has some sphincter weakness. He states that he felt around down there recently and felt what he thinks is a hemorrhoid. He has not noticed rectal bleeding in the past few months. He has a BM every morning. Denies constipation or diarrhea. He states no abdominal pain. He denies rectal itching, burning or pain.   He does note that after the last surgery he had, his appetite decreased some, however, he does try to eat as he knows he needs to. He has had no unintentional weight loss. He had an episode of nausea with dry heaves a few months back but denies any ongoing nausea or vomiting. He has lost about 28 lbs recently on purpose due to his COPD but cutting out some sweets.   Saw PCP yesterday who ordered a cologuard for him. He wishes to proceed with this at this time.  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  Hep C negative in 2020  NSAID use: 600mg  ibuprofen, 4-5x/month Social hx:no alcohol in 20+ years, he does use CBD oil for pain control  Fam hx: no crc or liver disease   Last Colonoscopy:no colonoscopy, cologuard negative in July 2019 Last  Endoscopy:never  Recommendations:    Past Medical History:  Diagnosis Date   Anxiety    Asthma    hasn't used inhaler at least 4-5 months.   Complete heart block [facility])    with prior syncope s/p PPM 2004 in 2005 (MDT [place]);  08/16/2011 PPM Gen. Change: MDT Adapta L ADDDR1, 08/18/2011 H.   Complication of anesthesia 05/2011   had difficulty breathing after surgery=C8 nerve root compression   DDD (degenerative disc disease)    with multiple prior back surgeries    GERD (gastroesophageal reflux disease)    Hepatitis    Hx Hepatitis A & B   HLD (hyperlipidemia)    HTN (hypertension), benign    Pacemaker    Pneumonia last 1998   has had 8x   PONV (postoperative nausea and vomiting)    Shortness of breath    Syncope    a.  2001 - normal cath;  b. 2012 Normal Myoview, EF 58%.    Past Surgical History:  Procedure Laterality Date   BACK SURGERY  08/2009, 09/2011   CARDIAC CATHETERIZATION  2001   no significant CAD   CARPAL TUNNEL RELEASE  1979   cervical neck surgeries     INSERT / REPLACE / REMOVE PACEMAKER  08/2011   initially inserted 12/2002   pacemaker generator change  08/16/11   Medtronic Adapta L implanted by Dr 08/18/11, lead implangted 2004 in 2005  PACEMAKER GENERATOR CHANGE Left 08/16/2011   Procedure: PACEMAKER GENERATOR CHANGE;  Surgeon: Hillis Range, MD;  Location: [facility] CATH LAB;  Service: Cardiovascular;  Laterality: Left;   PACEMAKER INSERTION     Implanted 2004 in [place]   SHOULDER ARTHROSCOPY DISTAL CLAVICLE EXCISION AND OPEN ROTATOR CUFF REPAIR  2009   right   SHOULDER ARTHROSCOPY DISTAL CLAVICLE EXCISION AND OPEN ROTATOR CUFF REPAIR  2010   Left   ULNAR TUNNEL RELEASE  1980's(late) or early 1990's    Current Outpatient Medications  Medication Sig Dispense Refill   amLODipine (NORVASC) 10 MG tablet TAKE 1/2 TABLET BY MOUTH IN THE MORNING and TAKE 1/2 TABLET EVERY EVENING (cut in half) 90 tablet 1   Ascorbic Acid (VITAMIN C) 1000 MG tablet Take 1,000 mg by  mouth daily.       atorvastatin (LIPITOR) 40 MG tablet TAKE 1 TABLET BY MOUTH DAILY 90 tablet 1   BREO ELLIPTA 200-25 MCG/INH AEPB inhale one PUFF into lungs daily 60 each 3   Cholecalciferol (VITAMIN D) 2000 UNITS CAPS Take 2,000 Units by mouth daily.      co-enzyme Q-10 30 MG capsule Take 30 mg by mouth daily.     diazepam (VALIUM) 10 MG tablet Take 20 mg by mouth daily as needed. For anxiety     Flaxseed, Linseed, (FLAX SEED OIL) 1000 MG CAPS Take 4,000 mg by mouth daily.     hydrochlorothiazide (HYDRODIURIL) 25 MG tablet TAKE 1 TABLET BY MOUTH DAILY 90 tablet 1   ibuprofen (ADVIL) 600 MG tablet Take 1 tablet by mouth as needed.     meclizine (ANTIVERT) 12.5 MG tablet Take 12.5 mg by mouth 3 (three) times daily as needed for dizziness.     Menthol, Topical Analgesic, 2.5 % LIQD Apply 1 application topically 3 (three) times daily as needed (for muscle pain).     Multiple Vitamin (MULTIVITAMIN) tablet Take 1 tablet by mouth daily.     omeprazole (PRILOSEC) 20 MG capsule Take 20 mg by mouth 2 (two) times daily as needed (for heartburn or acid reflux).      OVER THE COUNTER MEDICATION CBD oil as needed for pain     PARoxetine (PAXIL) 20 MG tablet Take 20 mg by mouth daily.      SPIRIVA HANDIHALER 18 MCG inhalation capsule Place 1 capsule into inhaler and inhale daily.     UNABLE TO FIND CBD oil as needed     VENTOLIN HFA 108 (90 Base) MCG/ACT inhaler INHALE TWO PUFFS BY MOUTH EVERY 6 HOURS AS NEEDED SHORTNESS OF BREATH 18 g 0   vitamin B-12 (CYANOCOBALAMIN) 1000 MCG tablet Take 1,000 mcg by mouth daily.     vitamin E 1000 UNIT capsule Take 1,000 Units by mouth daily.     Zinc 50 MG CAPS Take by mouth.     No current facility-administered medications for this visit.    Allergies as of 01/03/2022 - Review Complete 01/03/2022  Allergen Reaction Noted   Other Other (See Comments) 10/18/2011   Adhesive [tape] Other (See Comments) 12/06/2015   Codeine Itching and Rash    Prednisone Other (See  Comments)     Family History  Problem Relation Age of Onset   Diabetes Neg Hx    Hypertension Neg Hx    Coronary artery disease Neg Hx     Social History   Socioeconomic History   Marital status: Single    Spouse name: Not on file   Number of children: Not on file  Years of education: Not on file   Highest education level: Not on file  Occupational History   Not on file  Tobacco Use   Smoking status: Former    Packs/day: 2.00    Years: 3.00    Total pack years: 6.00    Types: Cigarettes    Start date: 07/11/1968    Quit date: 11/29/1977    Years since quitting: 44.1   Smokeless tobacco: Never  Vaping Use   Vaping Use: Never used  Substance and Sexual Activity   Alcohol use: No    Alcohol/week: 0.0 standard drinks of alcohol   Drug use: Yes    Types: Marijuana    Comment: smokes occasional marijuana, no ready to quit   Sexual activity: Not on file  Other Topics Concern   Not on file  Social History Narrative   Lives alone   Social Determinants of Health   Financial Resource Strain: Not on file  Food Insecurity: Not on file  Transportation Needs: Not on file  Physical Activity: Not on file  Stress: Not on file  Social Connections: Not on file   Review of systems General: negative for malaise, night sweats, fever, chills, weight loss Neck: Negative for lumps, goiter, pain and significant neck swelling Resp: Negative for cough, wheezing, dyspnea at rest CV: Negative for chest pain, leg swelling, palpitations, orthopnea GI: denies melena, nausea, vomiting, diarrhea, constipation, dysphagia, odyonophagia, early satiety or unintentional weight loss. +toilet tissue hematochezia MSK: Negative for joint pain or swelling, back pain, and muscle pain. Derm: Negative for itching or rash Psych: Denies depression, anxiety, memory loss, confusion. No homicidal or suicidal ideation.  Heme: Negative for prolonged bleeding, bruising easily, and swollen nodes. Endocrine:  Negative for cold or heat intolerance, polyuria, polydipsia and goiter. Neuro: negative for tremor, gait imbalance, syncope and seizures. The remainder of the review of systems is noncontributory.  Physical Exam: BP (!) 148/69 (BP Location: Left Arm, Patient Position: Sitting, Cuff Size: Large)   Pulse 60   Temp 99 F (37.2 C) (Oral)   Ht 5\' 4"  (1.626 m)   Wt 162 lb 14.4 oz (73.9 kg)   BMI 27.96 kg/m  General:   Alert and oriented. No distress noted. Pleasant and cooperative.  Head:  Normocephalic and atraumatic. Eyes:  Conjuctiva clear without scleral icterus. Mouth:  Oral mucosa pink and moist. Good dentition. No lesions. Heart: Normal rate and rhythm, s1 and s2 heart sounds present.  Lungs: Clear lung sounds in all lobes. Respirations equal and unlabored. Abdomen:  +BS, soft, non-tender and non-distended. No rebound or guarding. No HSM or masses noted. Derm: No palmar erythema or jaundice Msk:  Symmetrical without gross deformities. Normal posture. Extremities:  Without edema. Neurologic:  Alert and  oriented x4 Psych:  Alert and cooperative. Normal mood and affect.  Invalid input(s): "6 MONTHS"   ASSESSMENT: Jason Yu is a 71 y.o. male presenting today as a new patient for rectal bleeding.  Patient with two previous episodes of toilet tissue hematochezia, no recent episodes. No changes in bowel habits, melena, or weight loss. Has not seen any rectal bleeding in the past few months. Suspect symptoms are hemorrhoid related as he felt the presence of a hemorrhoid recently, however, Given that patient has never had a colonoscopy, I did recommend proceeding with colonoscopy for further evaluation of rectal bleeding vs cologuard that PCP ordered. We discussed that Cologuard is intended for the qualitative detection of colorectal neoplasia associated DNA markers and for the presence  of occult hemoglobin in human stool. A positive result may indicate the presence of colorectal cancer  (CRC) or pre cancerous polyps, and should be followed by colonoscopy. A negative Cologuard test result does not guarantee absence of cancer or pre cancerous polyp. False positives and false negatives do occur. I advised the patient that cologuard is not recommended in patient with rectal bleeding as it cannot definitively identify etiology of this and may likely be positive.  Patient still wishes at this time to proceed with cologuard, I discussed this in depth with the patient again, that this modality is not recommended in his situation as a colonoscopy is the only method to give visualization of the colon to ensure no other underlying causes of bleeding such as malignany. Patient still wishes to proceed with cologuard, he will make me aware if he changes his mind or if cologuard is positive as he is amenable to pursuing colonoscopy if it is.    PLAN:  Pt to make me aware if he decides to pursue colonoscopy 2. Stay well hydrated to keep stools soft  3. Will make me aware of further rectal bleeding  All questions were answered, patient verbalized understanding and is in agreement with plan as outlined above.   Follow Up: PRN  Amour Cutrone L. Jeanmarie Hubert, MSN, APRN, AGNP-C Adult-Gerontology Nurse Practitioner [facility]  I agree with the findings in this note. We will need to confirm if he indeed had resolution of hepatitis B with repeat hepatitis B surface antigen testing.  If negative no further work-up will be warranted but if positive will need to have hepatitis B viral load, CMP, hepatitis B E antigen/antibody checked, as well as HIV, hepatitis A serology and HCV testing  Katrinka Blazing, MD Gastroenterology and Hepatology [facility]

## 2022-01-03 NOTE — Patient Instructions (Signed)
I suspect that rectal bleeding is related to hemorrhoids, however, as we discussed you have never had a colonoscopy, ultimately my recommendation for further evaluation of rectal bleeding and to screen for any presence of colon cancer would be a colonoscopy as Cologuard is intended for the qualitative detection of colorectal neoplasia associated DNA markers and for the presence of occult hemoglobin in human stool. A positive result may indicate the presence of colorectal cancer (CRC) or pre cancerous polyps, and should be followed by colonoscopy. A negative Cologuard test result does not guarantee absence of cancer or pre cancerous polyp.False positives and false negatives do occur.   Please let me know if you decide you would like to proceed with colonoscopy   Continue to stay well hydrated and keep stools soft, let me know if bleeding recurs

## 2022-01-09 ENCOUNTER — Telehealth (INDEPENDENT_AMBULATORY_CARE_PROVIDER_SITE_OTHER): Payer: Self-pay | Admitting: Gastroenterology

## 2022-01-09 NOTE — Telephone Encounter (Signed)
Left message to return call 

## 2022-01-11 NOTE — Telephone Encounter (Signed)
Discussed with patient and he states he is not interested in doing labs and not concerned about hep B. States he had it back when he was a teenager and was in the hospital and since he is almost 56 now he declined to do labs. He did tell me he had some routine labs with pcp at his physical last week that he has not got results on yet if you wanted me to get the results of let me know.

## 2022-01-11 NOTE — Telephone Encounter (Signed)
Left message to return call 

## 2022-01-14 NOTE — Telephone Encounter (Signed)
Ann placed labs on your desk

## 2022-01-14 NOTE — Telephone Encounter (Signed)
Called Dr. Verna Czech office and had to leave a message requesting last labs to be faxed. Await results.

## 2022-02-04 ENCOUNTER — Ambulatory Visit (INDEPENDENT_AMBULATORY_CARE_PROVIDER_SITE_OTHER): Payer: Medicare HMO

## 2022-02-04 DIAGNOSIS — I442 Atrioventricular block, complete: Secondary | ICD-10-CM

## 2022-02-08 LAB — CUP PACEART REMOTE DEVICE CHECK
Battery Impedance: 2245 Ohm
Battery Remaining Longevity: 25 mo
Battery Voltage: 2.74 V
Brady Statistic AP VP Percent: 30 %
Brady Statistic AP VS Percent: 0 %
Brady Statistic AS VP Percent: 70 %
Brady Statistic AS VS Percent: 0 %
Date Time Interrogation Session: 20230810172134
Implantable Lead Implant Date: 20040709
Implantable Lead Implant Date: 20040709
Implantable Lead Location: 753859
Implantable Lead Location: 753860
Implantable Lead Model: 5076
Implantable Lead Model: 5076
Implantable Pulse Generator Implant Date: 20130215
Lead Channel Impedance Value: 389 Ohm
Lead Channel Impedance Value: 514 Ohm
Lead Channel Pacing Threshold Amplitude: 0.625 V
Lead Channel Pacing Threshold Amplitude: 1.5 V
Lead Channel Pacing Threshold Pulse Width: 0.4 ms
Lead Channel Pacing Threshold Pulse Width: 0.4 ms
Lead Channel Setting Pacing Amplitude: 2 V
Lead Channel Setting Pacing Amplitude: 3 V
Lead Channel Setting Pacing Pulse Width: 0.4 ms
Lead Channel Setting Sensing Sensitivity: 5.6 mV

## 2022-02-13 LAB — COLOGUARD: COLOGUARD: NEGATIVE

## 2022-02-14 ENCOUNTER — Other Ambulatory Visit: Payer: Self-pay | Admitting: Cardiology

## 2022-03-06 NOTE — Progress Notes (Signed)
Remote pacemaker transmission.   

## 2022-05-06 ENCOUNTER — Ambulatory Visit (INDEPENDENT_AMBULATORY_CARE_PROVIDER_SITE_OTHER): Payer: Medicare HMO

## 2022-05-06 DIAGNOSIS — I442 Atrioventricular block, complete: Secondary | ICD-10-CM | POA: Diagnosis not present

## 2022-05-08 LAB — CUP PACEART REMOTE DEVICE CHECK
Battery Impedance: 2357 Ohm
Battery Remaining Longevity: 24 mo
Battery Voltage: 2.74 V
Brady Statistic AP VP Percent: 33 %
Brady Statistic AP VS Percent: 0 %
Brady Statistic AS VP Percent: 67 %
Brady Statistic AS VS Percent: 0 %
Date Time Interrogation Session: 20231107113826
Implantable Lead Connection Status: 753985
Implantable Lead Connection Status: 753985
Implantable Lead Implant Date: 20040709
Implantable Lead Implant Date: 20040709
Implantable Lead Location: 753859
Implantable Lead Location: 753860
Implantable Lead Model: 5076
Implantable Lead Model: 5076
Implantable Pulse Generator Implant Date: 20130215
Lead Channel Impedance Value: 396 Ohm
Lead Channel Impedance Value: 522 Ohm
Lead Channel Pacing Threshold Amplitude: 0.5 V
Lead Channel Pacing Threshold Amplitude: 1.5 V
Lead Channel Pacing Threshold Pulse Width: 0.4 ms
Lead Channel Pacing Threshold Pulse Width: 0.4 ms
Lead Channel Setting Pacing Amplitude: 2 V
Lead Channel Setting Pacing Amplitude: 3 V
Lead Channel Setting Pacing Pulse Width: 0.4 ms
Lead Channel Setting Sensing Sensitivity: 5.6 mV
Zone Setting Status: 755011
Zone Setting Status: 755011

## 2022-06-05 NOTE — Progress Notes (Signed)
Remote pacemaker transmission.   

## 2022-08-05 ENCOUNTER — Ambulatory Visit: Payer: Medicare Other

## 2022-08-05 DIAGNOSIS — I442 Atrioventricular block, complete: Secondary | ICD-10-CM

## 2022-08-06 LAB — CUP PACEART REMOTE DEVICE CHECK
Battery Impedance: 2388 Ohm
Battery Remaining Longevity: 23 mo
Battery Voltage: 2.73 V
Brady Statistic AP VP Percent: 35 %
Brady Statistic AP VS Percent: 0 %
Brady Statistic AS VP Percent: 65 %
Brady Statistic AS VS Percent: 0 %
Date Time Interrogation Session: 20240206142258
Implantable Lead Connection Status: 753985
Implantable Lead Connection Status: 753985
Implantable Lead Implant Date: 20040709
Implantable Lead Implant Date: 20040709
Implantable Lead Location: 753859
Implantable Lead Location: 753860
Implantable Lead Model: 5076
Implantable Lead Model: 5076
Implantable Pulse Generator Implant Date: 20130215
Lead Channel Impedance Value: 379 Ohm
Lead Channel Impedance Value: 509 Ohm
Lead Channel Pacing Threshold Amplitude: 0.625 V
Lead Channel Pacing Threshold Amplitude: 1.5 V
Lead Channel Pacing Threshold Pulse Width: 0.4 ms
Lead Channel Pacing Threshold Pulse Width: 0.4 ms
Lead Channel Setting Pacing Amplitude: 2 V
Lead Channel Setting Pacing Amplitude: 3 V
Lead Channel Setting Pacing Pulse Width: 0.4 ms
Lead Channel Setting Sensing Sensitivity: 5.6 mV
Zone Setting Status: 755011
Zone Setting Status: 755011

## 2022-08-13 ENCOUNTER — Ambulatory Visit: Payer: Medicaid Other | Admitting: Nurse Practitioner

## 2022-08-13 NOTE — Progress Notes (Deleted)
Cardiology Office Note:    Date:  08/13/2022   ID:  Jason Yu, DOB 04-24-1951, MRN [id number]  PCP:  Leeanne Rio, [place] [facility] Cardiologist:  Carlyle Dolly, MD Electrophysiologist:  Thompson Grayer, MD (Inactive) { Click to update primary MD,subspecialty MD or APP then REFRESH:1}    Referring MD: Leeanne Rio, MD   No chief complaint on file. ***  History of Present Illness:    Jason Yu is a 72 y.o. male with a hx of ***  CHB, s/p PPM in 2004 Hypertension Hyperlipidemia Asthma History of syncope History of hepatitis A & B  Patient is a 72 year old male with past medical history as mentioned above.  Has been followed by EP for history of pacemaker placement due to complete heart block.  Last seen by Dr. Carlyle Dolly on April 13, 2021.  Was doing well at that time.  No medication changes were made.  Was told to follow-up in 1 year.  Today he presents for 1 year follow-up.  He states the following...    Past Medical History:  Diagnosis Date   Anxiety    Asthma    hasn't used inhaler at least 4-5 months.   Complete heart block [facility])    with prior syncope s/p PPM 2004 in [place] (MDT [place]);  08/16/2011 PPM Gen. Change: MDT [place], [id number] H.   Complication of anesthesia 05/2011   had difficulty breathing after surgery=C8 nerve root compression   DDD (degenerative disc disease)    with multiple prior back surgeries    GERD (gastroesophageal reflux disease)    Hepatitis    Hx Hepatitis A & B   HLD (hyperlipidemia)    HTN (hypertension), benign    Pacemaker    Pneumonia last 1998   has had 8x   PONV (postoperative nausea and vomiting)    Shortness of breath    Syncope    a.  2001 - normal cath;  b. 2012 Normal Myoview, EF 58%.    Past Surgical History:  Procedure Laterality Date   BACK SURGERY  08/2009, 09/2011   CARDIAC CATHETERIZATION  2001   no significant CAD   CARPAL TUNNEL RELEASE  1979    cervical neck surgeries     INSERT / REPLACE / REMOVE PACEMAKER  08/2011   initially inserted 12/2002   pacemaker generator change  08/16/11   Medtronic Adapta L implanted by Dr Rayann Heman, lead implangted 2004 in [place] Left 08/16/2011   Procedure: [place];  Surgeon: Thompson Grayer, MD;  Location: [facility] CATH LAB;  Service: Cardiovascular;  Laterality: Left;   PACEMAKER INSERTION     Implanted 2004 in [place]  2009   right   SHOULDER ARTHROSCOPY DISTAL CLAVICLE EXCISION AND OPEN ROTATOR CUFF REPAIR  2010   Left   ULNAR TUNNEL RELEASE  1980's(late) or early 1990's    Current Medications: No outpatient medications have been marked as taking for the 08/13/22 encounter (Appointment) with Finis Bud, NP.     Allergies:   Other, Adhesive [tape], Codeine, and Prednisone   Social History   Socioeconomic History   Marital status: Single    Spouse name: Not on file   Number of children: Not on file   Years of education: Not on file   Highest education level: Not on file  Occupational History   Not on file  Tobacco Use   Smoking status: Former    Packs/day: 2.00    Years: 3.00    Total pack years: 6.00    Types: Cigarettes    Start date: 07/11/1968    Quit date: 11/29/1977    Years since quitting: 44.7   Smokeless tobacco: Never  Vaping Use   Vaping Use: Never used  Substance and Sexual Activity   Alcohol use: No    Alcohol/week: 0.0 standard drinks of alcohol   Drug use: Yes    Types: Marijuana    Comment: smokes occasional marijuana, no ready to quit   Sexual activity: Not on file  Other Topics Concern   Not on file  Social History Narrative   Lives alone   Social Determinants of Health   Financial Resource Strain: Not on file  Food Insecurity: Not on file  Transportation Needs: Not on file  Physical Activity: Not on file  Stress: Not on file  Social  Connections: Not on file     Family History: The patient's ***family history is negative for Diabetes, Hypertension, and Coronary artery disease.  ROS:   Please see the history of present illness.    *** All other systems reviewed and are negative.  EKGs/Labs/Other Studies Reviewed:    The following studies were reviewed today: ***  EKG:  EKG is *** ordered today.  The ekg ordered today demonstrates ***  Recent Labs: No results found for requested labs within last 365 days.  Recent Lipid Panel No results found for: "CHOL", "TRIG", "HDL", "CHOLHDL", "VLDL", "LDLCALC", "LDLDIRECT"   Risk Assessment/Calculations:   {Does this patient have ATRIAL FIBRILLATION?[phone]}  No BP recorded.  {Refresh Note OR Click here to enter BP  :1}***         Physical Exam:    VS:  There were no vitals taken for this visit.    Wt Readings from Last 3 Encounters:  01/03/22 162 lb 14.4 oz (73.9 kg)  09/07/21 171 lb 3.2 oz (77.7 kg)  04/13/21 165 lb (74.8 kg)     GEN: *** Well nourished, well developed in no acute distress HEENT: Normal NECK: No JVD; No carotid bruits LYMPHATICS: No lymphadenopathy CARDIAC: ***RRR, no murmurs, rubs, gallops RESPIRATORY:  Clear to auscultation without rales, wheezing or rhonchi  ABDOMEN: Soft, non-tender, non-distended MUSCULOSKELETAL:  No edema; No deformity  SKIN: Warm and dry NEUROLOGIC:  Alert and oriented x 3 PSYCHIATRIC:  Normal affect   ASSESSMENT:    No diagnosis found. PLAN:    In order of problems listed above:  ***      {Are you ordering a CV Procedure (e.g. stress test, cath, DCCV, TEE, etc)?   Press F2        :[id number]    Medication Adjustments/Labs and Tests Ordered: Current medicines are reviewed at length with the patient today.  Concerns regarding medicines are outlined above.  No orders of the defined types were placed in this encounter.  No orders of the defined types were placed in this encounter.   There are no  Patient Instructions on file for this visit.   SignedFinis Bud, NP  08/13/2022 8:17 AM    [place]

## 2022-08-21 ENCOUNTER — Ambulatory Visit: Payer: Medicaid Other | Admitting: Nurse Practitioner

## 2022-08-21 NOTE — Progress Notes (Deleted)
Cardiology Office Note:    Date:  08/21/2022   ID:  Jason Yu, DOB 08-10-1950, MRN [id number]  PCP:  Leeanne Rio, [place] [facility] Cardiologist:  Carlyle Dolly, MD Electrophysiologist:  Thompson Grayer, MD (Inactive) { Click to update primary MD,subspecialty MD or APP then REFRESH:1}    Referring MD: Leeanne Rio, MD   No chief complaint on file. ***  History of Present Illness:    Jason Yu is a 72 y.o. male with a hx of ***  CHB, s/p PPM in 2004 Hypertension Hyperlipidemia  Patient is a 72 year old male with past medical history as mentioned above.  Last seen by Dr. Carlyle Dolly on April 13, 2021.  At that time, patient reported weight gain around the pandemic.  Blood pressure was well-controlled.  He was doing well at the time.  No medication changes were made.  Was told to follow-up in 1 year.  Today he presents for 1 year follow-up.  He states the following  Past Medical History:  Diagnosis Date   Anxiety    Asthma    hasn't used inhaler at least 4-5 months.   Complete heart block [facility])    with prior syncope s/p PPM 2004 in [place] (MDT [place]);  08/16/2011 PPM Gen. Change: MDT [place], [id number] H.   Complication of anesthesia 05/2011   had difficulty breathing after surgery=C8 nerve root compression   DDD (degenerative disc disease)    with multiple prior back surgeries    GERD (gastroesophageal reflux disease)    Hepatitis    Hx Hepatitis A & B   HLD (hyperlipidemia)    HTN (hypertension), benign    Pacemaker    Pneumonia last 1998   has had 8x   PONV (postoperative nausea and vomiting)    Shortness of breath    Syncope    a.  2001 - normal cath;  b. 2012 Normal Myoview, EF 58%.    Past Surgical History:  Procedure Laterality Date   BACK SURGERY  08/2009, 09/2011   CARDIAC CATHETERIZATION  2001   no significant CAD   CARPAL TUNNEL RELEASE  1979   cervical neck surgeries     INSERT / REPLACE /  REMOVE PACEMAKER  08/2011   initially inserted 12/2002   pacemaker generator change  08/16/11   Medtronic Adapta L implanted by Dr Rayann Heman, lead implangted 2004 in [place] Left 08/16/2011   Procedure: [place];  Surgeon: Thompson Grayer, MD;  Location: [facility] CATH LAB;  Service: Cardiovascular;  Laterality: Left;   PACEMAKER INSERTION     Implanted 2004 in [place]  2009   right   SHOULDER ARTHROSCOPY DISTAL CLAVICLE EXCISION AND OPEN ROTATOR CUFF REPAIR  2010   Left   ULNAR TUNNEL RELEASE  1980's(late) or early 1990's    Current Medications: No outpatient medications have been marked as taking for the 08/21/22 encounter (Appointment) with Finis Bud, NP.     Allergies:   Other, Adhesive [tape], Codeine, and Prednisone   Social History   Socioeconomic History   Marital status: Single    Spouse name: Not on file   Number of children: Not on file   Years of education: Not on file   Highest education level: Not on file  Occupational History   Not on file  Tobacco Use   Smoking status: Former    Packs/day:  2.00    Years: 3.00    Total pack years: 6.00    Types: Cigarettes    Start date: 07/11/1968    Quit date: 11/29/1977    Years since quitting: 44.7   Smokeless tobacco: Never  Vaping Use   Vaping Use: Never used  Substance and Sexual Activity   Alcohol use: No    Alcohol/week: 0.0 standard drinks of alcohol   Drug use: Yes    Types: Marijuana    Comment: smokes occasional marijuana, no ready to quit   Sexual activity: Not on file  Other Topics Concern   Not on file  Social History Narrative   Lives alone   Social Determinants of Health   Financial Resource Strain: Not on file  Food Insecurity: Not on file  Transportation Needs: Not on file  Physical Activity: Not on file  Stress: Not on file  Social Connections: Not on file     Family  History: The patient's ***family history is negative for Diabetes, Hypertension, and Coronary artery disease.  ROS:   Please see the history of present illness.    *** All other systems reviewed and are negative.  EKGs/Labs/Other Studies Reviewed:    The following studies were reviewed today: ***  EKG:  EKG is *** ordered today.  The ekg ordered today demonstrates ***  Recent Labs: No results found for requested labs within last 365 days.  Recent Lipid Panel No results found for: "CHOL", "TRIG", "HDL", "CHOLHDL", "VLDL", "LDLCALC", "LDLDIRECT"   Risk Assessment/Calculations:   {Does this patient have ATRIAL FIBRILLATION?[phone]}  No BP recorded.  {Refresh Note OR Click here to enter BP  :1}***         Physical Exam:    VS:  There were no vitals taken for this visit.    Wt Readings from Last 3 Encounters:  01/03/22 162 lb 14.4 oz (73.9 kg)  09/07/21 171 lb 3.2 oz (77.7 kg)  04/13/21 165 lb (74.8 kg)     GEN: *** Well nourished, well developed in no acute distress HEENT: Normal NECK: No JVD; No carotid bruits LYMPHATICS: No lymphadenopathy CARDIAC: ***RRR, no murmurs, rubs, gallops RESPIRATORY:  Clear to auscultation without rales, wheezing or rhonchi  ABDOMEN: Soft, non-tender, non-distended MUSCULOSKELETAL:  No edema; No deformity  SKIN: Warm and dry NEUROLOGIC:  Alert and oriented x 3 PSYCHIATRIC:  Normal affect   ASSESSMENT:    No diagnosis found. PLAN:    In order of problems listed above:  ***      {Are you ordering a CV Procedure (e.g. stress test, cath, DCCV, TEE, etc)?   Press F2        :[id number]    Medication Adjustments/Labs and Tests Ordered: Current medicines are reviewed at length with the patient today.  Concerns regarding medicines are outlined above.  No orders of the defined types were placed in this encounter.  No orders of the defined types were placed in this encounter.   There are no Patient Instructions on file for this  visit.   Signed, Finis Bud, NP  08/21/2022 8:27 AM    [place]

## 2022-08-23 ENCOUNTER — Other Ambulatory Visit: Payer: Self-pay | Admitting: Cardiology

## 2022-08-28 ENCOUNTER — Ambulatory Visit: Payer: Medicare HMO | Attending: Nurse Practitioner | Admitting: Nurse Practitioner

## 2022-08-28 ENCOUNTER — Encounter: Payer: Self-pay | Admitting: Nurse Practitioner

## 2022-08-28 VITALS — BP 126/76 | HR 85 | Ht 65.0 in | Wt 150.0 lb

## 2022-08-28 DIAGNOSIS — I10 Essential (primary) hypertension: Secondary | ICD-10-CM

## 2022-08-28 DIAGNOSIS — Z87898 Personal history of other specified conditions: Secondary | ICD-10-CM

## 2022-08-28 DIAGNOSIS — I442 Atrioventricular block, complete: Secondary | ICD-10-CM | POA: Diagnosis not present

## 2022-08-28 DIAGNOSIS — E785 Hyperlipidemia, unspecified: Secondary | ICD-10-CM

## 2022-08-28 DIAGNOSIS — Z79899 Other long term (current) drug therapy: Secondary | ICD-10-CM

## 2022-08-28 DIAGNOSIS — I480 Paroxysmal atrial fibrillation: Secondary | ICD-10-CM

## 2022-08-28 DIAGNOSIS — Z950 Presence of cardiac pacemaker: Secondary | ICD-10-CM

## 2022-08-28 MED ORDER — AMLODIPINE BESYLATE 2.5 MG PO TABS: 2.5000 mg | ORAL_TABLET | Freq: Every day | ORAL | 1 refills | Status: DC

## 2022-08-28 NOTE — Patient Instructions (Signed)
Medication Instructions:  STOP Amlodipine 5 mg  START Amlodipine 2.5 mg daily  Labwork: CBC,CMEt,LIPIDS  Testing/Procedures: None today  Follow-Up: 1 year Dr.Branch  Any Other Special Instructions Will Be Listed Below (If Applicable).  If you need a refill on your cardiac medications before your next appointment, please call your pharmacy.

## 2022-08-28 NOTE — Progress Notes (Signed)
Office Visit    Patient Name: Jason Yu Date of Encounter: 08/28/2022  PCP:  Luciano Cutter, [facility]  Cardiologist:  Carlyle Dolly, MD  Advanced Practice Provider:  Finis Bud, NP Electrophysiologist:  Melida Quitter, MD  [phone]  Chief Complaint    Jason Yu is a 72 y.o. male with a hx of hypertension, CHB, s/p PPM in 2004, hyperlipidemia, history of syncope, asthma, and anxiety who presents today for 1 year follow-up.  Past Medical History    Past Medical History:  Diagnosis Date   Anxiety    Asthma    hasn't used inhaler at least 4-5 months.   Complete heart block [facility])    with prior syncope s/p PPM 2004 in [place] (MDT [place]);  08/16/2011 PPM Gen. Change: MDT [place], [id number] H.   Complication of anesthesia 05/02/2011   had difficulty breathing after surgery=C8 nerve root compression   DDD (degenerative disc disease)    with multiple prior back surgeries    GERD (gastroesophageal reflux disease)    Hepatitis    Hx Hepatitis A & B   HLD (hyperlipidemia)    HTN (hypertension), benign    Pacemaker    Pneumonia last 1998   has had 8x   PONV (postoperative nausea and vomiting)    Preoperative cardiovascular examination 08/12/2011   Shortness of breath    Syncope    a.  2001 - normal cath;  b. 2012 Normal Myoview, EF 58%.   Past Surgical History:  Procedure Laterality Date   BACK SURGERY  08/2009, 09/2011   CARDIAC CATHETERIZATION  2001   no significant CAD   CARPAL TUNNEL RELEASE  1979   cervical neck surgeries     INSERT / REPLACE / REMOVE PACEMAKER  08/2011   initially inserted 12/2002   pacemaker generator change  08/16/11   Medtronic Adapta L implanted by Dr Rayann Heman, lead implangted 2004 in [place] Left 08/16/2011   Procedure: [place];  Surgeon: Thompson Grayer, MD;  Location: [facility] CATH LAB;  Service: Cardiovascular;  Laterality: Left;   PACEMAKER  INSERTION     Implanted 2004 in [place]  2009   right   SHOULDER ARTHROSCOPY DISTAL CLAVICLE EXCISION AND OPEN ROTATOR CUFF REPAIR  2010   Left   ULNAR TUNNEL RELEASE  1980's(late) or early 1990's    Allergies  Allergies  Allergen Reactions   Other Other (See Comments)    Nuclear Stress Medicine- made him feel" out of touch" w/ reality.   Adhesive [Tape] Other (See Comments)    Pulls skin off, Please use "paper" tape   Codeine Itching and Rash   Prednisone Other (See Comments)      pain in joints, high doses    History of Present Illness    Jason Yu is a 72 y.o. male with PMH as mentioned above.   Last seen in the office by Dr. Carlyle Dolly on April 13, 2021.  Was doing well at that time.  Today he is here for 1 year follow-up. He states he is doing well from a cardiac perspective. Does admit to chronic MSK issues and poor appetite. Denies any chest pain, shortness of breath, palpitations, syncope, presyncope, dizziness, orthopnea, PND, swelling or significant weight changes, acute bleeding, or claudication.  EKGs/Labs/Other Studies Reviewed:   The following studies were reviewed today:  EKG:  EKG is ordered today.  The ekg ordered today demonstrates V-paced rhythm, 85 bpm, otherwise nothing acute.   Recent Labs: No results found for requested labs within last 365 days.  Recent Lipid Panel No results found for: "CHOL", "TRIG", "HDL", "CHOLHDL", "VLDL", "LDLCALC", "LDLDIRECT"  Risk Assessment/Calculations:   The 10-year ASCVD risk score (Arnett DK, et al., 2019) is: 19.5%   Values used to calculate the score:     Age: 30 years     Sex: Male     Is Non-Hispanic African American: No     Diabetic: No     Tobacco smoker: No     Systolic Blood Pressure: 123XX123 mmHg     Is BP treated: Yes     HDL Cholesterol: 40 mg/dL     Total Cholesterol: 150 mg/dL  Home Medications   Current  Meds  Medication Sig   Ascorbic Acid (VITAMIN C) 1000 MG tablet Take 1,000 mg by mouth daily.     atorvastatin (LIPITOR) 40 MG tablet TAKE 1 TABLET BY MOUTH DAILY   BREO ELLIPTA 200-25 MCG/INH AEPB inhale one PUFF into lungs daily   Cholecalciferol (VITAMIN D) 2000 UNITS CAPS Take 2,000 Units by mouth daily.    co-enzyme Q-10 30 MG capsule Take 30 mg by mouth daily.   diazepam (VALIUM) 10 MG tablet Take 20 mg by mouth daily as needed. For anxiety   Flaxseed, Linseed, (FLAX SEED OIL) 1000 MG CAPS Take 4,000 mg by mouth daily.   hydrochlorothiazide (HYDRODIURIL) 25 MG tablet TAKE 1 TABLET BY MOUTH DAILY   ibuprofen (ADVIL) 600 MG tablet Take 1 tablet by mouth as needed.   meclizine (ANTIVERT) 12.5 MG tablet Take 12.5 mg by mouth 3 (three) times daily as needed for dizziness.   Menthol, Topical Analgesic, 2.5 % LIQD Apply 1 application topically 3 (three) times daily as needed (for muscle pain).   Multiple Vitamin (MULTIVITAMIN) tablet Take 1 tablet by mouth daily.   omeprazole (PRILOSEC) 20 MG capsule Take 20 mg by mouth 2 (two) times daily as needed (for heartburn or acid reflux).    OVER THE COUNTER MEDICATION CBD oil as needed for pain   PARoxetine (PAXIL) 20 MG tablet Take 20 mg by mouth daily.    SPIRIVA HANDIHALER 18 MCG inhalation capsule Place 1 capsule into inhaler and inhale daily.   UNABLE TO FIND CBD oil as needed   VENTOLIN HFA 108 (90 Base) MCG/ACT inhaler INHALE TWO PUFFS BY MOUTH EVERY 6 HOURS AS NEEDED SHORTNESS OF BREATH   vitamin B-12 (CYANOCOBALAMIN) 1000 MCG tablet Take 1,000 mcg by mouth daily.   vitamin E 1000 UNIT capsule Take 1,000 Units by mouth daily.   Zinc 50 MG CAPS Take by mouth.   amLODipine (NORVASC) 10 MG tablet TAKE 1/2 TABLET BY MOUTH IN THE MORNING and TAKE 1/2 TABLET EVERY EVENING (CUT IN HALF)     Review of Systems    All other systems reviewed and are otherwise negative except as noted above.  Physical Exam    VS:  BP 126/76   Pulse 85   Ht 5'  5" (1.651 m)   Wt 150 lb (68 kg)   SpO2 96%   BMI 24.96 kg/m  , BMI Body mass index is 24.96 kg/m.  Wt Readings from Last 3 Encounters:  08/28/22 150 lb (68 kg)  01/03/22 162 lb 14.4 oz (73.9 kg)  09/07/21 171 lb 3.2 oz (77.7 kg)     GEN: Well nourished, well developed,  in no acute distress. HEENT: normal. Neck: Supple, no JVD, carotid bruits, or masses. Cardiac: S1/S2, RRR, no murmurs, rubs, or gallops. No clubbing, cyanosis, edema.  Radials/PT 2+ and equal bilaterally.  Respiratory:  Respirations regular and unlabored, clear to auscultation bilaterally. GI: Soft, nontender, nondistended. MS: No deformity or atrophy. Skin: Warm and dry, no rash. Neuro:  Strength and sensation are intact. Psych: Normal affect.  Assessment & Plan    HTN Blood pressure stable. Discussed to monitor BP at home at least 2 hours after medications and sitting for 5-10 minutes.  Will send refill in for amlodipine 2.5 mg daily.  Continue current medication regimen. Heart healthy diet and regular cardiovascular exercise encouraged. Will obtain CMET.   HLD Due for labs. Will obtain CMET and lipid panel.  Continue current medication regimen. Heart healthy diet and regular cardiovascular exercise encouraged.   CHB, s/p PPM Recent remote device check revealed normal device function.  Continue to follow-up with the EP. Heart healthy diet and regular cardiovascular exercise encouraged.   Hx of syncope Denies any symptoms. Will obtain labs including CBC, CMET, and lipid panel. Heart healthy diet and regular cardiovascular exercise encouraged. Continue to follow with PCP.   Disposition: Follow up in 6 months with Carlyle Dolly, MD or APP.  Signed, Finis Bud, NP 09/01/2022, 10:37 PM [place]

## 2022-08-30 ENCOUNTER — Ambulatory Visit: Payer: Medicare HMO | Admitting: Cardiovascular Disease

## 2022-08-30 ENCOUNTER — Encounter: Payer: Medicare HMO | Admitting: Internal Medicine

## 2022-09-18 NOTE — Progress Notes (Signed)
Remote pacemaker transmission.   

## 2022-11-04 ENCOUNTER — Ambulatory Visit (INDEPENDENT_AMBULATORY_CARE_PROVIDER_SITE_OTHER): Payer: Medicare HMO

## 2022-11-04 DIAGNOSIS — I442 Atrioventricular block, complete: Secondary | ICD-10-CM

## 2022-11-06 LAB — CUP PACEART REMOTE DEVICE CHECK
Battery Impedance: 2738 Ohm
Battery Remaining Longevity: 21 mo
Battery Voltage: 2.73 V
Brady Statistic AP VP Percent: 37 %
Brady Statistic AP VS Percent: 0 %
Brady Statistic AS VP Percent: 63 %
Brady Statistic AS VS Percent: 0 %
Date Time Interrogation Session: 20240508143213
Implantable Lead Connection Status: 753985
Implantable Lead Connection Status: 753985
Implantable Lead Implant Date: 20040709
Implantable Lead Implant Date: 20040709
Implantable Lead Location: 753859
Implantable Lead Location: 753860
Implantable Lead Model: 5076
Implantable Lead Model: 5076
Implantable Pulse Generator Implant Date: 20130215
Lead Channel Impedance Value: 445 Ohm
Lead Channel Impedance Value: 560 Ohm
Lead Channel Pacing Threshold Amplitude: 0.625 V
Lead Channel Pacing Threshold Amplitude: 1.375 V
Lead Channel Pacing Threshold Pulse Width: 0.4 ms
Lead Channel Pacing Threshold Pulse Width: 0.4 ms
Lead Channel Setting Pacing Amplitude: 2 V
Lead Channel Setting Pacing Amplitude: 2.75 V
Lead Channel Setting Pacing Pulse Width: 0.4 ms
Lead Channel Setting Sensing Sensitivity: 5.6 mV
Zone Setting Status: 755011
Zone Setting Status: 755011

## 2022-11-22 ENCOUNTER — Other Ambulatory Visit: Payer: Self-pay | Admitting: Cardiology

## 2022-12-02 NOTE — Progress Notes (Signed)
Remote pacemaker transmission.   

## 2022-12-13 ENCOUNTER — Ambulatory Visit: Payer: Medicare HMO | Admitting: Cardiovascular Disease

## 2022-12-16 ENCOUNTER — Other Ambulatory Visit: Payer: Self-pay | Admitting: Nurse Practitioner

## 2023-01-03 ENCOUNTER — Ambulatory Visit: Payer: Medicare HMO | Attending: Cardiovascular Disease | Admitting: Cardiovascular Disease

## 2023-01-06 ENCOUNTER — Encounter: Payer: Self-pay | Admitting: Cardiovascular Disease

## 2023-02-03 ENCOUNTER — Ambulatory Visit: Payer: Medicare HMO

## 2023-02-03 DIAGNOSIS — I442 Atrioventricular block, complete: Secondary | ICD-10-CM | POA: Diagnosis not present

## 2023-02-07 LAB — CUP PACEART REMOTE DEVICE CHECK
Battery Impedance: 3056 Ohm
Battery Remaining Longevity: 16 mo
Battery Voltage: 2.72 V
Brady Statistic AP VP Percent: 38 %
Brady Statistic AP VS Percent: 0 %
Brady Statistic AS VP Percent: 62 %
Brady Statistic AS VS Percent: 0 %
Date Time Interrogation Session: 20240809090938
Implantable Lead Connection Status: 753985
Implantable Lead Connection Status: 753985
Implantable Lead Implant Date: 20040709
Implantable Lead Implant Date: 20040709
Implantable Lead Location: 753859
Implantable Lead Location: 753860
Implantable Lead Model: 5076
Implantable Lead Model: 5076
Implantable Pulse Generator Implant Date: 20130215
Lead Channel Impedance Value: 397 Ohm
Lead Channel Impedance Value: 522 Ohm
Lead Channel Pacing Threshold Amplitude: 0.625 V
Lead Channel Pacing Threshold Amplitude: 1.5 V
Lead Channel Pacing Threshold Pulse Width: 0.4 ms
Lead Channel Pacing Threshold Pulse Width: 0.4 ms
Lead Channel Setting Pacing Amplitude: 2 V
Lead Channel Setting Pacing Amplitude: 3 V
Lead Channel Setting Pacing Pulse Width: 0.4 ms
Lead Channel Setting Sensing Sensitivity: 5.6 mV
Zone Setting Status: 755011
Zone Setting Status: 755011

## 2023-02-14 NOTE — Progress Notes (Signed)
Remote pacemaker transmission.   

## 2023-03-05 ENCOUNTER — Other Ambulatory Visit: Payer: Self-pay | Admitting: Nurse Practitioner

## 2023-04-04 ENCOUNTER — Ambulatory Visit: Payer: Medicare HMO | Attending: Cardiovascular Disease | Admitting: Cardiovascular Disease

## 2023-04-04 ENCOUNTER — Encounter: Payer: Medicare HMO | Admitting: Cardiovascular Disease

## 2023-04-04 ENCOUNTER — Encounter: Payer: Self-pay | Admitting: Cardiovascular Disease

## 2023-04-04 VITALS — BP 134/74 | HR 59 | Ht 64.0 in | Wt 149.0 lb

## 2023-04-04 DIAGNOSIS — I480 Paroxysmal atrial fibrillation: Secondary | ICD-10-CM | POA: Diagnosis not present

## 2023-04-04 DIAGNOSIS — Z950 Presence of cardiac pacemaker: Secondary | ICD-10-CM | POA: Diagnosis not present

## 2023-04-04 DIAGNOSIS — I442 Atrioventricular block, complete: Secondary | ICD-10-CM | POA: Diagnosis not present

## 2023-04-04 LAB — CUP PACEART INCLINIC DEVICE CHECK
Battery Impedance: 3466 Ohm
Battery Remaining Longevity: 14 mo
Battery Voltage: 2.7 V
Brady Statistic AP VP Percent: 38 %
Brady Statistic AP VS Percent: 0 %
Brady Statistic AS VP Percent: 62 %
Brady Statistic AS VS Percent: 0 %
Date Time Interrogation Session: 20241004164604
Implantable Lead Connection Status: 753985
Implantable Lead Connection Status: 753985
Implantable Lead Implant Date: 20040709
Implantable Lead Implant Date: 20040709
Implantable Lead Location: 753859
Implantable Lead Location: 753860
Implantable Lead Model: 5076
Implantable Lead Model: 5076
Implantable Pulse Generator Implant Date: 20130215
Lead Channel Impedance Value: 408 Ohm
Lead Channel Impedance Value: 506 Ohm
Lead Channel Pacing Threshold Amplitude: 0.625 V
Lead Channel Pacing Threshold Amplitude: 1.375 V
Lead Channel Pacing Threshold Pulse Width: 0.4 ms
Lead Channel Pacing Threshold Pulse Width: 0.4 ms
Lead Channel Sensing Intrinsic Amplitude: 2.8 mV
Lead Channel Setting Pacing Amplitude: 2 V
Lead Channel Setting Pacing Amplitude: 2.75 V
Lead Channel Setting Pacing Pulse Width: 0.4 ms
Lead Channel Setting Sensing Sensitivity: 5.6 mV
Zone Setting Status: 755011
Zone Setting Status: 755011

## 2023-04-04 NOTE — Progress Notes (Signed)
    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  Since last being seen in our clinic, the patient reports doing reasonably well.    He has chronic SOB due to COPD.  This appears stable.    Today, he denies symptoms of palpitations, chest pain, lower extremity edema, dizziness, presyncope, or syncope.  he has no device related complaints -- no new tenderness, drainage, redness.     Physical Exam: Vitals:   04/04/23 1127  BP: 134/74  Pulse: (!) 59  SpO2: 91%  Weight: 149 lb (67.6 kg)  Height: 5\' 4"  (1.626 m)    Gen: Appears comfortable, well-nourished CV: RRR, no dependent edema The device site is normal -- no tenderness, edema, drainage, redness, threatened erosion.  Pulm: breathing easily   Pacemaker interrogation- reviewed in detail today,  See PACEART report  ekg tracing ordered today is personally reviewed and shows sinus with V pacing  Assessment and Plan:  1. Symptomatic complete heart block Normal pacemaker function See Pace Art report No changes today he is device dependant today  2. HTN Stable No change required today  3. Afib Discovered on PPM -- no events this interrogation Longest episode 11 minutes,  burden <0.1% Continue to monitor for now.  I would not consider OAC unless his burden increases    Return in a year  Maurice Small, MD 04/04/2023 11:46 AM

## 2023-04-04 NOTE — Patient Instructions (Addendum)
Medication Instructions:  Continue all current medications.  Labwork: none  Testing/Procedures: none  Follow-Up: 1 year   Any Other Special Instructions Will Be Listed Below (If Applicable).  If you need a refill on your cardiac medications before your next appointment, please call your pharmacy.  

## 2023-05-05 ENCOUNTER — Ambulatory Visit: Payer: Medicare HMO

## 2023-05-05 ENCOUNTER — Telehealth: Payer: Self-pay

## 2023-05-05 NOTE — Telephone Encounter (Signed)
I ordered the patient a new monitor. He should receive it in 7-10 business days. 

## 2023-06-02 ENCOUNTER — Other Ambulatory Visit: Payer: Self-pay | Admitting: Cardiology

## 2023-06-02 DIAGNOSIS — J449 Chronic obstructive pulmonary disease, unspecified: Secondary | ICD-10-CM | POA: Diagnosis not present

## 2023-06-02 DIAGNOSIS — F419 Anxiety disorder, unspecified: Secondary | ICD-10-CM | POA: Diagnosis not present

## 2023-06-02 DIAGNOSIS — R7303 Prediabetes: Secondary | ICD-10-CM | POA: Diagnosis not present

## 2023-06-02 DIAGNOSIS — I10 Essential (primary) hypertension: Secondary | ICD-10-CM | POA: Diagnosis not present

## 2023-06-02 DIAGNOSIS — E785 Hyperlipidemia, unspecified: Secondary | ICD-10-CM | POA: Diagnosis not present

## 2023-07-03 DIAGNOSIS — Z1331 Encounter for screening for depression: Secondary | ICD-10-CM | POA: Diagnosis not present

## 2023-07-03 DIAGNOSIS — M5136 Other intervertebral disc degeneration, lumbar region with discogenic back pain only: Secondary | ICD-10-CM | POA: Diagnosis not present

## 2023-07-03 DIAGNOSIS — E785 Hyperlipidemia, unspecified: Secondary | ICD-10-CM | POA: Diagnosis not present

## 2023-07-03 DIAGNOSIS — I10 Essential (primary) hypertension: Secondary | ICD-10-CM | POA: Diagnosis not present

## 2023-07-03 DIAGNOSIS — J449 Chronic obstructive pulmonary disease, unspecified: Secondary | ICD-10-CM | POA: Diagnosis not present

## 2023-07-03 DIAGNOSIS — Z0001 Encounter for general adult medical examination with abnormal findings: Secondary | ICD-10-CM | POA: Diagnosis not present

## 2023-07-03 DIAGNOSIS — R7303 Prediabetes: Secondary | ICD-10-CM | POA: Diagnosis not present

## 2023-07-03 DIAGNOSIS — Z1389 Encounter for screening for other disorder: Secondary | ICD-10-CM | POA: Diagnosis not present

## 2023-08-04 ENCOUNTER — Ambulatory Visit: Payer: Medicare HMO

## 2023-08-04 DIAGNOSIS — I480 Paroxysmal atrial fibrillation: Secondary | ICD-10-CM

## 2023-08-04 DIAGNOSIS — I442 Atrioventricular block, complete: Secondary | ICD-10-CM

## 2023-08-06 LAB — CUP PACEART REMOTE DEVICE CHECK
Battery Impedance: 5075 Ohm
Battery Remaining Longevity: 3 mo
Battery Voltage: 2.65 V
Brady Statistic AP VP Percent: 52 %
Brady Statistic AP VS Percent: 0 %
Brady Statistic AS VP Percent: 48 %
Brady Statistic AS VS Percent: 0 %
Date Time Interrogation Session: 20250205124356
Implantable Lead Connection Status: 753985
Implantable Lead Connection Status: 753985
Implantable Lead Implant Date: 20040709
Implantable Lead Implant Date: 20040709
Implantable Lead Location: 753859
Implantable Lead Location: 753860
Implantable Lead Model: 5076
Implantable Lead Model: 5076
Implantable Pulse Generator Implant Date: 20130215
Lead Channel Impedance Value: 409 Ohm
Lead Channel Impedance Value: 535 Ohm
Lead Channel Pacing Threshold Amplitude: 0.5 V
Lead Channel Pacing Threshold Amplitude: 1.5 V
Lead Channel Pacing Threshold Pulse Width: 0.4 ms
Lead Channel Pacing Threshold Pulse Width: 0.4 ms
Lead Channel Setting Pacing Amplitude: 2 V
Lead Channel Setting Pacing Amplitude: 3 V
Lead Channel Setting Pacing Pulse Width: 0.4 ms
Lead Channel Setting Sensing Sensitivity: 5.6 mV
Zone Setting Status: 755011
Zone Setting Status: 755011

## 2023-08-07 ENCOUNTER — Telehealth: Payer: Self-pay

## 2023-08-07 NOTE — Telephone Encounter (Signed)
 Increased pacemaker remote checks to monthly. Need to call patient to advise of dates below.   09/08/23 10/09/23 11/10/23 12/11/23 01/12/24

## 2023-08-07 NOTE — Telephone Encounter (Signed)
 Patient called and given dates. Advised if he has further questions or concerns.

## 2023-08-12 NOTE — Telephone Encounter (Signed)
error 

## 2023-08-13 ENCOUNTER — Encounter: Payer: Self-pay | Admitting: Cardiovascular Disease

## 2023-08-15 ENCOUNTER — Telehealth: Payer: Self-pay

## 2023-08-15 NOTE — Telephone Encounter (Signed)
The pt wants to know if he needs a driver when he get his pacemaker changed.I told him I will get Dr. Nelly Laurence scheduler to give him a call back.

## 2023-08-18 DIAGNOSIS — J441 Chronic obstructive pulmonary disease with (acute) exacerbation: Secondary | ICD-10-CM | POA: Diagnosis not present

## 2023-08-26 ENCOUNTER — Encounter: Payer: Self-pay | Admitting: *Deleted

## 2023-08-27 ENCOUNTER — Encounter: Payer: Self-pay | Admitting: Cardiology

## 2023-08-27 ENCOUNTER — Ambulatory Visit: Payer: Medicare HMO | Attending: Cardiology | Admitting: Cardiology

## 2023-08-27 VITALS — BP 120/74 | HR 77 | Ht 64.0 in | Wt 140.0 lb

## 2023-08-27 DIAGNOSIS — E785 Hyperlipidemia, unspecified: Secondary | ICD-10-CM

## 2023-08-27 DIAGNOSIS — I10 Essential (primary) hypertension: Secondary | ICD-10-CM | POA: Diagnosis not present

## 2023-08-27 DIAGNOSIS — I442 Atrioventricular block, complete: Secondary | ICD-10-CM | POA: Diagnosis not present

## 2023-08-27 DIAGNOSIS — I480 Paroxysmal atrial fibrillation: Secondary | ICD-10-CM

## 2023-08-27 NOTE — Progress Notes (Signed)
 Clinical Summary [name] is a 73 y.o.male seen today for follow up of the following medical problems.    1. HTN - compliant with meds   2. Complete heart block   - s/p pacemaker placement 2004   - followed by EP - 08/2023 normal device check     3. Hyperlipidemia  12/2022 TC 121 TG 40 HDL 49 LDL 64 - he is compliant with meds atorvastatin 40mg  daily.    4. PAF - from Jason Jason Yu note noted by pacemaker check, <0.1% burden. Recs to monitor at this time, did not recommend anticoag with this low of a burden.  - EKG A snsed V paced - 08/2023 normal device checked.  - no recent palpitations   Past Medical History:  Diagnosis Date   Anxiety    Asthma    hasn't used inhaler at least 4-5 months.   Complete heart block Jason Yu)    with prior syncope s/p PPM 2004 in [place] (MDT [place]);  08/16/2011 PPM Gen. Change: MDT Adapta L ADDDR1, [id number] H.   Complication of anesthesia 05/02/2011   had difficulty breathing after surgery=C8 nerve root compression   DDD (degenerative disc disease)    with multiple prior back surgeries    GERD (gastroesophageal reflux disease)    Hepatitis    Hx Hepatitis A & B   HLD (hyperlipidemia)    HTN (hypertension), benign    Pacemaker    Pneumonia last 1998   has had 8x   PONV (postoperative nausea and vomiting)    Preoperative cardiovascular examination 08/12/2011   Shortness of breath    Syncope    a.  2001 - normal cath;  b. 2012 Normal Myoview, EF 58%.     Allergies  Allergen Reactions   Other Other (See Comments)    Nuclear Stress Medicine- made him feel" out of touch" w/ reality.   Adhesive [Tape] Other (See Comments)    Pulls skin off, Please use "paper" tape   Codeine Itching and Rash   Prednisone Other (See Comments)      pain in joints, high doses     Current Outpatient Medications  Medication Sig Dispense Refill   amLODipine (NORVASC) 2.5 MG tablet take 1 tablet by mouth daily 90 tablet 0   Ascorbic Acid (VITAMIN  C) 1000 MG tablet Take 1,000 mg by mouth daily.       atorvastatin (LIPITOR) 40 MG tablet take 1 tablet by mouth once daily 90 tablet 0   BREO ELLIPTA 200-25 MCG/INH AEPB inhale one PUFF into lungs daily 60 each 3   Cholecalciferol (VITAMIN D) 2000 UNITS CAPS Take 2,000 Units by mouth daily.      co-enzyme Q-10 30 MG capsule Take 30 mg by mouth daily.     diazepam (VALIUM) 10 MG tablet Take 20 mg by mouth daily as needed. For anxiety     Flaxseed, Linseed, (FLAX SEED OIL) 1000 MG CAPS Take 4,000 mg by mouth daily.     fluticasone-salmeterol (ADVAIR HFA) 115-21 MCG/ACT inhaler Inhale 2 puffs into the lungs 2 (two) times daily.     hydrochlorothiazide (HYDRODIURIL) 25 MG tablet Take 25 mg by mouth daily.     ibuprofen (ADVIL) 600 MG tablet Take 1 tablet by mouth as needed.     meclizine (ANTIVERT) 12.5 MG tablet Take 12.5 mg by mouth 3 (three) times daily as needed for dizziness.     Multiple Vitamin (MULTIVITAMIN) tablet Take 1 tablet by mouth daily.  omeprazole (PRILOSEC) 20 MG capsule Take 20 mg by mouth 2 (two) times daily as needed (for heartburn or acid reflux).      PARoxetine (PAXIL) 20 MG tablet Take 20 mg by mouth daily.      SPIRIVA HANDIHALER 18 MCG inhalation capsule Place 1 capsule into inhaler and inhale daily.     VENTOLIN HFA 108 (90 Base) MCG/ACT inhaler INHALE TWO PUFFS BY MOUTH EVERY 6 HOURS AS NEEDED SHORTNESS OF BREATH 18 g 0   vitamin B-12 (CYANOCOBALAMIN) 1000 MCG tablet Take 1,000 mcg by mouth daily.     vitamin E 1000 UNIT capsule Take 1,000 Units by mouth daily.     Zinc 50 MG CAPS Take by mouth.     No current facility-administered medications for this visit.     Past Surgical History:  Procedure Laterality Date   BACK SURGERY  08/2009, 09/2011   CARDIAC CATHETERIZATION  2001   no significant CAD   CARPAL TUNNEL RELEASE  1979   cervical neck surgeries     INSERT / REPLACE / REMOVE PACEMAKER  08/2011   initially inserted 12/2002   pacemaker generator change   08/16/11   [employer] Adapta L implanted by Jason Yu, lead implangted 2004 in [place]   PACEMAKER GENERATOR CHANGE Left 08/16/2011   Procedure: PACEMAKER GENERATOR CHANGE;  Surgeon: Jason Range, MD;  Location: [facility] CATH LAB;  Service: Cardiovascular;  Laterality: Left;   PACEMAKER INSERTION     Implanted 2004 in [place]   SHOULDER ARTHROSCOPY DISTAL CLAVICLE EXCISION AND OPEN ROTATOR CUFF REPAIR  2009   right   SHOULDER ARTHROSCOPY DISTAL CLAVICLE EXCISION AND OPEN ROTATOR CUFF REPAIR  2010   Left   ULNAR TUNNEL RELEASE  1980's(late) or early 1990's     Allergies  Allergen Reactions   Other Other (See Comments)    Nuclear Stress Medicine- made him feel" out of touch" w/ reality.   Adhesive [Tape] Other (See Comments)    Pulls skin off, Please use "paper" tape   Codeine Itching and Rash   Prednisone Other (See Comments)      pain in joints, high doses      Family History  Problem Relation Age of Onset   Diabetes Neg Hx    Hypertension Neg Hx    Coronary artery disease Neg Hx      Social History Jason Yu reports that he quit smoking about 45 years ago. His smoking use included cigarettes. He started smoking about 55 years ago. He has a 18.8 pack-year smoking history. He has never used smokeless tobacco. Jason Yu reports no history of alcohol use.     Physical Examination Vitals:   08/27/23 1551  BP: 120/74  Pulse: 77  SpO2: 98%   Filed Weights   08/27/23 1551  Weight: 140 lb (63.5 kg)    Gen: resting comfortably, no acute distress HEENT: no scleral icterus, pupils equal round and reactive, no palptable cervical adenopathy,  CV: RRR, no mrg, no jvd Resp: Clear to auscultation bilaterally GI: abdomen is soft, non-tender, non-distended, normal bowel sounds, no hepatosplenomegaly MSK: extremities are warm, no edema.  Skin: warm, no rash Neuro:  no focal deficits Psych: appropriate affect    Assessment and Plan    1. HTN - - at goal, continue current  meds   2. Complete heart block/pacemaker - pacemaker followed by pcp - no symptoms - EKG today shows A sensed V paced - continue to monitor     3. Hyperlipidemia - at  goal, continue current meds  4. PAF - very low burden on device check, has not been committed to anticoag - followed by EP - continue to monitor  F/u 1 year  Antoine Poche, M.D.

## 2023-08-27 NOTE — Patient Instructions (Addendum)

## 2023-08-28 ENCOUNTER — Other Ambulatory Visit: Payer: Self-pay | Admitting: Cardiology

## 2023-09-08 ENCOUNTER — Ambulatory Visit (INDEPENDENT_AMBULATORY_CARE_PROVIDER_SITE_OTHER): Payer: Medicare HMO

## 2023-09-08 DIAGNOSIS — I442 Atrioventricular block, complete: Secondary | ICD-10-CM

## 2023-09-08 LAB — CUP PACEART REMOTE DEVICE CHECK
Battery Impedance: 5363 Ohm
Battery Remaining Longevity: 1 mo
Battery Voltage: 2.63 V
Brady Statistic AP VP Percent: 50 %
Brady Statistic AP VS Percent: 0 %
Brady Statistic AS VP Percent: 50 %
Brady Statistic AS VS Percent: 0 %
Date Time Interrogation Session: 20250310092105
Implantable Lead Connection Status: 753985
Implantable Lead Connection Status: 753985
Implantable Lead Implant Date: 20040709
Implantable Lead Implant Date: 20040709
Implantable Lead Location: 753859
Implantable Lead Location: 753860
Implantable Lead Model: 5076
Implantable Lead Model: 5076
Implantable Pulse Generator Implant Date: 20130215
Lead Channel Impedance Value: 418 Ohm
Lead Channel Impedance Value: 508 Ohm
Lead Channel Pacing Threshold Amplitude: 0.5 V
Lead Channel Pacing Threshold Amplitude: 1.5 V
Lead Channel Pacing Threshold Pulse Width: 0.4 ms
Lead Channel Pacing Threshold Pulse Width: 0.4 ms
Lead Channel Setting Pacing Amplitude: 2 V
Lead Channel Setting Pacing Amplitude: 3 V
Lead Channel Setting Pacing Pulse Width: 0.4 ms
Lead Channel Setting Sensing Sensitivity: 5.6 mV
Zone Setting Status: 755011
Zone Setting Status: 755011

## 2023-09-09 NOTE — Progress Notes (Signed)
 Remote pacemaker transmission.

## 2023-09-12 ENCOUNTER — Telehealth: Payer: Self-pay

## 2023-09-12 NOTE — Telephone Encounter (Signed)
 Spoke with patient, office visit with Dr Nelly Laurence to discuss gen change on 3/25, holding spot on 4/1. Patient will have labs completed at 3/25 office visit.

## 2023-09-23 ENCOUNTER — Ambulatory Visit: Attending: Cardiovascular Disease | Admitting: Cardiovascular Disease

## 2023-09-23 ENCOUNTER — Encounter: Payer: Self-pay | Admitting: Cardiovascular Disease

## 2023-09-23 VITALS — BP 134/80 | HR 61 | Ht 64.0 in | Wt 144.0 lb

## 2023-09-23 DIAGNOSIS — I480 Paroxysmal atrial fibrillation: Secondary | ICD-10-CM

## 2023-09-23 DIAGNOSIS — I442 Atrioventricular block, complete: Secondary | ICD-10-CM

## 2023-09-23 LAB — CUP PACEART INCLINIC DEVICE CHECK
Battery Impedance: 5916 Ohm
Battery Remaining Longevity: 1 mo — CL
Battery Voltage: 2.62 V
Brady Statistic AP VP Percent: 50 %
Brady Statistic AP VS Percent: 0 %
Brady Statistic AS VP Percent: 50 %
Brady Statistic AS VS Percent: 0 %
Date Time Interrogation Session: 20250325154254
Implantable Lead Connection Status: 753985
Implantable Lead Connection Status: 753985
Implantable Lead Implant Date: 20040709
Implantable Lead Implant Date: 20040709
Implantable Lead Location: 753859
Implantable Lead Location: 753860
Implantable Lead Model: 5076
Implantable Lead Model: 5076
Implantable Pulse Generator Implant Date: 20130215
Lead Channel Impedance Value: 438 Ohm
Lead Channel Impedance Value: 543 Ohm
Lead Channel Pacing Threshold Amplitude: 0.5 V
Lead Channel Pacing Threshold Amplitude: 0.5 V
Lead Channel Pacing Threshold Amplitude: 1 V
Lead Channel Pacing Threshold Amplitude: 1.5 V
Lead Channel Pacing Threshold Pulse Width: 0.4 ms
Lead Channel Pacing Threshold Pulse Width: 0.4 ms
Lead Channel Pacing Threshold Pulse Width: 0.4 ms
Lead Channel Pacing Threshold Pulse Width: 0.4 ms
Lead Channel Sensing Intrinsic Amplitude: 4 mV
Lead Channel Setting Pacing Amplitude: 2 V
Lead Channel Setting Pacing Amplitude: 3 V
Lead Channel Setting Pacing Pulse Width: 0.4 ms
Lead Channel Setting Sensing Sensitivity: 5.6 mV
Zone Setting Status: 755011
Zone Setting Status: 755011

## 2023-09-23 NOTE — Progress Notes (Signed)
    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  Since last being seen in our clinic, the patient reports doing reasonably well.    He has chronic SOB due to COPD.  This appears stable.    Today, he denies symptoms of palpitations, chest pain, lower extremity edema, dizziness, presyncope, or syncope.  he has no device related complaints -- no new tenderness, drainage, redness.     Physical Exam: Vitals:   09/23/23 1459  BP: 134/80  Pulse: 61  SpO2: 95%  Weight: 144 lb (65.3 kg)  Height: 5\' 4"  (1.626 m)     Gen: Appears comfortable, well-nourished CV: RRR, no dependent edema The device site is normal -- no tenderness, edema, drainage, redness, threatened erosion.  Pulm: breathing easily   Pacemaker interrogation- reviewed in detail today,  See PACEART report  ekg tracing ordered today is personally reviewed and shows sinus with V pacing  Assessment and Plan:  1. Symptomatic complete heart block Normal pacemaker function  -- battery is at [facility] See Arita Miss Art report he is device dependant today  We discussed the indication, rationale, risks of the generator change procedure.  I explained risks which include but are not limited to infection, damage to existing device leads.  Significant complications such as prolonged hospital stay, need for surgery, or death would be very unlikely.  2. HTN Stable No change required today  3. Afib Discovered on PPM --  Burden <0.1% Continue to monitor for now.  I would not consider OAC unless his burden increases    Return in a year  Maurice Small, MD 09/23/2023 3:09 PM

## 2023-09-23 NOTE — Patient Instructions (Signed)
 Medication Instructions:  Your physician recommends that you continue on your current medications as directed. Please refer to the Current Medication list given to you today. *If you need a refill on your cardiac medications before your next appointment, please call your pharmacy*   Lab Work: CBC and BMET today at [facility] on the first floor If you have labs (blood work) drawn today and your tests are completely normal, you will receive your results only by: MyChart Message (if you have MyChart) OR A paper copy in the mail If you have any lab test that is abnormal or we need to change your treatment, we will call you to review the results.   Testing/Procedures: Pacemaker generator change - see instruction letter   Follow-Up: At [facility], you and your health needs are our priority.  As part of our continuing mission to provide you with exceptional heart care, we have created designated Provider Care Teams.  These Care Teams include your primary Cardiologist (physician) and Advanced Practice Providers (APPs -  Physician Assistants and Nurse Practitioners) who all work together to provide you with the care you need, when you need it.  We recommend signing up for the patient portal called "MyChart".  Sign up information is provided on this After Visit Summary.  MyChart is used to connect with patients for Virtual Visits (Telemedicine).  Patients are able to view lab/test results, encounter notes, upcoming appointments, etc.  Non-urgent messages can be sent to your provider as well.   To learn more about what you can do with MyChart, go to [email or web address].    Your next appointment:   We will schedule follow up after your generator change  Provider:   York Pellant, MD

## 2023-09-23 NOTE — H&P (View-Only) (Signed)
    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  Since last being seen in our clinic, the patient reports doing reasonably well.    He has chronic SOB due to COPD.  This appears stable.    Today, he denies symptoms of palpitations, chest pain, lower extremity edema, dizziness, presyncope, or syncope.  he has no device related complaints -- no new tenderness, drainage, redness.     Physical Exam: Vitals:   09/23/23 1459  BP: 134/80  Pulse: 61  SpO2: 95%  Weight: 144 lb (65.3 kg)  Height: 5\' 4"  (1.626 m)     Gen: Appears comfortable, well-nourished CV: RRR, no dependent edema The device site is normal -- no tenderness, edema, drainage, redness, threatened erosion.  Pulm: breathing easily   Pacemaker interrogation- reviewed in detail today,  See PACEART report  ekg tracing ordered today is personally reviewed and shows sinus with V pacing  Assessment and Plan:  1. Symptomatic complete heart block Normal pacemaker function  -- battery is at [facility] See Arita Miss Art report he is device dependant today  We discussed the indication, rationale, risks of the generator change procedure.  I explained risks which include but are not limited to infection, damage to existing device leads.  Significant complications such as prolonged hospital stay, need for surgery, or death would be very unlikely.  2. HTN Stable No change required today  3. Afib Discovered on PPM --  Burden <0.1% Continue to monitor for now.  I would not consider OAC unless his burden increases    Return in a year  Maurice Small, MD 09/23/2023 3:09 PM

## 2023-09-24 LAB — CBC
Hematocrit: 39.9 % (ref 37.5–51.0)
Hemoglobin: 14.1 g/dL (ref 13.0–17.7)
MCH: 32.3 pg (ref 26.6–33.0)
MCHC: 35.3 g/dL (ref 31.5–35.7)
MCV: 92 fL (ref 79–97)
Platelets: 385 10*3/uL (ref 150–450)
RBC: 4.36 x10E6/uL (ref 4.14–5.80)
RDW: 13.1 % (ref 11.6–15.4)
WBC: 9.7 10*3/uL (ref 3.4–10.8)

## 2023-09-25 LAB — BASIC METABOLIC PANEL WITH GFR
BUN/Creatinine Ratio: 13 (ref 10–24)
BUN: 10 mg/dL (ref 8–27)
CO2: 22 mmol/L (ref 20–29)
Calcium: 9.2 mg/dL (ref 8.6–10.2)
Chloride: 94 mmol/L — ABNORMAL LOW (ref 96–106)
Creatinine, Ser: 0.77 mg/dL (ref 0.76–1.27)
Glucose: 105 mg/dL — ABNORMAL HIGH (ref 70–99)
Potassium: 4.8 mmol/L (ref 3.5–5.2)
Sodium: 135 mmol/L (ref 134–144)
eGFR: 96 mL/min/{1.73_m2} (ref >59)

## 2023-09-29 NOTE — Pre-Procedure Instructions (Signed)
Attempted to call patient regarding procedure instructions.  Left voice mail on the following items: Arrival time 1230 Nothing to eat or drink after midnight No meds AM of procedure Responsible person to drive you home and stay with you for 24 hrs Wash with special soap night before and morning of procedure  

## 2023-09-30 ENCOUNTER — Encounter (HOSPITAL_COMMUNITY): Payer: Self-pay | Admitting: Cardiovascular Disease

## 2023-09-30 ENCOUNTER — Other Ambulatory Visit: Payer: Self-pay

## 2023-09-30 ENCOUNTER — Encounter (HOSPITAL_COMMUNITY)
Admission: RE | Disposition: A | Discharge status: Home / Self Care | Payer: Self-pay | Source: Home / Self Care | Attending: Cardiovascular Disease

## 2023-09-30 ENCOUNTER — Ambulatory Visit (HOSPITAL_COMMUNITY)
Admission: RE | Admit: 2023-09-30 | Discharge: 2023-09-30 | Disposition: A | Discharge status: Home / Self Care | Attending: Cardiovascular Disease | Admitting: Cardiovascular Disease

## 2023-09-30 DIAGNOSIS — J449 Chronic obstructive pulmonary disease, unspecified: Secondary | ICD-10-CM | POA: Diagnosis not present

## 2023-09-30 DIAGNOSIS — I4891 Unspecified atrial fibrillation: Secondary | ICD-10-CM | POA: Diagnosis not present

## 2023-09-30 DIAGNOSIS — I442 Atrioventricular block, complete: Secondary | ICD-10-CM | POA: Diagnosis not present

## 2023-09-30 DIAGNOSIS — Z45010 Encounter for checking and testing of cardiac pacemaker pulse generator [battery]: Secondary | ICD-10-CM | POA: Insufficient documentation

## 2023-09-30 DIAGNOSIS — I10 Essential (primary) hypertension: Secondary | ICD-10-CM | POA: Insufficient documentation

## 2023-09-30 HISTORY — PX: PPM GENERATOR CHANGEOUT: EP1233

## 2023-09-30 SURGERY — PPM GENERATOR CHANGEOUT

## 2023-09-30 MED ORDER — SODIUM CHLORIDE 0.9 % IV SOLN: 80.0000 mg | INTRAVENOUS | Status: AC

## 2023-09-30 MED ORDER — FENTANYL CITRATE (PF) 100 MCG/2ML IJ SOLN
INTRAMUSCULAR | Status: AC
  Filled 2023-09-30: qty 2

## 2023-09-30 MED ORDER — CHLORHEXIDINE GLUCONATE 4 % EX SOLN: 4.0000 | Freq: Once | CUTANEOUS | Status: DC

## 2023-09-30 MED ORDER — LIDOCAINE HCL (PF) 1 % IJ SOLN
INTRAMUSCULAR | Status: AC
  Filled 2023-09-30: qty 60

## 2023-09-30 MED ORDER — CEFAZOLIN SODIUM-DEXTROSE 2-4 GM/100ML-% IV SOLN: 2.0000 g | INTRAVENOUS | Status: AC

## 2023-09-30 MED ORDER — LIDOCAINE HCL (PF) 1 % IJ SOLN
INTRAMUSCULAR | Status: DC | PRN
  Administered 2023-09-30: 40 mL

## 2023-09-30 MED ORDER — POVIDONE-IODINE 10 % EX SWAB
2.0000 | Freq: Once | CUTANEOUS | Status: AC
  Administered 2023-09-30: 2 via TOPICAL

## 2023-09-30 MED ORDER — MIDAZOLAM HCL 5 MG/5ML IJ SOLN
INTRAMUSCULAR | Status: DC | PRN
  Administered 2023-09-30: 1 mg via INTRAVENOUS

## 2023-09-30 MED ORDER — CEFAZOLIN SODIUM-DEXTROSE 2-4 GM/100ML-% IV SOLN
INTRAVENOUS | Status: AC
  Administered 2023-09-30: 2 g via INTRAVENOUS
  Filled 2023-09-30: qty 100

## 2023-09-30 MED ORDER — MIDAZOLAM HCL 5 MG/5ML IJ SOLN
INTRAMUSCULAR | Status: AC
  Filled 2023-09-30: qty 5

## 2023-09-30 MED ORDER — SODIUM CHLORIDE 0.9 % IV SOLN
INTRAVENOUS | Status: AC
  Administered 2023-09-30: 80 mg
  Filled 2023-09-30: qty 2

## 2023-09-30 MED ORDER — SODIUM CHLORIDE 0.9 % IV SOLN: INTRAVENOUS | Status: DC

## 2023-09-30 MED ORDER — FENTANYL CITRATE (PF) 100 MCG/2ML IJ SOLN
INTRAMUSCULAR | Status: DC | PRN
  Administered 2023-09-30: 25 ug via INTRAVENOUS

## 2023-09-30 SURGICAL SUPPLY — 10 items
CABLE SURGICAL S-101-97-12 (CABLE) ×1 IMPLANT
DEVICE DISSECT PLASMABLAD 3.0S (MISCELLANEOUS) IMPLANT
IPG PACE AZUR XT DR MRI W1DR01 (Pacemaker) IMPLANT
KIT WRENCH (KITS) IMPLANT
PACE AZURE XT DR MRI W1DR01 (Pacemaker) ×1 IMPLANT
PAD DEFIB RADIO PHYSIO CONN (PAD) ×1 IMPLANT
PLASMABLADE 3.0S (MISCELLANEOUS) ×1 IMPLANT
POUCH AIGIS-R ANTIBACT PPM (Mesh General) ×1 IMPLANT
POUCH AIGIS-R ANTIBACT PPM MED (Mesh General) IMPLANT
TRAY PACEMAKER INSERTION (PACKS) ×1 IMPLANT

## 2023-09-30 NOTE — Interval H&P Note (Signed)
 History and Physical Interval Note:  09/30/2023 2:15 PM  Jason Yu  has presented today for surgery, with the diagnosis of eri.  The various methods of treatment have been discussed with the patient and family. After consideration of risks, benefits and other options for treatment, the patient has consented to  Procedure(s): PPM GENERATOR CHANGEOUT (N/A) as a surgical intervention.  The patient's history has been reviewed, patient examined, no change in status, stable for surgery.  I have reviewed the patient's chart and labs.  Questions were answered to the patient's satisfaction.     Roberts Gaudy Kerston Landeck

## 2023-09-30 NOTE — Discharge Instructions (Signed)

## 2023-10-02 ENCOUNTER — Encounter: Payer: Self-pay | Admitting: Cardiovascular Disease

## 2023-10-02 ENCOUNTER — Telehealth: Payer: Self-pay

## 2023-10-02 NOTE — Telephone Encounter (Signed)
 Follow-up after same day discharge: Implant date: 09/30/2023 MD: AM Device: ppm  Location: l chest   Wound check visit: yes 90 day MD follow-up: yes  Remote Transmission received:yes  Dressing/sling removed: n/a  Confirm OAC restart on: yes  Please continue to monitor your cardiac device site for redness, swelling, and drainage. Call the device clinic at [phone] if you experience these symptoms, fever/chills, or have questions about your device.   Remote monitoring is used to monitor your cardiac device from home. This monitoring is scheduled every 91 days by our office. It allows [place] to keep an eye on the functioning of your device to ensure it is working properly.

## 2023-10-10 NOTE — Progress Notes (Signed)
 Remote pacemaker transmission.

## 2023-10-15 ENCOUNTER — Ambulatory Visit: Attending: Cardiovascular Disease

## 2023-10-15 DIAGNOSIS — I442 Atrioventricular block, complete: Secondary | ICD-10-CM | POA: Diagnosis not present

## 2023-10-15 LAB — CUP PACEART INCLINIC DEVICE CHECK
Battery Remaining Longevity: 128 mo
Battery Voltage: 3.22 V
Brady Statistic AP VP Percent: 40.77 %
Brady Statistic AP VS Percent: 0 %
Brady Statistic AS VP Percent: 59.09 %
Brady Statistic AS VS Percent: 0.14 %
Brady Statistic RA Percent Paced: 40.81 %
Brady Statistic RV Percent Paced: 99.86 %
Date Time Interrogation Session: 20250416124352
Implantable Lead Connection Status: 753985
Implantable Lead Connection Status: 753985
Implantable Lead Implant Date: 20040709
Implantable Lead Implant Date: 20040709
Implantable Lead Location: 753859
Implantable Lead Location: 753860
Implantable Lead Model: 5076
Implantable Lead Model: 5076
Implantable Pulse Generator Implant Date: 20250401
Lead Channel Impedance Value: 323 Ohm
Lead Channel Impedance Value: 361 Ohm
Lead Channel Impedance Value: 399 Ohm
Lead Channel Impedance Value: 494 Ohm
Lead Channel Pacing Threshold Amplitude: 0.5 V
Lead Channel Pacing Threshold Amplitude: 1.375 V
Lead Channel Pacing Threshold Pulse Width: 0.4 ms
Lead Channel Pacing Threshold Pulse Width: 0.4 ms
Lead Channel Sensing Intrinsic Amplitude: 4.375 mV
Lead Channel Sensing Intrinsic Amplitude: 6.625 mV
Lead Channel Setting Pacing Amplitude: 1.5 V
Lead Channel Setting Pacing Amplitude: 2.75 V
Lead Channel Setting Pacing Pulse Width: 0.4 ms
Lead Channel Setting Sensing Sensitivity: 5.6 mV
Zone Setting Status: 755011
Zone Setting Status: 755011

## 2023-10-15 NOTE — Patient Instructions (Signed)

## 2023-10-15 NOTE — Progress Notes (Signed)
 Normal dual chamber pacemaker wound check. Presenting rhythm: AP/ VP- 60 . Wound well healed. Routine testing performed. Thresholds, sensing, and impedances consistent with previous measurements. Patient programmed to chronic outputs post gen change.  No episodes. Pt enrolled in remote follow-up.

## 2023-10-20 ENCOUNTER — Telehealth: Payer: Self-pay

## 2023-10-20 NOTE — Telephone Encounter (Signed)
 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 He would have syncope episodes. He always been able to work with steel but not aluminum.  I told him I will call [employer] to ask about it and give him a call back.

## 2023-10-21 ENCOUNTER — Encounter: Payer: Self-pay | Admitting: Cardiovascular Disease

## 2023-10-22 NOTE — Telephone Encounter (Signed)
 Ask [employer] if he can have a MRI as well.

## 2023-10-23 NOTE — Telephone Encounter (Signed)
 I spoke with the pt.

## 2023-10-27 DIAGNOSIS — M1389 Other specified arthritis, multiple sites: Secondary | ICD-10-CM | POA: Diagnosis not present

## 2023-10-27 DIAGNOSIS — M5136 Other intervertebral disc degeneration, lumbar region with discogenic back pain only: Secondary | ICD-10-CM | POA: Diagnosis not present

## 2023-10-27 DIAGNOSIS — J449 Chronic obstructive pulmonary disease, unspecified: Secondary | ICD-10-CM | POA: Diagnosis not present

## 2023-10-27 DIAGNOSIS — I10 Essential (primary) hypertension: Secondary | ICD-10-CM | POA: Diagnosis not present

## 2023-11-03 ENCOUNTER — Ambulatory Visit: Payer: Medicare HMO

## 2023-11-17 ENCOUNTER — Ambulatory Visit (INDEPENDENT_AMBULATORY_CARE_PROVIDER_SITE_OTHER)

## 2023-11-17 DIAGNOSIS — I442 Atrioventricular block, complete: Secondary | ICD-10-CM

## 2023-11-18 LAB — CUP PACEART REMOTE DEVICE CHECK
Battery Remaining Longevity: 128 mo
Battery Voltage: 3.21 V
Brady Statistic AP VP Percent: 58.97 %
Brady Statistic AP VS Percent: 0 %
Brady Statistic AS VP Percent: 40.75 %
Brady Statistic AS VS Percent: 0.28 %
Brady Statistic RA Percent Paced: 59.1 %
Brady Statistic RV Percent Paced: 99.72 %
Date Time Interrogation Session: 20250518205922
Implantable Lead Connection Status: 753985
Implantable Lead Connection Status: 753985
Implantable Lead Implant Date: 20040709
Implantable Lead Implant Date: 20040709
Implantable Lead Location: 753859
Implantable Lead Location: 753860
Implantable Lead Model: 5076
Implantable Lead Model: 5076
Implantable Pulse Generator Implant Date: 20250401
Lead Channel Impedance Value: 323 Ohm
Lead Channel Impedance Value: 380 Ohm
Lead Channel Impedance Value: 399 Ohm
Lead Channel Impedance Value: 513 Ohm
Lead Channel Pacing Threshold Amplitude: 0.625 V
Lead Channel Pacing Threshold Amplitude: 1.375 V
Lead Channel Pacing Threshold Pulse Width: 0.4 ms
Lead Channel Pacing Threshold Pulse Width: 0.4 ms
Lead Channel Sensing Intrinsic Amplitude: 5.75 mV
Lead Channel Sensing Intrinsic Amplitude: 5.75 mV
Lead Channel Setting Pacing Amplitude: 1.5 V
Lead Channel Setting Pacing Amplitude: 2.75 V
Lead Channel Setting Pacing Pulse Width: 0.4 ms
Lead Channel Setting Sensing Sensitivity: 5.6 mV
Zone Setting Status: 755011
Zone Setting Status: 755011

## 2023-11-27 ENCOUNTER — Ambulatory Visit: Payer: Self-pay | Admitting: Cardiovascular Disease

## 2023-12-30 NOTE — Progress Notes (Signed)
 This encounter was created in error - please disregard.

## 2023-12-31 ENCOUNTER — Ambulatory Visit: Admitting: Pulmonary Disease

## 2023-12-31 DIAGNOSIS — Z950 Presence of cardiac pacemaker: Secondary | ICD-10-CM

## 2023-12-31 DIAGNOSIS — I442 Atrioventricular block, complete: Secondary | ICD-10-CM

## 2024-01-05 NOTE — Addendum Note (Signed)
 Addended by: TAWNI DRILLING D on: 01/05/2024 12:00 PM   Modules accepted: Orders

## 2024-01-05 NOTE — Progress Notes (Signed)
 Remote pacemaker transmission.

## 2024-01-15 ENCOUNTER — Encounter: Admitting: Student

## 2024-01-21 DIAGNOSIS — J449 Chronic obstructive pulmonary disease, unspecified: Secondary | ICD-10-CM | POA: Diagnosis not present

## 2024-01-21 DIAGNOSIS — I10 Essential (primary) hypertension: Secondary | ICD-10-CM | POA: Diagnosis not present

## 2024-01-21 DIAGNOSIS — B351 Tinea unguium: Secondary | ICD-10-CM | POA: Diagnosis not present

## 2024-01-21 DIAGNOSIS — E785 Hyperlipidemia, unspecified: Secondary | ICD-10-CM | POA: Diagnosis not present

## 2024-01-21 DIAGNOSIS — M1389 Other specified arthritis, multiple sites: Secondary | ICD-10-CM | POA: Diagnosis not present

## 2024-01-27 ENCOUNTER — Ambulatory Visit: Admitting: Student

## 2024-01-30 ENCOUNTER — Encounter: Admitting: Cardiovascular Disease

## 2024-02-02 ENCOUNTER — Ambulatory Visit: Payer: Medicare HMO

## 2024-02-03 ENCOUNTER — Ambulatory Visit: Admitting: Podiatry

## 2024-02-16 ENCOUNTER — Ambulatory Visit (INDEPENDENT_AMBULATORY_CARE_PROVIDER_SITE_OTHER)

## 2024-02-16 DIAGNOSIS — I442 Atrioventricular block, complete: Secondary | ICD-10-CM | POA: Diagnosis not present

## 2024-02-18 LAB — CUP PACEART REMOTE DEVICE CHECK
Battery Remaining Longevity: 124 mo
Battery Voltage: 3.17 V
Brady Statistic AP VP Percent: 45.44 %
Brady Statistic AP VS Percent: 0 %
Brady Statistic AS VP Percent: 54.19 %
Brady Statistic AS VS Percent: 0.38 %
Brady Statistic RA Percent Paced: 45.64 %
Brady Statistic RV Percent Paced: 99.62 %
Date Time Interrogation Session: 20250817215609
Implantable Lead Connection Status: 753985
Implantable Lead Connection Status: 753985
Implantable Lead Implant Date: 20040709
Implantable Lead Implant Date: 20040709
Implantable Lead Location: 753859
Implantable Lead Location: 753860
Implantable Lead Model: 5076
Implantable Lead Model: 5076
Implantable Pulse Generator Implant Date: 20250401
Lead Channel Impedance Value: 304 Ohm
Lead Channel Impedance Value: 361 Ohm
Lead Channel Impedance Value: 361 Ohm
Lead Channel Impedance Value: 494 Ohm
Lead Channel Pacing Threshold Amplitude: 0.75 V
Lead Channel Pacing Threshold Amplitude: 1.375 V
Lead Channel Pacing Threshold Pulse Width: 0.4 ms
Lead Channel Pacing Threshold Pulse Width: 0.4 ms
Lead Channel Sensing Intrinsic Amplitude: 3.25 mV
Lead Channel Sensing Intrinsic Amplitude: 3.25 mV
Lead Channel Setting Pacing Amplitude: 1.5 V
Lead Channel Setting Pacing Amplitude: 2.75 V
Lead Channel Setting Pacing Pulse Width: 0.4 ms
Lead Channel Setting Sensing Sensitivity: 5.6 mV
Zone Setting Status: 755011
Zone Setting Status: 755011

## 2024-02-23 ENCOUNTER — Ambulatory Visit: Payer: Self-pay | Admitting: Cardiovascular Disease

## 2024-03-08 DIAGNOSIS — J449 Chronic obstructive pulmonary disease, unspecified: Secondary | ICD-10-CM | POA: Diagnosis not present

## 2024-03-08 DIAGNOSIS — E785 Hyperlipidemia, unspecified: Secondary | ICD-10-CM | POA: Diagnosis not present

## 2024-03-08 DIAGNOSIS — R634 Abnormal weight loss: Secondary | ICD-10-CM | POA: Diagnosis not present

## 2024-03-08 DIAGNOSIS — I10 Essential (primary) hypertension: Secondary | ICD-10-CM | POA: Diagnosis not present

## 2024-03-08 DIAGNOSIS — M25511 Pain in right shoulder: Secondary | ICD-10-CM | POA: Diagnosis not present

## 2024-03-08 DIAGNOSIS — G629 Polyneuropathy, unspecified: Secondary | ICD-10-CM | POA: Diagnosis not present

## 2024-03-15 DIAGNOSIS — R918 Other nonspecific abnormal finding of lung field: Secondary | ICD-10-CM | POA: Diagnosis not present

## 2024-03-15 DIAGNOSIS — M25511 Pain in right shoulder: Secondary | ICD-10-CM | POA: Diagnosis not present

## 2024-03-15 DIAGNOSIS — J449 Chronic obstructive pulmonary disease, unspecified: Secondary | ICD-10-CM | POA: Diagnosis not present

## 2024-03-15 DIAGNOSIS — R591 Generalized enlarged lymph nodes: Secondary | ICD-10-CM | POA: Diagnosis not present

## 2024-03-15 DIAGNOSIS — S4992XA Unspecified injury of left shoulder and upper arm, initial encounter: Secondary | ICD-10-CM | POA: Diagnosis not present

## 2024-03-15 DIAGNOSIS — S4991XD Unspecified injury of right shoulder and upper arm, subsequent encounter: Secondary | ICD-10-CM | POA: Diagnosis not present

## 2024-03-15 DIAGNOSIS — Z981 Arthrodesis status: Secondary | ICD-10-CM | POA: Diagnosis not present

## 2024-03-24 NOTE — Progress Notes (Signed)
 Remote PPM Transmission

## 2024-04-02 ENCOUNTER — Ambulatory Visit: Payer: Medicare HMO | Admitting: Cardiovascular Disease

## 2024-04-08 ENCOUNTER — Ambulatory Visit: Attending: Cardiovascular Disease | Admitting: Cardiovascular Disease

## 2024-04-08 ENCOUNTER — Encounter: Payer: Self-pay | Admitting: Cardiovascular Disease

## 2024-04-08 VITALS — BP 138/72 | HR 62 | Ht 64.0 in | Wt 142.8 lb

## 2024-04-08 DIAGNOSIS — I480 Paroxysmal atrial fibrillation: Secondary | ICD-10-CM | POA: Diagnosis not present

## 2024-04-08 LAB — CUP PACEART INCLINIC DEVICE CHECK
Date Time Interrogation Session: 20251009103554
Implantable Lead Connection Status: 753985
Implantable Lead Connection Status: 753985
Implantable Lead Implant Date: 20040709
Implantable Lead Implant Date: 20040709
Implantable Lead Location: 753859
Implantable Lead Location: 753860
Implantable Lead Model: 5076
Implantable Lead Model: 5076
Implantable Pulse Generator Implant Date: 20250401

## 2024-04-08 NOTE — Progress Notes (Signed)
    PCP: Carlette Benita Area, MD Primary Cardiologist: Dr Alvan Primary EP:  Dr Kelsie Waylan Jason Yu is a 73 y.o. male who presents today for routine electrophysiology followup.  Since last being seen in our clinic, the patient reports doing reasonably well.    He has chronic SOB due to COPD.  This appears stable.    He underwent generator change on September 30, 2023.  Today, he denies symptoms of palpitations, chest pain, lower extremity edema, dizziness, presyncope, or syncope.  he has no device related complaints -- no new tenderness, drainage, redness.     Physical Exam: There were no vitals filed for this visit.    Gen: Appears comfortable, well-nourished CV: RRR, no dependent edema The device site is normal -- no tenderness, edema, drainage, redness, threatened erosion.  Pulm: breathing easily   Pacemaker interrogation- reviewed in detail today,  See PACEART report       Assessment and Plan:  1. Symptomatic complete heart block Normal pacemaker function  -- battery is at [facility] See Elisabeth Art report he is device dependant today   2. HTN Stable No change required today  3. Afib Discovered on PPM --  Burden <0.1% -- no episodes since the generator change Continue to monitor for now.  I would not consider OAC unless his burden increases    Return in a year  Jason FORBES Furbish, MD 04/08/2024 8:41 AM

## 2024-04-08 NOTE — Patient Instructions (Signed)

## 2024-04-15 ENCOUNTER — Ambulatory Visit: Payer: Self-pay | Admitting: Cardiovascular Disease

## 2024-05-03 ENCOUNTER — Ambulatory Visit: Payer: Medicare HMO

## 2024-05-17 ENCOUNTER — Ambulatory Visit (INDEPENDENT_AMBULATORY_CARE_PROVIDER_SITE_OTHER)

## 2024-05-17 DIAGNOSIS — I480 Paroxysmal atrial fibrillation: Secondary | ICD-10-CM | POA: Diagnosis not present

## 2024-05-17 LAB — CUP PACEART REMOTE DEVICE CHECK
Battery Remaining Longevity: 121 mo
Battery Voltage: 3.11 V
Brady Statistic AP VP Percent: 42.14 %
Brady Statistic AP VS Percent: 0 %
Brady Statistic AS VP Percent: 57.77 %
Brady Statistic AS VS Percent: 0.1 %
Brady Statistic RA Percent Paced: 42.14 %
Brady Statistic RV Percent Paced: 99.9 %
Date Time Interrogation Session: 20251116223204
Implantable Lead Connection Status: 753985
Implantable Lead Connection Status: 753985
Implantable Lead Implant Date: 20040709
Implantable Lead Implant Date: 20040709
Implantable Lead Location: 753859
Implantable Lead Location: 753860
Implantable Lead Model: 5076
Implantable Lead Model: 5076
Implantable Pulse Generator Implant Date: 20250401
Lead Channel Impedance Value: 304 Ohm
Lead Channel Impedance Value: 361 Ohm
Lead Channel Impedance Value: 361 Ohm
Lead Channel Impedance Value: 494 Ohm
Lead Channel Pacing Threshold Amplitude: 0.625 V
Lead Channel Pacing Threshold Amplitude: 1.375 V
Lead Channel Pacing Threshold Pulse Width: 0.4 ms
Lead Channel Pacing Threshold Pulse Width: 0.4 ms
Lead Channel Sensing Intrinsic Amplitude: 3.125 mV
Lead Channel Sensing Intrinsic Amplitude: 3.125 mV
Lead Channel Setting Pacing Amplitude: 1.5 V
Lead Channel Setting Pacing Amplitude: 2.75 V
Lead Channel Setting Pacing Pulse Width: 0.4 ms
Lead Channel Setting Sensing Sensitivity: 5.6 mV
Zone Setting Status: 755011
Zone Setting Status: 755011

## 2024-05-19 NOTE — Progress Notes (Signed)
 Remote PPM Transmission

## 2024-05-31 ENCOUNTER — Ambulatory Visit: Payer: Self-pay | Admitting: Cardiovascular Disease

## 2024-08-02 ENCOUNTER — Ambulatory Visit: Payer: Medicare HMO

## 2024-08-16 ENCOUNTER — Encounter

## 2024-11-01 ENCOUNTER — Ambulatory Visit: Payer: Medicare HMO

## 2024-11-15 ENCOUNTER — Encounter

## 2025-01-31 ENCOUNTER — Ambulatory Visit: Payer: Medicare HMO
